# Patient Record
Sex: Male | Born: 1960
Health system: Southern US, Community
[De-identification: ages and names within clinical notes are randomized; demographics above are authoritative.]

## PROBLEM LIST (undated history)

## (undated) DIAGNOSIS — I712 Thoracic aortic aneurysm, without rupture: Secondary | ICD-10-CM

## (undated) DIAGNOSIS — N200 Calculus of kidney: Secondary | ICD-10-CM

## (undated) DIAGNOSIS — E785 Hyperlipidemia, unspecified: Secondary | ICD-10-CM

## (undated) DIAGNOSIS — I472 Ventricular tachycardia: Secondary | ICD-10-CM

## (undated) DIAGNOSIS — I7121 Aneurysm of the ascending aorta, without rupture: Secondary | ICD-10-CM

## (undated) DIAGNOSIS — R911 Solitary pulmonary nodule: Secondary | ICD-10-CM

## (undated) DIAGNOSIS — I4729 Other ventricular tachycardia: Secondary | ICD-10-CM

## (undated) DIAGNOSIS — I2510 Atherosclerotic heart disease of native coronary artery without angina pectoris: Secondary | ICD-10-CM

## (undated) DIAGNOSIS — N182 Chronic kidney disease, stage 2 (mild): Secondary | ICD-10-CM

## (undated) DIAGNOSIS — C61 Malignant neoplasm of prostate: Secondary | ICD-10-CM

## (undated) DIAGNOSIS — I5020 Unspecified systolic (congestive) heart failure: Secondary | ICD-10-CM

## (undated) DIAGNOSIS — I10 Essential (primary) hypertension: Secondary | ICD-10-CM

## (undated) HISTORY — PX: PROSTATE BIOPSY: SHX241

## (undated) HISTORY — PX: INGUINAL HERNIA REPAIR: SUR1180

## (undated) HISTORY — DX: Solitary pulmonary nodule: R91.1

## (undated) HISTORY — DX: Ventricular tachycardia: I47.2

## (undated) HISTORY — DX: Unspecified systolic (congestive) heart failure: I50.20

## (undated) HISTORY — DX: Other ventricular tachycardia: I47.29

## (undated) HISTORY — DX: Aneurysm of the ascending aorta, without rupture: I71.21

## (undated) HISTORY — PX: CYSTOSCOPY WITH URETEROSCOPY, STONE BASKETRY AND STENT PLACEMENT: SHX6378

## (undated) HISTORY — DX: Atherosclerotic heart disease of native coronary artery without angina pectoris: I25.10

## (undated) HISTORY — DX: Thoracic aortic aneurysm, without rupture: I71.2

---

## 2016-09-07 ENCOUNTER — Emergency Department (HOSPITAL_BASED_OUTPATIENT_CLINIC_OR_DEPARTMENT_OTHER): Payer: BLUE CROSS/BLUE SHIELD

## 2016-09-07 ENCOUNTER — Encounter (HOSPITAL_BASED_OUTPATIENT_CLINIC_OR_DEPARTMENT_OTHER): Payer: Self-pay | Admitting: Emergency Medicine

## 2016-09-07 ENCOUNTER — Emergency Department (HOSPITAL_BASED_OUTPATIENT_CLINIC_OR_DEPARTMENT_OTHER)
Admission: EM | Admit: 2016-09-07 | Discharge: 2016-09-08 | Disposition: A | Discharge status: Other Acute Inpatient Hospital | Payer: BLUE CROSS/BLUE SHIELD | Attending: Emergency Medicine | Admitting: Emergency Medicine

## 2016-09-07 DIAGNOSIS — I6782 Cerebral ischemia: Secondary | ICD-10-CM | POA: Insufficient documentation

## 2016-09-07 DIAGNOSIS — Z87891 Personal history of nicotine dependence: Secondary | ICD-10-CM | POA: Diagnosis not present

## 2016-09-07 DIAGNOSIS — R112 Nausea with vomiting, unspecified: Secondary | ICD-10-CM | POA: Diagnosis present

## 2016-09-07 DIAGNOSIS — I161 Hypertensive emergency: Secondary | ICD-10-CM | POA: Diagnosis not present

## 2016-09-07 HISTORY — DX: Essential (primary) hypertension: I10

## 2016-09-07 LAB — CBC WITH DIFFERENTIAL/PLATELET
BASOS ABS: 0 10*3/uL (ref 0.0–0.1)
BASOS PCT: 0 %
EOS PCT: 0 %
Eosinophils Absolute: 0 10*3/uL (ref 0.0–0.7)
HCT: 42.6 % (ref 39.0–52.0)
Hemoglobin: 15.3 g/dL (ref 13.0–17.0)
LYMPHS PCT: 8 %
Lymphs Abs: 1.1 10*3/uL (ref 0.7–4.0)
MCH: 34.2 pg — ABNORMAL HIGH (ref 26.0–34.0)
MCHC: 35.9 g/dL (ref 30.0–36.0)
MCV: 95.1 fL (ref 78.0–100.0)
MONO ABS: 0.5 10*3/uL (ref 0.1–1.0)
Monocytes Relative: 4 %
NEUTROS ABS: 12.7 10*3/uL — AB (ref 1.7–7.7)
Neutrophils Relative %: 88 %
PLATELETS: 316 10*3/uL (ref 150–400)
RBC: 4.48 MIL/uL (ref 4.22–5.81)
RDW: 12.8 % (ref 11.5–15.5)
WBC: 14.4 10*3/uL — AB (ref 4.0–10.5)

## 2016-09-07 LAB — COMPREHENSIVE METABOLIC PANEL
ALBUMIN: 4.5 g/dL (ref 3.5–5.0)
ALT: 12 U/L — AB (ref 17–63)
AST: 24 U/L (ref 15–41)
Alkaline Phosphatase: 67 U/L (ref 38–126)
Anion gap: 12 (ref 5–15)
BUN: 10 mg/dL (ref 6–20)
CHLORIDE: 100 mmol/L — AB (ref 101–111)
CO2: 25 mmol/L (ref 22–32)
CREATININE: 1.17 mg/dL (ref 0.61–1.24)
Calcium: 9.9 mg/dL (ref 8.9–10.3)
GFR calc Af Amer: >60 mL/min (ref >60)
GLUCOSE: 142 mg/dL — AB (ref 65–99)
POTASSIUM: 3.7 mmol/L (ref 3.5–5.1)
Sodium: 137 mmol/L (ref 135–145)
Total Bilirubin: 0.8 mg/dL (ref 0.3–1.2)
Total Protein: 8.6 g/dL — ABNORMAL HIGH (ref 6.5–8.1)

## 2016-09-07 LAB — TROPONIN I
Troponin I: 0.06 ng/mL (ref <0.03)
Troponin I: 0.1 ng/mL (ref <0.03)

## 2016-09-07 LAB — LIPASE, BLOOD: LIPASE: 20 U/L (ref 11–51)

## 2016-09-07 MED ORDER — NICARDIPINE HCL IN NACL 20-0.86 MG/200ML-% IV SOLN: 3.0000 mg/h | Freq: Once | INTRAVENOUS | Status: DC

## 2016-09-07 MED ORDER — NITROGLYCERIN 2 % TD OINT
1.0000 [in_us] | TOPICAL_OINTMENT | Freq: Once | TRANSDERMAL | Status: AC
  Administered 2016-09-07: 1 [in_us] via TOPICAL
  Filled 2016-09-07: qty 1

## 2016-09-07 MED ORDER — ONDANSETRON HCL 4 MG/2ML IJ SOLN
4.0000 mg | Freq: Once | INTRAMUSCULAR | Status: AC
  Administered 2016-09-07: 4 mg via INTRAVENOUS
  Filled 2016-09-07: qty 2

## 2016-09-07 MED ORDER — HEPARIN (PORCINE) IN NACL 100-0.45 UNIT/ML-% IJ SOLN
1000.0000 [IU]/h | INTRAMUSCULAR | Status: DC
  Administered 2016-09-07: 1000 [IU]/h via INTRAVENOUS
  Filled 2016-09-07: qty 250

## 2016-09-07 MED ORDER — HEPARIN SODIUM (PORCINE) 5000 UNIT/ML IJ SOLN
INTRAMUSCULAR | Status: AC
  Filled 2016-09-07: qty 1

## 2016-09-07 MED ORDER — HEPARIN BOLUS VIA INFUSION
4000.0000 [IU] | Freq: Once | INTRAVENOUS | Status: AC
  Administered 2016-09-07: 4000 [IU] via INTRAVENOUS

## 2016-09-07 MED ORDER — ONDANSETRON HCL 4 MG/2ML IJ SOLN
4.0000 mg | Freq: Once | INTRAMUSCULAR | Status: AC
  Administered 2016-09-07: 4 mg via INTRAVENOUS

## 2016-09-07 MED ORDER — ACETAMINOPHEN 325 MG PO TABS: 650.0000 mg | ORAL_TABLET | Freq: Once | ORAL | Status: DC

## 2016-09-07 MED ORDER — NITROGLYCERIN IN D5W 200-5 MCG/ML-% IV SOLN
0.0000 ug/min | Freq: Once | INTRAVENOUS | Status: AC
  Administered 2016-09-07: 5 ug/min via INTRAVENOUS
  Filled 2016-09-07: qty 250

## 2016-09-07 MED ORDER — ASPIRIN 81 MG PO CHEW
324.0000 mg | CHEWABLE_TABLET | Freq: Once | ORAL | Status: AC
  Administered 2016-09-07: 324 mg via ORAL
  Filled 2016-09-07: qty 4

## 2016-09-07 MED ORDER — NITROGLYCERIN 0.4 MG SL SUBL
0.4000 mg | SUBLINGUAL_TABLET | Freq: Once | SUBLINGUAL | Status: AC
  Administered 2016-09-07: 0.4 mg via SUBLINGUAL
  Filled 2016-09-07: qty 1

## 2016-09-07 MED ORDER — ONDANSETRON HCL 4 MG/2ML IJ SOLN
INTRAMUSCULAR | Status: AC
  Filled 2016-09-07: qty 2

## 2016-09-07 NOTE — ED Notes (Signed)
States n/v relieved.

## 2016-09-07 NOTE — ED Notes (Signed)
Patient is resting comfortably. 

## 2016-09-07 NOTE — ED Notes (Signed)
Patient denies pain.

## 2016-09-07 NOTE — ED Triage Notes (Signed)
Pt in c/o emesis all day, denies abd pain. No emesis at this time. Pt alert, interactive, ambulatory in NAD.

## 2016-09-07 NOTE — ED Provider Notes (Signed)
[place] DEPT [facility] Provider Note   CSN: [id number] Arrival date & time: 09/07/16  1900  By signing my name below, I, Arianna Nassar, attest that this documentation has been prepared under the direction and in the presence of Fatima Blank, MD.  Electronically Signed: Julien Nordmann, ED Scribe. 09/07/16. 8:16 PM.    History   Chief Complaint Chief Complaint  Patient presents with  . Emesis    The history is provided by the patient. No language interpreter was used.   HPI Comments: Darius Rivers is a 55 y.o. male who has a PMhx of HTN presents to the Emergency Department complaining of sudden onset, intermittent vomiting that started this morning. He has been having associated chills and nausea. He says the last time he had a full meal was yesterday and he tried to consume fluids earlier today but vomited it back up. Pt has also had high blood pressure recently. He has not taken his blood pressure medication because he ran out one week ago. Pt has not been complaint with checking his blood pressure and is unsure what his blood pressure normally runs. Denies dizziness, headache, abdominal pain, chest pain, shortness of breath, fever, diarrhea, abdominal distension, leg swelling, or visual disturbances. Further denies hx of DM and hx of MI.  Past Medical History:  Diagnosis Date  . Hypertension     There are no active problems to display for this patient.   History reviewed. No pertinent surgical history.     Home Medications    Prior to Admission medications   Not on File    Family History History reviewed. No pertinent family history.  Social History Social History  Substance Use Topics  . Smoking status: Former Research scientist (life sciences)  . Smokeless tobacco: Not on file  . Alcohol use Yes     Allergies   Patient has no known allergies.   Review of Systems Review of Systems  All other systems reviewed and are negative.   A complete 10 system review of systems  was obtained and all systems are negative except as noted in the HPI and PMH.   Physical Exam Triage Vital Signs BP (!) 237/126 Comment: Pt states did not take BP meds this am due to vomiting  Pulse 65   Temp 98.3 F (36.8 C)   Resp (!) 32   Wt 180 lb (81.6 kg)   SpO2 100%   Recent vitals taken: Blood pressure (!) 247/109, pulse 70, temperature 98.3 F (36.8 C), resp. rate 21, weight 180 lb (81.6 kg), SpO2 100 %.   Physical Exam  Constitutional: He is oriented to person, place, and time. He appears well-developed and well-nourished. No distress.  HENT:  Head: Normocephalic and atraumatic.  Nose: Nose normal.  Eyes: Conjunctivae and EOM are normal. Pupils are equal, round, and reactive to light. Right eye exhibits no discharge. Left eye exhibits no discharge. No scleral icterus.  Neck: Normal range of motion. Neck supple.  Cardiovascular: Normal rate and regular rhythm.  Exam reveals no gallop and no friction rub.   No murmur heard. Pulmonary/Chest: Effort normal and breath sounds normal. No stridor. No respiratory distress. He has no rales.  Abdominal: Soft. He exhibits no distension. There is no tenderness.  Musculoskeletal: He exhibits no edema or tenderness.  Neurological: He is alert and oriented to person, place, and time. He displays normal reflexes.  Mental Status: Alert and oriented to person, place, and time. Attention and concentration normal. Speech clear. Recent memory is intac  Cranial Nerves  II Visual Fields: Intact to confrontation. Visual fields intact. III, IV, VI: Pupils equal and reactive to light and near. Full eye movement without nystagmus  V Facial Sensation: Normal. No weakness of masticatory muscles  VII: No facial weakness or asymmetry  VIII Auditory Acuity: Grossly normal  IX/X: The uvula is midline; the palate elevates symmetrically  XI: Normal sternocleidomastoid and trapezius strength  XII: The tongue is midline. No atrophy or fasciculations.    Motor System: Muscle Strength: 5/5 and symmetric in the upper and lower extremities. No pronation or drift.  Muscle Tone: Tone and muscle bulk are normal in the upper and lower extremities.   Reflexes: DTRs: 2+ and symmetrical in all four extremities. Plantar responses are flexor bilaterally.  Coordination: Intact finger-to-nose, heel-to-shin, and rapid alternating movements. No tremor.  Sensation: Intact to light touch, and pinprick. Negative Romberg test.    Skin: Skin is warm and dry. No rash noted. He is not diaphoretic. No erythema.  Psychiatric: He has a normal mood and affect.  Nursing note and vitals reviewed.    ED Treatments / Results  DIAGNOSTIC STUDIES: Oxygen Saturation is 100% on RA, normal by my interpretation.  COORDINATION OF CARE:  8:13 PM Discussed treatment plan with pt at bedside and pt agreed to plan.  Labs (all labs ordered are listed, but only abnormal results are displayed) Labs Reviewed  CBC WITH DIFFERENTIAL/PLATELET - Abnormal; Notable for the following:       Result Value   WBC 14.4 (*)    MCH 34.2 (*)    Neutro Abs 12.7 (*)    All other components within normal limits  COMPREHENSIVE METABOLIC PANEL - Abnormal; Notable for the following:    Chloride 100 (*)    Glucose, Bld 142 (*)    Total Protein 8.6 (*)    ALT 12 (*)    All other components within normal limits  TROPONIN I - Abnormal; Notable for the following:    Troponin I 0.06 (*)    All other components within normal limits  TROPONIN I - Abnormal; Notable for the following:    Troponin I 0.10 (*)    All other components within normal limits  LIPASE, BLOOD    EKG  EKG Interpretation  Date/Time:  Sunday September 07 2016 20:19:05 EST Ventricular Rate:  68 PR Interval:    QRS Duration: 116 QT Interval:  447 QTC Calculation: 476 R Axis:   -58 Text Interpretation:  Sinus rhythm Probable left atrial enlargement Incomplete right bundle branch block LVH with IVCD, LAD and secondary  repol abnrm Anterior ST elevation, probably due to LVH Borderline prolonged QT interval No old tracing to compare Confirmed by [facility] MD, Turkessa Ostrom [phone]) on 09/07/2016 10:14:28 PM       Radiology Ct Head Wo Contrast  Result Date: 09/07/2016 CLINICAL DATA:  Hypertension, emesis all day EXAM: CT HEAD WITHOUT CONTRAST TECHNIQUE: Contiguous axial images were obtained from the base of the skull through the vertex without intravenous contrast. COMPARISON:  None. FINDINGS: Study had to be repeated several times due to patient motion causing artifacts. Brain: Pearline Cables- white matter distinction is maintained without acute intracranial hemorrhage, midline shift or edema. Ventricles are normal for age. There appears to be minimal small vessel ischemic change of periventricular white matter and probable centrum semiovale lacunar infarct on the left versus a small CSF space. Vascular: No hyperdense vessel or unexpected calcification. Skull: Normal. Negative for fracture or focal lesion. Sinuses/Orbits: Mucous retention cysts noted of  the right maxillary sinus. The orbits appear symmetric without retrobulbar abnormalities. Other: None IMPRESSION: Limited study due to patient motion. No acute intracranial abnormality identified. Likely chronic small vessel ischemic changes of periventricular white matter. Electronically Signed   By: Ashley Royalty M.D.   On: 09/07/2016 23:11    Procedures Procedures (including critical care time) CRITICAL CARE Performed by: Grayce Sessions Chamberlain Steinborn Total critical care time: 35 minutes Critical care time was exclusive of separately billable procedures and treating other patients. Critical care was necessary to treat or prevent imminent or life-threatening deterioration. Critical care was time spent personally by me on the following activities: development of treatment plan with patient and/or surrogate as well as nursing, discussions with consultants, evaluation of patient's response to  treatment, examination of patient, obtaining history from patient or surrogate, ordering and performing treatments and interventions, ordering and review of laboratory studies, ordering and review of radiographic studies, pulse oximetry and re-evaluation of patient's condition.   Medications Ordered in ED Medications  ondansetron (ZOFRAN) 4 MG/2ML injection (not administered)  heparin ADULT infusion 100 units/mL (25000 units/254mL sodium chloride 0.45%) (1,000 Units/hr Intravenous Transfusing/Transfer 09/08/16 0013)  heparin 5000 UNIT/ML injection (not administered)  nicardipine (CARDENE) 20mg  in 0.86% saline 28ml IV infusion (0.1 mg/ml) (not administered)  ondansetron (ZOFRAN) injection 4 mg (4 mg Intravenous Given 09/07/16 2059)  aspirin chewable tablet 324 mg (324 mg Oral Given 09/07/16 2113)  nitroGLYCERIN (NITROSTAT) SL tablet 0.4 mg (0.4 mg Sublingual Given 09/07/16 2114)  nitroGLYCERIN (NITROGLYN) 2 % ointment 1 inch (1 inch Topical Given 09/07/16 2114)  nitroGLYCERIN 50 mg in dextrose 5 % 250 mL (0.2 mg/mL) infusion (140 mcg/min Intravenous Transfusing/Transfer 09/08/16 0012)  heparin bolus via infusion 4,000 Units (4,000 Units Intravenous Bolus from Bag 09/07/16 2334)  ondansetron (ZOFRAN) injection 4 mg (4 mg Intravenous Given 09/07/16 2304)     Initial Impression / Assessment and Plan / ED Course  I have reviewed the triage vital signs and the nursing notes.  Pertinent labs & imaging results that were available during my care of the patient were reviewed by me and considered in my medical decision making (see chart for details).  Clinical Course     Hypertensive emergency. Nitroglycerin drip initiated. The pressures slowly trending down however is still requires titration. Given the elevated troponins, 324 mg of aspirin given. CT head without acute hemorrhagic stroke. heparin drip initiated.  Patient requested to be admitted to [facility]. Discussed case with the hospitalist  who will admit the patient to intensive care unit.  Final Clinical Impressions(s) / ED Diagnoses   Final diagnoses:  Hypertensive emergency  Non-intractable vomiting with nausea, unspecified vomiting type    Disposition: Transfer  Condition: serious    Fatima Blank, MD 09/08/16 [phone]

## 2016-09-07 NOTE — Progress Notes (Signed)
ANTICOAGULATION CONSULT NOTE - Initial Consult  Pharmacy Consult for Heparin Indication: chest pain/ACS  No Known Allergies  Patient Measurements: Weight: 180 lb (81.6 kg) Heparin Dosing Weight: 82 kg  Vital Signs: Temp: 98.3 F (36.8 C) (11/05 1904) BP: 251/118 (11/05 2202) Pulse Rate: 64 (11/05 2145)  Labs:  Recent Labs  09/07/16 2010  HGB 15.3  HCT 42.6  PLT 316  CREATININE 1.17  TROPONINI 0.06*    CrCl cannot be calculated (Unknown ideal weight.).   Medical History: Past Medical History:  Diagnosis Date  . Hypertension     Medications:  Awaiting home med rec  Assessment: 55 y.o. M presents to [facility] with emesis. Pt with elevated trop to 0.06. To begin heparin for r/o ACS. No AC PTA. CBC ok on admission.  Goal of Therapy:  Heparin level 0.3-0.7 units/ml Monitor platelets by anticoagulation protocol: Yes   Plan:  Heparin IV bolus 4000 units Heparin gtt at 1000 units/hr Will f/u heparin level in 6 hours Daily heparin level and CBC  Sherlon Handing, PharmD, BCPS Clinical pharmacist, pager [phone] 09/07/2016,10:17 PM

## 2016-09-07 NOTE — ED Notes (Signed)
Nitro gtt increased to 73mcg/min, 18ml/hr

## 2016-09-07 NOTE — ED Notes (Signed)
Nitropaste removed from chest.

## 2016-09-07 NOTE — ED Notes (Signed)
Denies pain, states has not taken BP med in a week?, has ran out . N/V today

## 2016-09-07 NOTE — ED Notes (Addendum)
MD aware that there was an increase in pt's troponin. No further orders received. (Troponin 0.10)

## 2016-09-07 NOTE — ED Notes (Signed)
MD aware of positive troponin

## 2016-09-07 NOTE — ED Notes (Signed)
Back to room from Ct, with nurse on cardiac monitor

## 2016-09-08 NOTE — ED Notes (Signed)
Pt left with [facility].  No acute distress noted.  Pt denies HA, nausea.

## 2018-11-05 ENCOUNTER — Encounter (HOSPITAL_BASED_OUTPATIENT_CLINIC_OR_DEPARTMENT_OTHER): Payer: Self-pay

## 2018-11-05 ENCOUNTER — Emergency Department (HOSPITAL_BASED_OUTPATIENT_CLINIC_OR_DEPARTMENT_OTHER)
Admission: EM | Admit: 2018-11-05 | Discharge: 2018-11-05 | Disposition: A | Discharge status: Home / Self Care | Payer: BLUE CROSS/BLUE SHIELD | Attending: Emergency Medicine | Admitting: Emergency Medicine

## 2018-11-05 ENCOUNTER — Other Ambulatory Visit: Payer: Self-pay

## 2018-11-05 DIAGNOSIS — R112 Nausea with vomiting, unspecified: Secondary | ICD-10-CM | POA: Diagnosis not present

## 2018-11-05 DIAGNOSIS — Z87891 Personal history of nicotine dependence: Secondary | ICD-10-CM | POA: Insufficient documentation

## 2018-11-05 DIAGNOSIS — R197 Diarrhea, unspecified: Secondary | ICD-10-CM | POA: Diagnosis not present

## 2018-11-05 DIAGNOSIS — I10 Essential (primary) hypertension: Secondary | ICD-10-CM | POA: Diagnosis not present

## 2018-11-05 LAB — URINALYSIS, ROUTINE W REFLEX MICROSCOPIC
Bilirubin Urine: NEGATIVE
Glucose, UA: NEGATIVE mg/dL
Ketones, ur: 15 mg/dL — AB
Leukocytes, UA: NEGATIVE
Nitrite: NEGATIVE
Protein, ur: NEGATIVE mg/dL
Specific Gravity, Urine: 1.015 (ref 1.005–1.030)
pH: 7.5 (ref 5.0–8.0)

## 2018-11-05 LAB — CBC WITH DIFFERENTIAL/PLATELET
Abs Immature Granulocytes: 0.04 10*3/uL (ref 0.00–0.07)
Basophils Absolute: 0 10*3/uL (ref 0.0–0.1)
Basophils Relative: 0 %
Eosinophils Absolute: 0 10*3/uL (ref 0.0–0.5)
Eosinophils Relative: 0 %
HCT: 38.2 % — ABNORMAL LOW (ref 39.0–52.0)
Hemoglobin: 12.8 g/dL — ABNORMAL LOW (ref 13.0–17.0)
Immature Granulocytes: 0 %
Lymphocytes Relative: 17 %
Lymphs Abs: 1.6 10*3/uL (ref 0.7–4.0)
MCH: 33.2 pg (ref 26.0–34.0)
MCHC: 33.5 g/dL (ref 30.0–36.0)
MCV: 99 fL (ref 80.0–100.0)
Monocytes Absolute: 0.5 10*3/uL (ref 0.1–1.0)
Monocytes Relative: 5 %
Neutro Abs: 7.6 10*3/uL (ref 1.7–7.7)
Neutrophils Relative %: 78 %
Platelets: 294 10*3/uL (ref 150–400)
RBC: 3.86 MIL/uL — ABNORMAL LOW (ref 4.22–5.81)
RDW: 12.8 % (ref 11.5–15.5)
WBC: 9.8 10*3/uL (ref 4.0–10.5)
nRBC: 0 % (ref 0.0–0.2)

## 2018-11-05 LAB — COMPREHENSIVE METABOLIC PANEL
ALT: 13 U/L (ref 0–44)
AST: 17 U/L (ref 15–41)
Albumin: 4.1 g/dL (ref 3.5–5.0)
Alkaline Phosphatase: 59 U/L (ref 38–126)
Anion gap: 9 (ref 5–15)
BUN: 11 mg/dL (ref 6–20)
CO2: 21 mmol/L — ABNORMAL LOW (ref 22–32)
Calcium: 9.5 mg/dL (ref 8.9–10.3)
Chloride: 108 mmol/L (ref 98–111)
Creatinine, Ser: 1.01 mg/dL (ref 0.61–1.24)
GFR calc Af Amer: >60 mL/min (ref >60)
GFR calc non Af Amer: >60 mL/min (ref >60)
Glucose, Bld: 136 mg/dL — ABNORMAL HIGH (ref 70–99)
Potassium: 3.1 mmol/L — ABNORMAL LOW (ref 3.5–5.1)
Sodium: 138 mmol/L (ref 135–145)
Total Bilirubin: 0.7 mg/dL (ref 0.3–1.2)
Total Protein: 7.9 g/dL (ref 6.5–8.1)

## 2018-11-05 LAB — URINALYSIS, MICROSCOPIC (REFLEX)

## 2018-11-05 LAB — LIPASE, BLOOD: Lipase: 23 U/L (ref 11–51)

## 2018-11-05 MED ORDER — ONDANSETRON HCL 4 MG/2ML IJ SOLN
4.0000 mg | Freq: Once | INTRAMUSCULAR | Status: AC
  Administered 2018-11-05: 4 mg via INTRAVENOUS
  Filled 2018-11-05: qty 2

## 2018-11-05 MED ORDER — PROMETHAZINE HCL 25 MG PO TABS: 25.0000 mg | ORAL_TABLET | Freq: Four times a day (QID) | ORAL | 0 refills | Status: DC | PRN

## 2018-11-05 MED ORDER — SODIUM CHLORIDE 0.9 % IV BOLUS
1000.0000 mL | Freq: Once | INTRAVENOUS | Status: AC
  Administered 2018-11-05: 1000 mL via INTRAVENOUS

## 2018-11-05 NOTE — Discharge Instructions (Addendum)
Return here as needed. Follow up with your doctor. Slowly increase your fluid intake. °

## 2018-11-05 NOTE — ED Provider Notes (Signed)
[place] EMERGENCY DEPARTMENT Provider Note   CSN: [id number] Arrival date & time: 11/05/18  1243     History   Chief Complaint Chief Complaint  Patient presents with  . Abdominal Pain    HPI Darius Rivers is a 58 y.o. male.  HPI Patient presents to the emergency department with nausea vomiting diarrhea that started last night.  The patient states that he not take any medication prior to arrival for symptoms.  Patient states that feels like he vomited up his home medications.  Patient states that nothing seems to make the condition better or worse.  Patient does not have abdominal pain despite with the nursing triage note states.  The patient denies chest pain, shortness of breath, headache,blurred vision, neck pain, fever, cough, weakness, numbness, dizziness, anorexia, edema, abdominal pain,  rash, back pain, dysuria, hematemesis, bloody stool, near syncope, or syncope. Past Medical History:  Diagnosis Date  . Hypertension     There are no active problems to display for this patient.   History reviewed. No pertinent surgical history.      Home Medications    Prior to Admission medications   Not on File    Family History No family history on file.  Social History Social History   Tobacco Use  . Smoking status: Former Research scientist (life sciences)  . Smokeless tobacco: Never Used  Substance Use Topics  . Alcohol use: Yes    Comment: occ  . Drug use: Never     Allergies   Patient has no known allergies.   Review of Systems Review of Systems All other systems negative except as documented in the HPI. All pertinent positives and negatives as reviewed in the HPI.  Physical Exam Updated Vital Signs BP (!) 180/86 (BP Location: Left Arm)   Pulse (!) 56   Temp 98.7 F (37.1 C) (Oral)   Resp 20   Ht 6\' 4"  (1.93 m)   Wt 83.5 kg   SpO2 100%   BMI 22.40 kg/m   Physical Exam Vitals signs and nursing note reviewed.  Constitutional:      General: He is not in  acute distress.    Appearance: He is well-developed.  HENT:     Head: Normocephalic and atraumatic.  Eyes:     Pupils: Pupils are equal, round, and reactive to light.  Neck:     Musculoskeletal: Normal range of motion and neck supple.  Cardiovascular:     Rate and Rhythm: Normal rate and regular rhythm.     Heart sounds: Normal heart sounds. No murmur. No friction rub. No gallop.   Pulmonary:     Effort: Pulmonary effort is normal. No respiratory distress.     Breath sounds: Normal breath sounds. No wheezing.  Abdominal:     General: Bowel sounds are normal. There is no distension.     Palpations: Abdomen is soft.     Tenderness: There is no abdominal tenderness.     Hernia: No hernia is present.  Skin:    General: Skin is warm and dry.     Capillary Refill: Capillary refill takes less than 2 seconds.     Findings: No erythema or rash.  Neurological:     Mental Status: He is alert and oriented to person, place, and time.     Motor: No abnormal muscle tone.     Coordination: Coordination normal.  Psychiatric:        Behavior: Behavior normal.      ED Treatments / Results  Labs (all labs ordered are listed, but only abnormal results are displayed) Labs Reviewed  URINALYSIS, ROUTINE W REFLEX MICROSCOPIC - Abnormal; Notable for the following components:      Result Value   Hgb urine dipstick MODERATE (*)    Ketones, ur 15 (*)    All other components within normal limits  COMPREHENSIVE METABOLIC PANEL - Abnormal; Notable for the following components:   Potassium 3.1 (*)    CO2 21 (*)    Glucose, Bld 136 (*)    All other components within normal limits  CBC WITH DIFFERENTIAL/PLATELET - Abnormal; Notable for the following components:   RBC 3.86 (*)    Hemoglobin 12.8 (*)    HCT 38.2 (*)    All other components within normal limits  URINALYSIS, MICROSCOPIC (REFLEX) - Abnormal; Notable for the following components:   Bacteria, UA RARE (*)    All other components within  normal limits  LIPASE, BLOOD    EKG None  Radiology No results found.  Procedures Procedures (including critical care time)  Medications Ordered in ED Medications  sodium chloride 0.9 % bolus 1,000 mL (0 mLs Intravenous Stopped 11/05/18 1658)  ondansetron (ZOFRAN) injection 4 mg (4 mg Intravenous Given 11/05/18 1406)     Initial Impression / Assessment and Plan / ED Course  I have reviewed the triage vital signs and the nursing notes.  Pertinent labs & imaging results that were available during my care of the patient were reviewed by me and considered in my medical decision making (see chart for details).     Patient be treated for gastroenteritis told return here as needed.  Patient agrees the plan and all questions were answered.  Patient has tolerated oral fluids here in the emergency department he received IV fluids and antiemetics which he states is resolved his nausea and vomiting.  Final Clinical Impressions(s) / ED Diagnoses   Final diagnoses:  None    ED Discharge Orders    None       Rebeca Allegra 11/05/18 [place], MD 11/07/18 [phone]

## 2018-11-05 NOTE — ED Notes (Signed)
Pt given sprite for PO challenge

## 2018-11-05 NOTE — ED Notes (Signed)
Pt reports he feels better from medication. No longer feels nauseous.

## 2018-11-05 NOTE — ED Triage Notes (Signed)
C/o abd pain, n/v day2-NAD-steady slow gait

## 2018-11-10 ENCOUNTER — Emergency Department (HOSPITAL_BASED_OUTPATIENT_CLINIC_OR_DEPARTMENT_OTHER): Payer: BLUE CROSS/BLUE SHIELD

## 2018-11-10 ENCOUNTER — Inpatient Hospital Stay (HOSPITAL_BASED_OUTPATIENT_CLINIC_OR_DEPARTMENT_OTHER)
Admission: EM | Admit: 2018-11-10 | Discharge: 2018-11-15 | DRG: 682 | Disposition: A | Discharge status: Home / Self Care | Payer: BLUE CROSS/BLUE SHIELD | Attending: Internal Medicine | Admitting: Internal Medicine

## 2018-11-10 ENCOUNTER — Other Ambulatory Visit: Payer: Self-pay

## 2018-11-10 ENCOUNTER — Encounter (HOSPITAL_BASED_OUTPATIENT_CLINIC_OR_DEPARTMENT_OTHER): Payer: Self-pay

## 2018-11-10 DIAGNOSIS — I351 Nonrheumatic aortic (valve) insufficiency: Secondary | ICD-10-CM | POA: Diagnosis present

## 2018-11-10 DIAGNOSIS — E1122 Type 2 diabetes mellitus with diabetic chronic kidney disease: Secondary | ICD-10-CM | POA: Diagnosis present

## 2018-11-10 DIAGNOSIS — N183 Chronic kidney disease, stage 3 unspecified: Secondary | ICD-10-CM

## 2018-11-10 DIAGNOSIS — I471 Supraventricular tachycardia: Secondary | ICD-10-CM | POA: Diagnosis present

## 2018-11-10 DIAGNOSIS — Z87442 Personal history of urinary calculi: Secondary | ICD-10-CM

## 2018-11-10 DIAGNOSIS — I5020 Unspecified systolic (congestive) heart failure: Secondary | ICD-10-CM

## 2018-11-10 DIAGNOSIS — I712 Thoracic aortic aneurysm, without rupture: Secondary | ICD-10-CM | POA: Diagnosis present

## 2018-11-10 DIAGNOSIS — I428 Other cardiomyopathies: Secondary | ICD-10-CM | POA: Diagnosis present

## 2018-11-10 DIAGNOSIS — N179 Acute kidney failure, unspecified: Principal | ICD-10-CM | POA: Diagnosis present

## 2018-11-10 DIAGNOSIS — Z87891 Personal history of nicotine dependence: Secondary | ICD-10-CM

## 2018-11-10 DIAGNOSIS — I9589 Other hypotension: Secondary | ICD-10-CM

## 2018-11-10 DIAGNOSIS — R7989 Other specified abnormal findings of blood chemistry: Secondary | ICD-10-CM

## 2018-11-10 DIAGNOSIS — I472 Ventricular tachycardia: Secondary | ICD-10-CM

## 2018-11-10 DIAGNOSIS — I951 Orthostatic hypotension: Secondary | ICD-10-CM | POA: Diagnosis present

## 2018-11-10 DIAGNOSIS — E861 Hypovolemia: Secondary | ICD-10-CM

## 2018-11-10 DIAGNOSIS — I5022 Chronic systolic (congestive) heart failure: Secondary | ICD-10-CM | POA: Diagnosis present

## 2018-11-10 DIAGNOSIS — N200 Calculus of kidney: Secondary | ICD-10-CM | POA: Diagnosis present

## 2018-11-10 DIAGNOSIS — I2510 Atherosclerotic heart disease of native coronary artery without angina pectoris: Secondary | ICD-10-CM | POA: Diagnosis present

## 2018-11-10 DIAGNOSIS — E872 Acidosis, unspecified: Secondary | ICD-10-CM

## 2018-11-10 DIAGNOSIS — I5042 Chronic combined systolic (congestive) and diastolic (congestive) heart failure: Secondary | ICD-10-CM

## 2018-11-10 DIAGNOSIS — I714 Abdominal aortic aneurysm, without rupture: Secondary | ICD-10-CM | POA: Diagnosis present

## 2018-11-10 DIAGNOSIS — Z8546 Personal history of malignant neoplasm of prostate: Secondary | ICD-10-CM

## 2018-11-10 DIAGNOSIS — R42 Dizziness and giddiness: Secondary | ICD-10-CM | POA: Diagnosis not present

## 2018-11-10 DIAGNOSIS — R778 Other specified abnormalities of plasma proteins: Secondary | ICD-10-CM

## 2018-11-10 DIAGNOSIS — I959 Hypotension, unspecified: Secondary | ICD-10-CM

## 2018-11-10 DIAGNOSIS — I4729 Other ventricular tachycardia: Secondary | ICD-10-CM

## 2018-11-10 DIAGNOSIS — E785 Hyperlipidemia, unspecified: Secondary | ICD-10-CM | POA: Diagnosis present

## 2018-11-10 DIAGNOSIS — I130 Hypertensive heart and chronic kidney disease with heart failure and stage 1 through stage 4 chronic kidney disease, or unspecified chronic kidney disease: Secondary | ICD-10-CM | POA: Diagnosis present

## 2018-11-10 DIAGNOSIS — R571 Hypovolemic shock: Secondary | ICD-10-CM | POA: Diagnosis present

## 2018-11-10 DIAGNOSIS — E871 Hypo-osmolality and hyponatremia: Secondary | ICD-10-CM | POA: Diagnosis present

## 2018-11-10 DIAGNOSIS — E876 Hypokalemia: Secondary | ICD-10-CM | POA: Diagnosis present

## 2018-11-10 HISTORY — DX: Malignant neoplasm of prostate: C61

## 2018-11-10 HISTORY — DX: Hyperlipidemia, unspecified: E78.5

## 2018-11-10 HISTORY — DX: Thoracic aortic aneurysm, without rupture: I71.2

## 2018-11-10 HISTORY — DX: Calculus of kidney: N20.0

## 2018-11-10 HISTORY — DX: Unspecified systolic (congestive) heart failure: I50.20

## 2018-11-10 HISTORY — DX: Chronic kidney disease, stage 2 (mild): N18.2

## 2018-11-10 LAB — COMPREHENSIVE METABOLIC PANEL
ALT: 12 U/L (ref 0–44)
AST: 16 U/L (ref 15–41)
Albumin: 4.1 g/dL (ref 3.5–5.0)
Alkaline Phosphatase: 53 U/L (ref 38–126)
Anion gap: 13 (ref 5–15)
BUN: 61 mg/dL — ABNORMAL HIGH (ref 6–20)
CALCIUM: 9.2 mg/dL (ref 8.9–10.3)
CO2: 22 mmol/L (ref 22–32)
Chloride: 96 mmol/L — ABNORMAL LOW (ref 98–111)
Creatinine, Ser: 4.78 mg/dL — ABNORMAL HIGH (ref 0.61–1.24)
GFR calc non Af Amer: 13 mL/min — ABNORMAL LOW (ref >60)
GFR, EST AFRICAN AMERICAN: 15 mL/min — AB (ref >60)
Glucose, Bld: 132 mg/dL — ABNORMAL HIGH (ref 70–99)
Potassium: 2.9 mmol/L — ABNORMAL LOW (ref 3.5–5.1)
SODIUM: 131 mmol/L — AB (ref 135–145)
Total Bilirubin: 0.6 mg/dL (ref 0.3–1.2)
Total Protein: 7.6 g/dL (ref 6.5–8.1)

## 2018-11-10 LAB — TROPONIN I: Troponin I: 1.57 ng/mL (ref <0.03)

## 2018-11-10 LAB — CBC WITH DIFFERENTIAL/PLATELET
Abs Immature Granulocytes: 0.07 10*3/uL (ref 0.00–0.07)
BASOS ABS: 0 10*3/uL (ref 0.0–0.1)
Basophils Relative: 0 %
Eosinophils Absolute: 0.2 10*3/uL (ref 0.0–0.5)
Eosinophils Relative: 2 %
HCT: 41.5 % (ref 39.0–52.0)
Hemoglobin: 13.8 g/dL (ref 13.0–17.0)
Immature Granulocytes: 1 %
Lymphocytes Relative: 19 %
Lymphs Abs: 2.8 10*3/uL (ref 0.7–4.0)
MCH: 33.3 pg (ref 26.0–34.0)
MCHC: 33.3 g/dL (ref 30.0–36.0)
MCV: 100 fL (ref 80.0–100.0)
Monocytes Absolute: 1 10*3/uL (ref 0.1–1.0)
Monocytes Relative: 7 %
NRBC: 0 % (ref 0.0–0.2)
Neutro Abs: 10.8 10*3/uL — ABNORMAL HIGH (ref 1.7–7.7)
Neutrophils Relative %: 71 %
Platelets: 388 10*3/uL (ref 150–400)
RBC: 4.15 MIL/uL — ABNORMAL LOW (ref 4.22–5.81)
RDW: 12.8 % (ref 11.5–15.5)
WBC: 15 10*3/uL — ABNORMAL HIGH (ref 4.0–10.5)

## 2018-11-10 LAB — I-STAT CG4 LACTIC ACID, ED: LACTIC ACID, VENOUS: 1.57 mmol/L (ref 0.5–1.9)

## 2018-11-10 MED ORDER — SODIUM CHLORIDE 0.9 % IV BOLUS
2000.0000 mL | Freq: Once | INTRAVENOUS | Status: AC
  Administered 2018-11-10: 2000 mL via INTRAVENOUS

## 2018-11-10 NOTE — ED Notes (Signed)
ED Provider at bedside. 

## 2018-11-10 NOTE — ED Provider Notes (Signed)
[place] EMERGENCY DEPARTMENT Provider Note   CSN: [id number] Arrival date & time: 11/10/18  2217     History   Chief Complaint Chief Complaint  Patient presents with  . Dizziness    HPI Darius Rivers is a 58 y.o. male.  The history is provided by the patient. No language interpreter was used.  Dizziness   Darius Rivers is a 58 y.o. male who presents to the Emergency Department complaining of dizziness. He presents to the emergency department for evaluation of dizziness that began about three days ago. Level V caveat due to confusion. He states that he was here three days ago for nausea, vomiting, diarrhea. Overall though symptoms are gone but he has been feeling dizzy and lightheaded since that time. Symptoms have progressively worsened since then. He has mild associated shortness of breath. He denies any headache, chest pain, cough, abdominal pain, hematochezia, melena, hematemesis. He does have a history of hypertension and has been compliant with his medications.  He is unclear what medications he takes. Past Medical History:  Diagnosis Date  . Hypertension     Patient Active Problem List   Diagnosis Date Noted  . AKI (acute kidney injury) ([place]) 11/11/2018    History reviewed. No pertinent surgical history.      Home Medications    Prior to Admission medications   Medication Sig Start Date End Date Taking? Authorizing Provider  amLODipine (NORVASC) 10 MG tablet TAKE ONE TABLET BY MOUTH DAILY 11/04/18  Yes [provider]  carvedilol (COREG) 12.5 MG tablet TAKE 1 TABLET BY MOUTH 2 TIMES DAILY WITH MEALS 04/13/17  Yes [provider]  furosemide (LASIX) 40 MG tablet Take by mouth. 09/15/16  Yes [provider]  lisinopril (PRINIVIL,ZESTRIL) 40 MG tablet TAKE ONE TABLET BY MOUTH DAILY 09/27/18  Yes [provider]  rosuvastatin (CRESTOR) 5 MG tablet Take by mouth. 04/13/17 04/02/19 Yes [provider]  Vitamin  D, Ergocalciferol, (DRISDOL) 1.25 MG (50000 UT) CAPS capsule TAKE ONE CAPSULE BY MOUTH ONCE WEEKLY 10/05/17  Yes [provider]  promethazine (PHENERGAN) 25 MG tablet Take 1 tablet (25 mg total) by mouth every 6 (six) hours as needed for nausea or vomiting. 11/05/18   Lawyer, Harrell Gave, PA-C    Family History No family history on file.  Social History Social History   Tobacco Use  . Smoking status: Former Research scientist (life sciences)  . Smokeless tobacco: Never Used  Substance Use Topics  . Alcohol use: Yes    Comment: occ  . Drug use: Never     Allergies   Patient has no known allergies.   Review of Systems Review of Systems  Neurological: Positive for dizziness.  All other systems reviewed and are negative.    Physical Exam Updated Vital Signs BP 110/77   Pulse 71   Temp 97.7 F (36.5 C) (Oral)   Resp (!) 22   SpO2 100%   Physical Exam Vitals signs and nursing note reviewed.  Constitutional:      Appearance: He is well-developed.  HENT:     Head: Normocephalic and atraumatic.  Cardiovascular:     Rate and Rhythm: Normal rate and regular rhythm.     Heart sounds: No murmur.  Pulmonary:     Effort: Pulmonary effort is normal. No respiratory distress.     Breath sounds: Normal breath sounds.  Abdominal:     Palpations: Abdomen is soft.     Tenderness: There is no abdominal tenderness. There is no guarding or  rebound.  Musculoskeletal:        General: No swelling or tenderness.  Skin:    General: Skin is warm and dry.  Neurological:     Mental Status: He is alert.     Comments: Mildly confused.  Profound generalized weakness.  Oriented to place and time.    Psychiatric:        Behavior: Behavior normal.      ED Treatments / Results  Labs (all labs ordered are listed, but only abnormal results are displayed) Labs Reviewed  COMPREHENSIVE METABOLIC PANEL - Abnormal; Notable for the following components:      Result Value   Sodium 131 (*)    Potassium 2.9 (*)      Chloride 96 (*)    Glucose, Bld 132 (*)    BUN 61 (*)    Creatinine, Ser 4.78 (*)    GFR calc non Af Amer 13 (*)    GFR calc Af Amer 15 (*)    All other components within normal limits  CBC WITH DIFFERENTIAL/PLATELET - Abnormal; Notable for the following components:   WBC 15.0 (*)    RBC 4.15 (*)    Neutro Abs 10.8 (*)    All other components within normal limits  TROPONIN I - Abnormal; Notable for the following components:   Troponin I 1.57 (*)    All other components within normal limits  CULTURE, BLOOD (ROUTINE X 2)  CULTURE, BLOOD (ROUTINE X 2)  MAGNESIUM  I-STAT CG4 LACTIC ACID, ED  I-STAT CG4 LACTIC ACID, ED    EKG EKG Interpretation  Date/Time:  Wednesday November 10 2018 22:31:34 EST Ventricular Rate:  67 PR Interval:    QRS Duration: 115 QT Interval:  439 QTC Calculation: 464 R Axis:   -13 Text Interpretation:  Sinus rhythm Consider left atrial enlargement LVH with IVCD and secondary repol abnrm Baseline wander in lead(s) I II III aVL aVF V1 V2 V4 V5 V6 Confirmed by Quintella Reichert [phone]) on 11/10/2018 10:39:10 PM   Radiology Dg Chest Port 1 View  Result Date: 11/10/2018 CLINICAL DATA:  Dizziness and dyspnea since 11/07/2017 EXAM: PORTABLE CHEST 1 VIEW COMPARISON:  CT 09/09/2016, CXR 09/08/2016 FINDINGS: The cardiopericardial silhouette is within normal limits. Mild prominence of the ascending portion of the thoracic aorta common consistent with known ascending thoracic aortic aneurysm. Both lungs are clear. The visualized skeletal structures are unremarkable. IMPRESSION: No active disease. Stable mild prominence of the mediastinum secondary to known ascending thoracic aortic aneurysm without change. Electronically Signed   By: Ashley Royalty M.D.   On: 11/10/2018 23:14    Procedures Procedures (including critical care time) CRITICAL CARE Performed by: Quintella Reichert   Total critical care time: 45 minutes  Critical care time was exclusive of separately billable  procedures and treating other patients.  Critical care was necessary to treat or prevent imminent or life-threatening deterioration.  Critical care was time spent personally by me on the following activities: development of treatment plan with patient and/or surrogate as well as nursing, discussions with consultants, evaluation of patient's response to treatment, examination of patient, obtaining history from patient or surrogate, ordering and performing treatments and interventions, ordering and review of laboratory studies, ordering and review of radiographic studies, pulse oximetry and re-evaluation of patient's condition.  Medications Ordered in ED Medications  lactated ringers infusion (has no administration in time range)  sodium chloride 0.9 % bolus 2,000 mL (0 mLs Intravenous Stopped 11/11/18 0019)     Initial Impression /  Assessment and Plan / ED Course  I have reviewed the triage vital signs and the nursing notes.  Pertinent labs & imaging results that were available during my care of the patient were reviewed by me and considered in my medical decision making (see chart for details).     She with history of hypertension here for evaluation of dizziness. On ED evaluation on presentation he is hypotensive, drowsy with generalized weakness and evidence of poor perfusion on exam. He was treated with aggressive IV fluid hydration and his blood pressure improved to 659 systolic. On repeat assessment his mental status is improved and he denies any current complaints. Please note that patient did not have hypoxia during his ED stay in stats of 81% were erroneously entered. EKG is markedly abnormal, but similar when compared to priors. Troponin is markedly elevated. Hospitalist consulted for admission for further evaluation and treatment of acute renal failure, dehydration. Cardiology consulted regarding elevated troponin.  Patient updated of findings of studies and recommendation for admission  and he is in agreement with treatment plan.  Presentation is not c/w dissection.  Records reviewed in care everywhere patient with history of non-ischemic cardiomyopathy, lost EF of 40 to 45% in August 2018.  Final Clinical Impressions(s) / ED Diagnoses   Final diagnoses:  None    ED Discharge Orders    None       Quintella Reichert, MD 11/11/18 [phone]

## 2018-11-10 NOTE — ED Triage Notes (Signed)
C/o dizziness, SOB since 1/5 when he was seen here-NAD-to triage in w/c

## 2018-11-10 NOTE — ED Notes (Signed)
Daughter September updated [phone]

## 2018-11-10 NOTE — ED Notes (Signed)
CRITICAL VALUE ALERT  Critical Value:troponin 1.57  Date & Time Notied: 11/10/2018 20S13 Provider Notified: Dr. Florina Ou

## 2018-11-11 ENCOUNTER — Encounter (HOSPITAL_COMMUNITY): Payer: Self-pay | Admitting: Internal Medicine

## 2018-11-11 DIAGNOSIS — N200 Calculus of kidney: Secondary | ICD-10-CM

## 2018-11-11 DIAGNOSIS — R42 Dizziness and giddiness: Secondary | ICD-10-CM

## 2018-11-11 DIAGNOSIS — I5022 Chronic systolic (congestive) heart failure: Secondary | ICD-10-CM

## 2018-11-11 DIAGNOSIS — E876 Hypokalemia: Secondary | ICD-10-CM

## 2018-11-11 DIAGNOSIS — I5020 Unspecified systolic (congestive) heart failure: Secondary | ICD-10-CM

## 2018-11-11 DIAGNOSIS — R7989 Other specified abnormal findings of blood chemistry: Secondary | ICD-10-CM

## 2018-11-11 DIAGNOSIS — N183 Chronic kidney disease, stage 3 unspecified: Secondary | ICD-10-CM

## 2018-11-11 DIAGNOSIS — E872 Acidosis, unspecified: Secondary | ICD-10-CM

## 2018-11-11 DIAGNOSIS — R778 Other specified abnormalities of plasma proteins: Secondary | ICD-10-CM

## 2018-11-11 DIAGNOSIS — N179 Acute kidney failure, unspecified: Secondary | ICD-10-CM | POA: Diagnosis not present

## 2018-11-11 DIAGNOSIS — I959 Hypotension, unspecified: Secondary | ICD-10-CM

## 2018-11-11 DIAGNOSIS — E871 Hypo-osmolality and hyponatremia: Secondary | ICD-10-CM

## 2018-11-11 DIAGNOSIS — I5042 Chronic combined systolic (congestive) and diastolic (congestive) heart failure: Secondary | ICD-10-CM

## 2018-11-11 DIAGNOSIS — E785 Hyperlipidemia, unspecified: Secondary | ICD-10-CM

## 2018-11-11 LAB — URINALYSIS, ROUTINE W REFLEX MICROSCOPIC
Bilirubin Urine: NEGATIVE
Glucose, UA: NEGATIVE mg/dL
Ketones, ur: 5 mg/dL — AB
Leukocytes, UA: NEGATIVE
Nitrite: NEGATIVE
Protein, ur: NEGATIVE mg/dL
Specific Gravity, Urine: 1.016 (ref 1.005–1.030)
pH: 5 (ref 5.0–8.0)

## 2018-11-11 LAB — MAGNESIUM: Magnesium: 2.2 mg/dL (ref 1.7–2.4)

## 2018-11-11 LAB — TROPONIN I
Troponin I: 0.78 ng/mL (ref <0.03)
Troponin I: 0.6 ng/mL (ref <0.03)

## 2018-11-11 LAB — CK: CK TOTAL: 135 U/L (ref 49–397)

## 2018-11-11 LAB — I-STAT CG4 LACTIC ACID, ED: Lactic Acid, Venous: 2.44 mmol/L (ref 0.5–1.9)

## 2018-11-11 LAB — LACTIC ACID, PLASMA: Lactic Acid, Venous: 1 mmol/L (ref 0.5–1.9)

## 2018-11-11 MED ORDER — ONDANSETRON HCL 4 MG/2ML IJ SOLN
4.0000 mg | Freq: Once | INTRAMUSCULAR | Status: AC
  Administered 2018-11-11: 4 mg via INTRAVENOUS

## 2018-11-11 MED ORDER — CARVEDILOL 12.5 MG PO TABS
12.5000 mg | ORAL_TABLET | Freq: Two times a day (BID) | ORAL | Status: DC
  Administered 2018-11-11 – 2018-11-13 (×5): 12.5 mg via ORAL
  Filled 2018-11-11 (×5): qty 1

## 2018-11-11 MED ORDER — ONDANSETRON HCL 4 MG/2ML IJ SOLN: 4.0000 mg | Freq: Four times a day (QID) | INTRAMUSCULAR | Status: DC | PRN

## 2018-11-11 MED ORDER — ONDANSETRON HCL 4 MG PO TABS: 4.0000 mg | ORAL_TABLET | Freq: Four times a day (QID) | ORAL | Status: DC | PRN

## 2018-11-11 MED ORDER — SODIUM CHLORIDE 0.9 % IV SOLN
INTRAVENOUS | Status: DC
  Administered 2018-11-11 (×2): via INTRAVENOUS

## 2018-11-11 MED ORDER — BISACODYL 10 MG RE SUPP: 10.0000 mg | Freq: Every day | RECTAL | Status: DC | PRN

## 2018-11-11 MED ORDER — POTASSIUM CHLORIDE CRYS ER 20 MEQ PO TBCR
40.0000 meq | EXTENDED_RELEASE_TABLET | Freq: Once | ORAL | Status: AC
  Administered 2018-11-11: 40 meq via ORAL
  Filled 2018-11-11: qty 2

## 2018-11-11 MED ORDER — VITAMIN D (ERGOCALCIFEROL) 1.25 MG (50000 UNIT) PO CAPS
50000.0000 [IU] | ORAL_CAPSULE | ORAL | Status: DC
  Administered 2018-11-15: 50000 [IU] via ORAL
  Filled 2018-11-11: qty 1

## 2018-11-11 MED ORDER — HEPARIN SODIUM (PORCINE) 5000 UNIT/ML IJ SOLN
5000.0000 [IU] | Freq: Three times a day (TID) | INTRAMUSCULAR | Status: DC
  Administered 2018-11-11 – 2018-11-15 (×10): 5000 [IU] via SUBCUTANEOUS
  Filled 2018-11-11 (×10): qty 1

## 2018-11-11 MED ORDER — LACTATED RINGERS IV SOLN
INTRAVENOUS | Status: DC
  Administered 2018-11-11 (×2): via INTRAVENOUS

## 2018-11-11 MED ORDER — DOCUSATE SODIUM 100 MG PO CAPS: 100.0000 mg | ORAL_CAPSULE | Freq: Two times a day (BID) | ORAL | Status: DC | PRN

## 2018-11-11 MED ORDER — ONDANSETRON HCL 4 MG/2ML IJ SOLN
INTRAMUSCULAR | Status: AC
  Filled 2018-11-11: qty 2

## 2018-11-11 MED ORDER — ROSUVASTATIN CALCIUM 5 MG PO TABS
5.0000 mg | ORAL_TABLET | Freq: Every day | ORAL | Status: DC
  Administered 2018-11-11 – 2018-11-12 (×2): 5 mg via ORAL
  Filled 2018-11-11 (×2): qty 1

## 2018-11-11 MED ORDER — AMLODIPINE BESYLATE 10 MG PO TABS
10.0000 mg | ORAL_TABLET | Freq: Every day | ORAL | Status: DC
  Administered 2018-11-11 – 2018-11-13 (×3): 10 mg via ORAL
  Filled 2018-11-11 (×3): qty 1

## 2018-11-11 NOTE — Progress Notes (Addendum)
CRITICAL VALUE ALERT  Critical Value:  TROPONIN=0.60   Date & Time Notied:  11/11/2018 AT 1359  Provider Notified: YES--DR [facility]  Orders Received/Actions taken: NONE

## 2018-11-11 NOTE — H&P (Signed)
History and Physical    Darius Rivers FGH:[id number] DOB: 1960-12-15 DOA: 11/10/2018  PCP: System, Pcp Not In Consultants:  Cards Patient coming from: Home- lives with friend Chief Complaint: Dizziness  HPI: Darius Rivers is a 58 y.o. male with medical history significant for HTN, systolic CHF (EF 67-89%), HLD on statin, prostate CA (dx end of 2018), CKD II, ascending aortic aneurysm, known nephrolithiasis who presented to the ED today with c/o dizziness x 2 days. He was seen in our ED on Jan 3 of this year for N/V/D and given dx of gastroenteritis, given IVF and sent home with a Rx for phenergan. At that time his creatinine was at baseline at 1.01. Pt states that he had had significant vomiting leading up to that time but this has since almost completely resolved. He states he never had significant diarrhea and reports normal BMs over the last few days. He says he has been eating and drinking per usual. However, he has had worsening dizziness along with mild dyspnea that started about 2 days ago and was worsening. When he became SOB last night he realized he needed to go to the ED. He reports no chest pain, no palpitations, no orthopnea, PND. No cough, URI sxs, no fever. His urine has been more concentrated in appearance. He is compliant with his HTN meds which include lisinopril and lasix (along with carvedilol and amlodipine). He has had no hematuria or flank pain. No melena/hematochezia. No focal weakness.  ED Course:  In the ED at [facility] he was confused, profoundly weak and hypotensive with BP in the 90-100/70 range. He was mildly tachypneic. Afebrile, HR WNL. SaO2 were normal. BUN/Cr was 61/4.78. TnI was elevated at 1.57. He was hyponatremic (131) and hypokalemic (2.9) as well. K was replaced. EKG was abnormal showing LVH and IVCD with secondary repol abnormalities but not significantly changed from prior. CTAP showed nonobstructive bilateral nephrolithiasis and mild bladder wall thickening  thought likely due to underdistension (vs cystitis). He was he was given a 2L NS bolus and BP responded briskly, and was actually hypertensive following the IVF in the 140-170/80-100 range. He was accepted at [facility] for admission.   Review of Systems: As per HPI; otherwise review of systems reviewed and negative.   Ambulatory Status:  Ambulates without assistance  Past Medical History:  Diagnosis Date  . Ascending aortic aneurysm ([place])   . CKD (chronic kidney disease), stage II   . Hyperlipidemia   . Hypertension   . Nephrolithiasis   . Prostate cancer ([place])   . Systolic CHF [facility])     Past Surgical History:  Procedure Laterality Date  . HERNIA REPAIR Bilateral   . LITHOTRIPSY      Social History   Socioeconomic History  . Marital status: Single    Spouse name: Not on file  . Number of children: Not on file  . Years of education: Not on file  . Highest education level: Not on file  Occupational History  . Not on file  Social Needs  . Financial resource strain: Not on file  . Food insecurity:    Worry: Not on file    Inability: Not on file  . Transportation needs:    Medical: Not on file    Non-medical: Not on file  Tobacco Use  . Smoking status: Former Smoker    Last attempt to quit: 11/11/1998    Years since quitting: 20.0  . Smokeless tobacco: Never Used  Substance and Sexual Activity  . Alcohol  use: Yes    Comment: occ  . Drug use: Never  . Sexual activity: Not on file  Lifestyle  . Physical activity:    Days per week: Not on file    Minutes per session: Not on file  . Stress: Not on file  Relationships  . Social connections:    Talks on phone: Not on file    Gets together: Not on file    Attends religious service: Not on file    Active member of club or organization: Not on file    Attends meetings of clubs or organizations: Not on file    Relationship status: Not on file  . Intimate partner violence:    Fear of current or ex partner: Not on file     Emotionally abused: Not on file    Physically abused: Not on file    Forced sexual activity: Not on file  Other Topics Concern  . Not on file  Social History Narrative  . Not on file    No Known Allergies  Family History  Problem Relation Age of Onset  . High blood pressure Mother   . Heart attack Neg Hx   . Cancer Neg Hx   . Diabetes Neg Hx     Prior to Admission medications   Medication Sig Start Date End Date Taking? Authorizing Provider  amLODipine (NORVASC) 10 MG tablet TAKE ONE TABLET BY MOUTH DAILY 11/04/18  Yes [provider]  carvedilol (COREG) 12.5 MG tablet TAKE 1 TABLET BY MOUTH 2 TIMES DAILY WITH MEALS 04/13/17  Yes [provider]  furosemide (LASIX) 40 MG tablet Take by mouth. 09/15/16  Yes [provider]  lisinopril (PRINIVIL,ZESTRIL) 40 MG tablet TAKE ONE TABLET BY MOUTH DAILY 09/27/18  Yes [provider]  rosuvastatin (CRESTOR) 5 MG tablet Take by mouth. 04/13/17 04/02/19 Yes [provider]  Vitamin D, Ergocalciferol, (DRISDOL) 1.25 MG (50000 UT) CAPS capsule TAKE ONE CAPSULE BY MOUTH ONCE WEEKLY 10/05/17  Yes [provider]  promethazine (PHENERGAN) 25 MG tablet Take 1 tablet (25 mg total) by mouth every 6 (six) hours as needed for nausea or vomiting. 11/05/18   Dalia Heading, PA-C    Physical Exam: Vitals:   11/11/18 0830 11/11/18 0930 11/11/18 1000 11/11/18 1030  BP: (!) 166/94 (!) 170/98 (!) 155/91 (!) 150/98  Pulse: 78 82 94 81  Resp: (!) 23 20 (!) 24 18  Temp:   98.7 F (37.1 C)   TempSrc:   Oral   SpO2: 100% 99% 100% 99%     . General: Appears calm and comfortable and is in NAD. Occasionally appears confused by questions but overall alert and oriented x 4. . Eyes:  PERRL, EOMI, normal lids, iris . ENT:  grossly normal hearing, lips & tongue, mmm . Neck:  supple, no lymphadenopathy . Cardiovascular:  nL S1, S2, normal rate, reg rhythm, no murmur. Marland Kitchen Respiratory:   CTA bilaterally with no  wheezes/rales/rhonchi.  Normal respiratory effort. . Abdomen:  soft, NT, ND, NABS . Back:   grossly normal alignment . Skin:  no rash or lesions seen on limited exam . Musculoskeletal:  grossly normal tone BUE/BLE, good ROM, no bony abnormality or obvious joint deformity . Lower extremities:  No LE edema.  Limited foot exam with no ulcerations.  2+ distal pulses. Marland Kitchen Psychiatric:  grossly normal mood and affect, speech fluent and appropriate, AOx3 . Neurologic:  CN 2-12 grossly intact, moves all extremities in coordinated fashion, sensation intact, Patellar  DTRs 2+ and symmetric. No weakness, either focal or generalized.    Radiological Exams on Admission: Ct Abdomen Pelvis Wo Contrast  Result Date: 11/11/2018 CLINICAL DATA:  Dizziness and dyspnea x4 days. Acute renal failure. Hypertension. EXAM: CT ABDOMEN AND PELVIS WITHOUT CONTRAST TECHNIQUE: Multidetector CT imaging of the abdomen and pelvis was performed following the standard protocol without IV contrast. COMPARISON:  09/09/2016 FINDINGS: LOWER CHEST: Lung bases are clear. Included heart size is normal. No pericardial effusion. HEPATOBILIARY: Stable hypodensities scattered throughout the liver, the largest in the left hepatic lobe measuring 17 mm and on the right measuring 14 mm. These are unchanged and likely to represent small cysts or hemangiomata. No intrahepatic ductal dilatation. Physiologic distention of gallbladder without stones. PANCREAS: No inflammation or ductal dilatation. No mass given limitations of a noncontrast study. SPLEEN: Normal size spleen without mass. ADRENALS/URINARY TRACT: Kidneys are orthotopic. Bilateral nonobstructing renal calculi. No hydroureteronephrosis nor ureteral calculus. Urinary bladder is partially decompressed which may account for the slightly thickened appearance of the bladder wall. Cystitis is not entirely excluded but believed less likely. Clinical correlation is suggested. Normal adrenal glands.  STOMACH/BOWEL: Large amount retained stool is seen throughout much of the colon admixed with hyperdense contrast or bismuth containing medication. No small or large bowel obstruction. The appendix is not confidently identified no pericecal inflammation is seen. The stomach is physiologically distended with small hiatal hernia. Normal small bowel rotation is noted. VASCULAR/LYMPHATIC: Moderate aortoiliac and branch vessel atherosclerosis. No lymphadenopathy. REPRODUCTIVE: Enlarged prostate measuring up to 5.8 cm transverse. OTHER: Small umbilical hernia with herniation of a small segment of nonincarcerated small bowel, series 2/49. MUSCULOSKELETAL: Nonacute. IMPRESSION: 1. Bilateral nonobstructing renal calculi. No obstructive uropathy. 2. Slightly thickened appearance of the bladder wall which may be due to underdistention. Cystitis is not entirely excluded but believed less likely. 3. Stable hypodensities scattered throughout the liver likely to represent cysts or hemangiomata. 4. Small umbilical hernia containing a small segment of nonincarcerated small bowel. No incarceration or obstruction. Electronically Signed   By: Ashley Royalty M.D.   On: 11/11/2018 00:53   Dg Chest Port 1 View  Result Date: 11/10/2018 CLINICAL DATA:  Dizziness and dyspnea since 11/07/2017 EXAM: PORTABLE CHEST 1 VIEW COMPARISON:  CT 09/09/2016, CXR 09/08/2016 FINDINGS: The cardiopericardial silhouette is within normal limits. Mild prominence of the ascending portion of the thoracic aorta common consistent with known ascending thoracic aortic aneurysm. Both lungs are clear. The visualized skeletal structures are unremarkable. IMPRESSION: No active disease. Stable mild prominence of the mediastinum secondary to known ascending thoracic aortic aneurysm without change. Electronically Signed   By: Ashley Royalty M.D.   On: 11/10/2018 23:14    EKG: Independently reviewed.   Date/Time:                  Wednesday November 10 2018 22:31:34  EST Ventricular Rate:         67 PR Interval:                   QRS Duration: 115 QT Interval:                 439 QTC Calculation:        464 R Axis:                         -13 Text Interpretation:       Sinus rhythm Consider left atrial enlargement LVH with IVCD and secondary repol abnrm  Baseline wander in lead(s) I II III aVL aVF V1 V2 V4 V5 V6  Last EF 40-45% in Aug 2018  Labs on Admission: I have personally reviewed the available labs and imaging studies at the time of the admission.  Pertinent labs:  COMPREHENSIVE METABOLIC PANEL - Abnormal; Notable for the following components:      Result Value    Sodium 131 (*)    Potassium 2.9 (*)    Chloride 96 (*)    Glucose, Bld 132 (*)    BUN 61 (*)    Creatinine, Ser 4.78 (*)    GFR calc non Af Amer 13 (*)    GFR calc Af Amer 15 (*)    All other components within normal limits  CBC WITH DIFFERENTIAL/PLATELET - Abnormal; Notable for the following components:   WBC 15.0 (*)    RBC 4.15 (*)    Neutro Abs 10.8 (*)    All other components within normal limits  TROPONIN I - Abnormal; Notable for the following components:   Troponin I 1.57 (*)    All other components within normal limits  CULTURE, BLOOD (ROUTINE X 2)  CULTURE, BLOOD (ROUTINE X 2)  MAGNESIUM 2.2  I-STAT CG4 LACTIC ACID, ED  I-STAT CG4 LACTIC ACID, ED   Last creat 11/05/18: 1.01 TnI trend 1.57 --> 0.78 Lactic acid 1.57 --> 2.44  Assessment/Plan Principal Problem:   AKI (acute kidney injury) ([place]) Active Problems:   Troponin I above reference range   Hypotension   Dizziness   Hyperlipidemia   Chronic systolic CHF (congestive heart failure) ([facility])   CKD (chronic kidney disease), stage III ([facility])   Hyponatremia   Hypokalemia   Lactic acid increased   Bilateral nephrolithiasis   AKI: Likely due to volume depletion from recent gastroenteritis and subsequent GI losses as well as hypotension. He has CKD II, last creat 1.01 just 6 days  ago when he presented with GI symptoms. BUN also elevated at 61 which supports prerenal etiology. -f/u U/A which was ordered today -cont NS at 125 cc/hour -trend BMP -hold lasix, lisinopril -avoid nephrotoxins -renally dose all meds -hyponatremia, hypokalemia 2/2 volume loss: monitor and replace  Hypotension: resolved. Likely 2/2 volume losses from recent gastroenteritis. Now BP normal - high. No other evidence of sepsis/infection. -hold lasix, ACE -cont coreg, amlodipine  Lactic acidosis: felt 2/2 hypotension, hypoperfusion. No other evidence of sepsis/infection. -trend to peak  Elevated troponin: no h/o ischemic heart disease. Has RFs of HTN, HLD. No FH CAD. No ischemic changes on EKG. Likely demand ischemia from hypotension -trend troponin to peak -cards to eval  Dizziness: likely orthostatic 2/2 hypotension/hypovolemia. No focal neuro findings on exam or worrisome elements in history to suggest central cause. -monitor (improving)  Chronic systolic CHF, compensated, not in exacerbation. Last EF 40-45% -cont coreg, statin -hold lasix, lisinopril given AKI -cont IVF, careful to avoid vol overload  HLD: cont crestor  Bilateral nonobstructive nephrolithiasis; h/o lithotripsy in the past. No flank pain, no fever. -f/u U/A -follow as outpatient  Ascending aortic aneurysm: last chest CT 09/2016 showing maximum measurement of 4.4 cm. Overdue for repeat imaging. Will need ASAP after discharge.    DVT prophylaxis: heparin [place] Code Status:  Full - confirmed with patient/family Family Communication: none  Disposition Plan:  Home once clinically improved Consults called: cards  Admission status: It is my clinical opinion that referral for OBSERVATION is reasonable and necessary in this patient based on the above information provided. The aforementioned taken together are  felt to place the patient at high risk for further clinical deterioration. However it is anticipated that the  patient may be medically stable for discharge from the hospital within 24 to 48 hours.     Janora Norlander MD [facility]  If note is complete, please contact covering daytime or nighttime physician. [email or web address] Password [facility]  11/11/2018, 12:26 PM

## 2018-11-11 NOTE — ED Notes (Signed)
Message sent to admitting MD regarding holding in ED, requesting diet and trending troponin orders. Awaiting response. Will continue to monitor patient.

## 2018-11-12 ENCOUNTER — Inpatient Hospital Stay (HOSPITAL_COMMUNITY): Payer: BLUE CROSS/BLUE SHIELD

## 2018-11-12 DIAGNOSIS — I471 Supraventricular tachycardia: Secondary | ICD-10-CM | POA: Diagnosis present

## 2018-11-12 DIAGNOSIS — I249 Acute ischemic heart disease, unspecified: Secondary | ICD-10-CM | POA: Diagnosis not present

## 2018-11-12 DIAGNOSIS — Z87891 Personal history of nicotine dependence: Secondary | ICD-10-CM | POA: Diagnosis not present

## 2018-11-12 DIAGNOSIS — I472 Ventricular tachycardia: Secondary | ICD-10-CM | POA: Diagnosis not present

## 2018-11-12 DIAGNOSIS — Z8546 Personal history of malignant neoplasm of prostate: Secondary | ICD-10-CM | POA: Diagnosis not present

## 2018-11-12 DIAGNOSIS — N183 Chronic kidney disease, stage 3 (moderate): Secondary | ICD-10-CM

## 2018-11-12 DIAGNOSIS — E1122 Type 2 diabetes mellitus with diabetic chronic kidney disease: Secondary | ICD-10-CM | POA: Diagnosis present

## 2018-11-12 DIAGNOSIS — E871 Hypo-osmolality and hyponatremia: Secondary | ICD-10-CM | POA: Diagnosis present

## 2018-11-12 DIAGNOSIS — I2510 Atherosclerotic heart disease of native coronary artery without angina pectoris: Secondary | ICD-10-CM | POA: Diagnosis present

## 2018-11-12 DIAGNOSIS — I351 Nonrheumatic aortic (valve) insufficiency: Secondary | ICD-10-CM | POA: Diagnosis not present

## 2018-11-12 DIAGNOSIS — I714 Abdominal aortic aneurysm, without rupture: Secondary | ICD-10-CM | POA: Diagnosis present

## 2018-11-12 DIAGNOSIS — R571 Hypovolemic shock: Secondary | ICD-10-CM | POA: Diagnosis present

## 2018-11-12 DIAGNOSIS — I5022 Chronic systolic (congestive) heart failure: Secondary | ICD-10-CM

## 2018-11-12 DIAGNOSIS — E785 Hyperlipidemia, unspecified: Secondary | ICD-10-CM | POA: Diagnosis present

## 2018-11-12 DIAGNOSIS — I130 Hypertensive heart and chronic kidney disease with heart failure and stage 1 through stage 4 chronic kidney disease, or unspecified chronic kidney disease: Secondary | ICD-10-CM | POA: Diagnosis present

## 2018-11-12 DIAGNOSIS — Z87442 Personal history of urinary calculi: Secondary | ICD-10-CM | POA: Diagnosis not present

## 2018-11-12 DIAGNOSIS — E872 Acidosis: Secondary | ICD-10-CM | POA: Diagnosis present

## 2018-11-12 DIAGNOSIS — R42 Dizziness and giddiness: Secondary | ICD-10-CM | POA: Diagnosis present

## 2018-11-12 DIAGNOSIS — N200 Calculus of kidney: Secondary | ICD-10-CM | POA: Diagnosis present

## 2018-11-12 DIAGNOSIS — I428 Other cardiomyopathies: Secondary | ICD-10-CM | POA: Diagnosis present

## 2018-11-12 DIAGNOSIS — I951 Orthostatic hypotension: Secondary | ICD-10-CM | POA: Diagnosis present

## 2018-11-12 DIAGNOSIS — N179 Acute kidney failure, unspecified: Secondary | ICD-10-CM | POA: Diagnosis present

## 2018-11-12 DIAGNOSIS — I509 Heart failure, unspecified: Secondary | ICD-10-CM | POA: Diagnosis not present

## 2018-11-12 DIAGNOSIS — I712 Thoracic aortic aneurysm, without rupture: Secondary | ICD-10-CM | POA: Diagnosis present

## 2018-11-12 DIAGNOSIS — I10 Essential (primary) hypertension: Secondary | ICD-10-CM | POA: Diagnosis not present

## 2018-11-12 DIAGNOSIS — R7989 Other specified abnormal findings of blood chemistry: Secondary | ICD-10-CM

## 2018-11-12 DIAGNOSIS — E876 Hypokalemia: Secondary | ICD-10-CM | POA: Diagnosis present

## 2018-11-12 LAB — BASIC METABOLIC PANEL
Anion gap: 7 (ref 5–15)
BUN: 38 mg/dL — ABNORMAL HIGH (ref 6–20)
CO2: 24 mmol/L (ref 22–32)
Calcium: 8.7 mg/dL — ABNORMAL LOW (ref 8.9–10.3)
Chloride: 105 mmol/L (ref 98–111)
Creatinine, Ser: 2.14 mg/dL — ABNORMAL HIGH (ref 0.61–1.24)
GFR calc Af Amer: 39 mL/min — ABNORMAL LOW (ref >60)
GFR calc non Af Amer: 34 mL/min — ABNORMAL LOW (ref >60)
Glucose, Bld: 89 mg/dL (ref 70–99)
Potassium: 3.3 mmol/L — ABNORMAL LOW (ref 3.5–5.1)
Sodium: 136 mmol/L (ref 135–145)

## 2018-11-12 LAB — ECHOCARDIOGRAM COMPLETE
Height: 76 in
Weight: 2921.6 oz

## 2018-11-12 LAB — CBC
HCT: 36.1 % — ABNORMAL LOW (ref 39.0–52.0)
Hemoglobin: 12 g/dL — ABNORMAL LOW (ref 13.0–17.0)
MCH: 32.8 pg (ref 26.0–34.0)
MCHC: 33.2 g/dL (ref 30.0–36.0)
MCV: 98.6 fL (ref 80.0–100.0)
Platelets: 332 10*3/uL (ref 150–400)
RBC: 3.66 MIL/uL — ABNORMAL LOW (ref 4.22–5.81)
RDW: 12.4 % (ref 11.5–15.5)
WBC: 9.6 10*3/uL (ref 4.0–10.5)
nRBC: 0 % (ref 0.0–0.2)

## 2018-11-12 LAB — TROPONIN I
TROPONIN I: 0.22 ng/mL — AB (ref <0.03)
Troponin I: 0.35 ng/mL (ref <0.03)
Troponin I: 0.23 ng/mL (ref <0.03)

## 2018-11-12 LAB — HIV ANTIBODY (ROUTINE TESTING W REFLEX): HIV Screen 4th Generation wRfx: NONREACTIVE

## 2018-11-12 MED ORDER — SODIUM CHLORIDE 0.9 % IV SOLN
INTRAVENOUS | Status: DC
  Administered 2018-11-12 – 2018-11-15 (×6): via INTRAVENOUS

## 2018-11-12 MED ORDER — POTASSIUM CHLORIDE CRYS ER 20 MEQ PO TBCR
40.0000 meq | EXTENDED_RELEASE_TABLET | Freq: Once | ORAL | Status: AC
  Administered 2018-11-12: 40 meq via ORAL
  Filled 2018-11-12: qty 2

## 2018-11-12 NOTE — Progress Notes (Signed)
Patient had 9 beats of ventricular tachycardia, asymptomatic.

## 2018-11-12 NOTE — Consult Note (Addendum)
Cardiology Consultation:   Patient ID: Darius Rivers MRN: [id number]; DOB: 18-Dec-1960  Admit date: 11/10/2018 Date of Consult: 11/12/2018  Primary Care Provider: System, Pcp Not In Primary Cardiologist: New to [facility] (Previously [facility]iology in [facility]) Primary Electrophysiologist:  Previously [facility] Cardiology   Patient Profile:   Darius Rivers is a 58 y.o. AA male with a hx of NICM, chronic systolic HF, nonobstructive CAD, VT, ascending aortic aneurysm , HTN, LVH, HLD, CKD, h/o prostate cancer and nephrolithiasis, who is being seen today for the evaluation of elevated troponin, at the request of Dr. Tamala Julian, Internal Medicine.   History of Present Illness:   Darius Rivers has been followed in the past by [facility] cardiology in [place]. Records are in [place]. It appears his last OV was in 2016. Pt reports he was w/o insurance for a period of time and did not f/u.  Per records, he has a h/o NICM and chronic systolic HF. EF has been as low as 20%. Per prior notes, he had a LHC which showed nonobstructive CAD (unable to locate actual cath report w/ angiographic details). He also had NSVT and was treated w/ amiodarone. He was referred to EP given persisetently low EF despite medical therapy and was considered for ICD. Per consult note, the patient declined ICD at that time given lack of insurance. However, he had a repeat echo in 2017 that did show improvement in LVEF to 40-45%. Moderate LVH was noted.  He also has an ascending aortic aneurysm. Last CT scan was in 2016 and showed Fusiform aneurysmal dilatation of the ascending aorta with maximum measurements of 4.4 x 4.2 cm. No repeat studies since that time. Pt notes he now has medical insurance and his PCP has been managing his HTN and DM.   Pt was recently seen in the ED on 11/05/18 for N/V/D and given dx of gastroenteritis, given IVF and sent home with a Rx for phenergan. At that time his creatinine was at baseline at 1.01. Despite this, he continued to  have vomiting and diarrhea and he developed subequent dizziness and dyspnea. Symptoms became worse, prompting him to present back to the ED. No chest pain, no palpitations, no orthopnea, PND. No cough, URI sxs, no fever.  Pt went to [facility]. There, he was found to have an AKI w/ SCr now up to 4.78. BUN 61. Also hypokalemic at 2.9. He was confused, profoundly week and borderline hypotensive. SBPs in the 90s. HR and O2 sats normal. EKG showed SR w/ LVH. CT of the abdomen w/o contrast showed nonobstructive bilateral nephrolithiasis and mild bladder wall thickening thought likely due to underdistension (vs cystitis). BP in the ED improved w/ IVFs. Pt was transferred to [facility] for further care. He was admitted by IM. Renal function is improving. SCr at 2.14 today.   Cardiology has been consulted for elevated troponin. Initial troponin on 1/8 was 1.57. Level is trending downward, 1.57>>0.78>>0.60>>0.35. Pt denies any history of CP. Dyspnea resolved. N/V/D resolved. Tolerating POs. He did have a 9 beat run of NSVT on tele earlier today but completely asymptomatic.   Past Medical History:  Diagnosis Date  . Ascending aortic aneurysm ([place])   . CKD (chronic kidney disease), stage II   . Hyperlipidemia   . Hypertension   . Nephrolithiasis   . Prostate cancer ([place])   . Systolic CHF [facility])     Past Surgical History:  Procedure Laterality Date  . HERNIA REPAIR Bilateral   . LITHOTRIPSY  Home Medications:  Prior to Admission medications   Medication Sig Start Date End Date Taking? Authorizing Provider  amLODipine (NORVASC) 10 MG tablet Take 10 mg by mouth daily.  11/04/18  Yes [provider]  aspirin EC 81 MG tablet Take 81 mg by mouth daily.   Yes [provider]  carvedilol (COREG) 12.5 MG tablet Take 12.5 mg by mouth 2 (two) times daily with a meal.  04/13/17  Yes [provider]  furosemide (LASIX) 40 MG tablet Take 40 mg by mouth daily.  09/15/16  Yes [provider]  lisinopril (PRINIVIL,ZESTRIL) 40 MG tablet Take 40 mg by mouth daily.  09/27/18  Yes [provider]  promethazine (PHENERGAN) 25 MG tablet Take 1 tablet (25 mg total) by mouth every 6 (six) hours as needed for nausea or vomiting. 11/05/18  Yes Lawyer, Harrell Gave, PA-C  rosuvastatin (CRESTOR) 5 MG tablet Take 5 mg by mouth daily at 6 PM.  04/13/17 04/02/19 Yes [provider]  Vitamin D, Ergocalciferol, (DRISDOL) 1.25 MG (50000 UT) CAPS capsule Take 50,000 Units by mouth every 7 (seven) days.  10/05/17  Yes [provider]    Inpatient Medications: Scheduled Meds: . amLODipine  10 mg Oral Daily  . carvedilol  12.5 mg Oral BID WC  . heparin  5,000 Units Subcutaneous Q8H  . rosuvastatin  5 mg Oral q1800  . [START ON 11/15/2018] Vitamin D (Ergocalciferol)  50,000 Units Oral Weekly   Continuous Infusions: . sodium chloride 75 mL/hr at 11/12/18 1035  . lactated ringers 75 mL/hr at 11/11/18 1228   PRN Meds: bisacodyl, docusate sodium, ondansetron **OR** ondansetron (ZOFRAN) IV  Allergies:   No Known Allergies  Social History:   Social History   Socioeconomic History  . Marital status: Single    Spouse name: Not on file  . Number of children: Not on file  . Years of education: Not on file  . Highest education level: Not on file  Occupational History  . 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 Financial resource strain: Not on file  . Food insecurity:    Worry: Not on file    Inability: Not on file  . Transportation needs:    Medical: Not on file    Non-medical: Not on file  Tobacco Use  . Smoking status: Former Smoker    Last attempt to quit: 11/11/1998    Years since quitting: 20.0  . Smokeless tobacco: Never Used  Substance and Sexual Activity  . Alcohol use: Yes    Comment: occ  . Drug use: Never  . Sexual activity: Not on file  Lifestyle  . Physical  activity:    Days per week: Not on file    Minutes per session: Not on file  . Stress: Not on file  Relationships  . Social connections:    Talks on phone: Not on file    Gets together: Not on file    Attends religious service: Not on file    Active member of club or organization: Not on file    Attends meetings of clubs or organizations: Not on file    Relationship status: Not on file  . Intimate partner violence:    Fear of current or ex partner: Not on file    Emotionally abused: Not on file    Physically abused: Not on file    Forced sexual  activity: Not on file  Other Topics Concern  . Not on file  Social History Narrative  . Not on file    Family History:    Family History  Problem Relation Age of Onset  . High blood pressure Mother   . Heart attack Neg Hx   . Cancer Neg Hx   . Diabetes Neg Hx      ROS:  Please see the history of present illness.   All other ROS reviewed and negative.     Physical Exam/Data:   Vitals:   11/11/18 2124 11/12/18 0530 11/12/18 0534 11/12/18 0801  BP: 122/81 (!) 130/94  (!) 132/92  Pulse: 66 71  71  Resp: 20 14    Temp: 98.1 F (36.7 C) 98.5 F (36.9 C)  98.4 F (36.9 C)  TempSrc: Oral Oral  Oral  SpO2: 99% 100%    Weight:   82.8 kg   Height:        Intake/Output Summary (Last 24 hours) at 11/12/2018 1230 Last data filed at 11/12/2018 1035 Gross per 24 hour  Intake 2798.22 ml  Output 2200 ml  Net 598.22 ml   Last 3 Weights 11/12/2018 11/05/2018 09/07/2016  Weight (lbs) 182 lb 9.6 oz 184 lb 180 lb  Weight (kg) 82.827 kg 83.462 kg 81.647 kg     Body mass index is 22.23 kg/m.  General:  Well nourished, well developed, in no acute distress HEENT: normal Lymph: no adenopathy Neck: no JVD Endocrine:  No thryomegaly Vascular: No carotid bruits; FA pulses 2+ bilaterally without bruits  Cardiac:  normal S1, S2; RRR; no murmur  Lungs:  clear to auscultation bilaterally, no wheezing, rhonchi or rales  Abd: soft, nontender,  no hepatomegaly  Ext: no edema Musculoskeletal:  No deformities, BUE and BLE strength normal and equal Skin: warm and dry  Neuro:  CNs 2-12 intact, no focal abnormalities noted Psych:  Normal affect   EKG:  The EKG was personally reviewed and demonstrates:  SR / LVH  Telemetry:  Telemetry was personally reviewed and demonstrates:  NSR, 9 beat run of NSVT on tele today.   Relevant CV Studies: 2D Echo 09/08/16 (Care Everywhere) Findings Mitral Valve Structurally normal mitral valve with good mobility and no significant regurgitation. Aortic Valve The aortic valve appears to be trileaflet. Mild aortic regurgitation. No aortic stenosis. Tricuspid Valve Structurally normal tricuspid valve with no stenosis. Pulmonic Valve The pulmonic valve was not well visualized No Doppler evidence of pulmonic stenosis or insufficiency. Left Atrium Normal left atrium. Left Ventricle Moderate concentric left ventricular hypertrophy Mild global LV dysfunction Ejection fraction is visually estimated at 40-45% Right Atrium Normal right atrium. Right Ventricle Normal right ventricle structure and function. Pericardial Effusion No evidence of pericardial effusion. Miscellaneous The aorta is within normal limits. Visualized aortic arch appears normal, with no dilatation or dissection. Mild Dilation of the aortic root.  Laboratory Data:  Chemistry Recent Labs  Lab 11/05/18 1402 11/10/18 2247 11/12/18 0414  NA 138 131* 136  K 3.1* 2.9* 3.3*  CL 108 96* 105  CO2 21* 22 24  GLUCOSE 136* 132* 89  BUN 11 61* 38*  CREATININE 1.01 4.78* 2.14*  CALCIUM 9.5 9.2 8.7*  GFRNONAA >60 13* 34*  GFRAA >60 15* 39*  ANIONGAP 9 13 7     Recent Labs  Lab 11/05/18 1402 11/10/18 2247  PROT 7.9 7.6  ALBUMIN 4.1 4.1  AST 17 16  ALT 13 12  ALKPHOS 59 53  BILITOT 0.7 0.6  Hematology Recent Labs  Lab 11/05/18 1402 11/10/18 2247 11/12/18 0414  WBC 9.8 15.0* 9.6  RBC 3.86* 4.15* 3.66*  HGB  12.8* 13.8 12.0*  HCT 38.2* 41.5 36.1*  MCV 99.0 100.0 98.6  MCH 33.2 33.3 32.8  MCHC 33.5 33.3 33.2  RDW 12.8 12.8 12.4  PLT 294 388 332   Cardiac Enzymes Recent Labs  Lab 11/10/18 2247 11/11/18 0726 11/11/18 1256 11/12/18 0842  TROPONINI 1.57* 0.78* 0.60* 0.35*   No results for input(s): TROPIPOC in the last 168 hours.  BNPNo results for input(s): BNP, PROBNP in the last 168 hours.  DDimer No results for input(s): DDIMER in the last 168 hours.  Radiology/Studies:  Ct Abdomen Pelvis Wo Contrast  Result Date: 11/11/2018 CLINICAL DATA:  Dizziness and dyspnea x4 days. Acute renal failure. Hypertension. EXAM: CT ABDOMEN AND PELVIS WITHOUT CONTRAST TECHNIQUE: Multidetector CT imaging of the abdomen and pelvis was performed following the standard protocol without IV contrast. COMPARISON:  09/09/2016 FINDINGS: LOWER CHEST: Lung bases are clear. Included heart size is normal. No pericardial effusion. HEPATOBILIARY: Stable hypodensities scattered throughout the liver, the largest in the left hepatic lobe measuring 17 mm and on the right measuring 14 mm. These are unchanged and likely to represent small cysts or hemangiomata. No intrahepatic ductal dilatation. Physiologic distention of gallbladder without stones. PANCREAS: No inflammation or ductal dilatation. No mass given limitations of a noncontrast study. SPLEEN: Normal size spleen without mass. ADRENALS/URINARY TRACT: Kidneys are orthotopic. Bilateral nonobstructing renal calculi. No hydroureteronephrosis nor ureteral calculus. Urinary bladder is partially decompressed which may account for the slightly thickened appearance of the bladder wall. Cystitis is not entirely excluded but believed less likely. Clinical correlation is suggested. Normal adrenal glands. STOMACH/BOWEL: Large amount retained stool is seen throughout much of the colon admixed with hyperdense contrast or bismuth containing medication. No small or large bowel obstruction. The  appendix is not confidently identified no pericecal inflammation is seen. The stomach is physiologically distended with small hiatal hernia. Normal small bowel rotation is noted. VASCULAR/LYMPHATIC: Moderate aortoiliac and branch vessel atherosclerosis. No lymphadenopathy. REPRODUCTIVE: Enlarged prostate measuring up to 5.8 cm transverse. OTHER: Small umbilical hernia with herniation of a small segment of nonincarcerated small bowel, series 2/49. MUSCULOSKELETAL: Nonacute. IMPRESSION: 1. Bilateral nonobstructing renal calculi. No obstructive uropathy. 2. Slightly thickened appearance of the bladder wall which may be due to underdistention. Cystitis is not entirely excluded but believed less likely. 3. Stable hypodensities scattered throughout the liver likely to represent cysts or hemangiomata. 4. Small umbilical hernia containing a small segment of nonincarcerated small bowel. No incarceration or obstruction. Electronically Signed   By: Ashley Royalty M.D.   On: 11/11/2018 00:53   Dg Chest Port 1 View  Result Date: 11/10/2018 CLINICAL DATA:  Dizziness and dyspnea since 11/07/2017 EXAM: PORTABLE CHEST 1 VIEW COMPARISON:  CT 09/09/2016, CXR 09/08/2016 FINDINGS: The cardiopericardial silhouette is within normal limits. Mild prominence of the ascending portion of the thoracic aorta common consistent with known ascending thoracic aortic aneurysm. Both lungs are clear. The visualized skeletal structures are unremarkable. IMPRESSION: No active disease. Stable mild prominence of the mediastinum secondary to known ascending thoracic aortic aneurysm without change. Electronically Signed   By: Ashley Royalty M.D.   On: 11/10/2018 23:14    Assessment and Plan:   Darius Rivers is a 58 y.o. AA male with a hx of NICM, chronic systolic HF, nonobstructive CAD, VT, ascending aortic aneurysm , HTN, LVH, HLD, CKD, h/o prostate cancer and nephrolithiasis, who is being  seen today for the evaluation of elevated troponin, at the  request of Dr. Tamala Julian, Internal Medicine.   1. AKI: likely pre renal. Hypoperfusion/ hypovolemia. SCr > 4 on admit (baseline ~1.0). Scr improving w/ IVFs. Down to 2.14 today. Continue to monitor and avoid nephrotoxic agents. His home lisinopril remains on hold.   2. Hypotension: likely secondary to volume depletion from recent gastroenteritis w/ subsequent diarrhea and vomiting. Resolved w/ IVF resuscitation. BP normotensive and several of his antihypertensives have been resumed. BP stable.   3. Dizziness: resolved. Likely 2/2 hypotension from hypovolemia.   4. Elevated Troponin: 1.57>>0.78>>0.60>>0.35. In the setting of non hemorrhagic hypovolemic shock/  AKI w/ SCr peaking above 4. Renal function appears to be improving. EKG shows SR w/ LVH. He denies CP. Suspect likely demand ischemia, however given his cardiovascular history and long time since outpatient cardiac f/u, we will check a repeat echo to reassess LVEF and wall motion. Not a candidate for cath currently given abnormal renal function. But may consider nuclear stress test for ischemic evaluation.   5. Chronic Systolic HF (H/o NICM): per prior cardiology records, EF was as low as 20% in the past. Last echo in 2017 however showed improved LVF at 40-45%. Appears evolemic on exam. Monitor volume status closely given IVFs.   6. NSVT: 9 beat run noted on tele but asymptomatic. Per records from [facility], he had a h/o NSVT in the past in the setting of low EF and was on amiodarone, but I no longer see this on his home med list. His last echo in 2017 showed improved EF, up to 40-45%. We will update echo. Continue on  blocker, coreg. Will give supplemental K to correct his hypokalemia. Keep electrolytes stable. Continue to monitor on tele.   7. CAD: per office notes from [facility], pt had remote that that showed nonobstructive CAD (limited details). He denies CP. Continue statin and  blocker. LDL goal < 70 mg/dL.   8. LVH: moderate LVH noted on echo in  2017. EKG also c/w LVH. Monitor BP and volume status. He is on a  blocker, Coreg.   9. AAA: last chest CT w/ contrast was in 2017 and showed Fusiform aneurysmal dilatation of the ascending aorta with maximum measurements of 4.4 x 4.2 cm. No repeat studies since that time.  He will need repeat study as an outpatient once renal function improves. Keep BP and HR controlled. Continue  blocker and statin.   10. Hypokalemia: 2.9 on admit. 3.3 today. Mg WNL at 2.2. Per MAR, he did not receive any supplemental K today. Will order 40 mEq. F/u BMP in the AM.     For questions or updates, please contact [place] Please consult [email or web address] for contact info under     Signed, Lyda Jester, PA-C  11/12/2018 12:30 PM

## 2018-11-13 ENCOUNTER — Inpatient Hospital Stay (HOSPITAL_COMMUNITY): Payer: BLUE CROSS/BLUE SHIELD

## 2018-11-13 DIAGNOSIS — I249 Acute ischemic heart disease, unspecified: Secondary | ICD-10-CM

## 2018-11-13 DIAGNOSIS — I509 Heart failure, unspecified: Secondary | ICD-10-CM

## 2018-11-13 DIAGNOSIS — I10 Essential (primary) hypertension: Secondary | ICD-10-CM

## 2018-11-13 DIAGNOSIS — E785 Hyperlipidemia, unspecified: Secondary | ICD-10-CM

## 2018-11-13 LAB — BASIC METABOLIC PANEL
Anion gap: 9 (ref 5–15)
BUN: 21 mg/dL — ABNORMAL HIGH (ref 6–20)
CO2: 22 mmol/L (ref 22–32)
Calcium: 8.6 mg/dL — ABNORMAL LOW (ref 8.9–10.3)
Chloride: 106 mmol/L (ref 98–111)
Creatinine, Ser: 1.5 mg/dL — ABNORMAL HIGH (ref 0.61–1.24)
GFR calc Af Amer: 60 mL/min — ABNORMAL LOW (ref >60)
GFR calc non Af Amer: 52 mL/min — ABNORMAL LOW (ref >60)
Glucose, Bld: 86 mg/dL (ref 70–99)
Potassium: 3.5 mmol/L (ref 3.5–5.1)
Sodium: 137 mmol/L (ref 135–145)

## 2018-11-13 LAB — NM MYOCAR MULTI W/SPECT W/WALL MOTION / EF
Estimated workload: 1 METS
Exercise duration (min): 4 min
Exercise duration (sec): 54 s
MPHR: 165 {beats}/min
Peak HR: 89 {beats}/min
Percent HR: 53 %
Rest HR: 62 {beats}/min

## 2018-11-13 LAB — CBC WITH DIFFERENTIAL/PLATELET
Abs Immature Granulocytes: 0.03 10*3/uL (ref 0.00–0.07)
BASOS ABS: 0 10*3/uL (ref 0.0–0.1)
Basophils Relative: 0 %
Eosinophils Absolute: 0.2 10*3/uL (ref 0.0–0.5)
Eosinophils Relative: 3 %
HCT: 32.8 % — ABNORMAL LOW (ref 39.0–52.0)
Hemoglobin: 11.1 g/dL — ABNORMAL LOW (ref 13.0–17.0)
IMMATURE GRANULOCYTES: 0 %
LYMPHS ABS: 2.2 10*3/uL (ref 0.7–4.0)
Lymphocytes Relative: 26 %
MCH: 33 pg (ref 26.0–34.0)
MCHC: 33.8 g/dL (ref 30.0–36.0)
MCV: 97.6 fL (ref 80.0–100.0)
Monocytes Absolute: 0.8 10*3/uL (ref 0.1–1.0)
Monocytes Relative: 10 %
NRBC: 0 % (ref 0.0–0.2)
Neutro Abs: 5.2 10*3/uL (ref 1.7–7.7)
Neutrophils Relative %: 61 %
Platelets: 312 10*3/uL (ref 150–400)
RBC: 3.36 MIL/uL — ABNORMAL LOW (ref 4.22–5.81)
RDW: 12.3 % (ref 11.5–15.5)
WBC: 8.4 10*3/uL (ref 4.0–10.5)

## 2018-11-13 LAB — MAGNESIUM: Magnesium: 1.6 mg/dL — ABNORMAL LOW (ref 1.7–2.4)

## 2018-11-13 MED ORDER — TECHNETIUM TC 99M TETROFOSMIN IV KIT
10.0000 | PACK | Freq: Once | INTRAVENOUS | Status: AC | PRN
  Administered 2018-11-13: 10 via INTRAVENOUS

## 2018-11-13 MED ORDER — ROSUVASTATIN CALCIUM 20 MG PO TABS
40.0000 mg | ORAL_TABLET | Freq: Every day | ORAL | Status: DC
  Administered 2018-11-13 – 2018-11-15 (×3): 40 mg via ORAL
  Filled 2018-11-13 (×3): qty 2

## 2018-11-13 MED ORDER — REGADENOSON 0.4 MG/5ML IV SOLN
INTRAVENOUS | Status: AC
  Administered 2018-11-13: 0.4 mg
  Filled 2018-11-13: qty 5

## 2018-11-13 MED ORDER — TECHNETIUM TC 99M TETROFOSMIN IV KIT
30.0000 | PACK | Freq: Once | INTRAVENOUS | Status: AC | PRN
  Administered 2018-11-13: 30 via INTRAVENOUS

## 2018-11-13 MED ORDER — MAGNESIUM SULFATE 2 GM/50ML IV SOLN
2.0000 g | Freq: Once | INTRAVENOUS | Status: AC
  Administered 2018-11-13: 2 g via INTRAVENOUS
  Filled 2018-11-13: qty 50

## 2018-11-13 MED ORDER — ASPIRIN 81 MG PO CHEW
81.0000 mg | CHEWABLE_TABLET | Freq: Every day | ORAL | Status: DC
  Administered 2018-11-13 – 2018-11-15 (×3): 81 mg via ORAL
  Filled 2018-11-13 (×3): qty 1

## 2018-11-13 MED ORDER — REGADENOSON 0.4 MG/5ML IV SOLN
0.4000 mg | Freq: Once | INTRAVENOUS | Status: DC
  Filled 2018-11-13: qty 5

## 2018-11-13 NOTE — Progress Notes (Signed)
Progress Note    Jp Eastham  QPY:[id number] DOB: 04-10-1961  DOA: 11/10/2018 PCP: System, Pcp Not In    Brief Narrative:   Chief complaint: Dizziness  Medical records reviewed and are as summarized below:  Darius Rivers is an 58 y.o. male with past medical history significant for HTN, systolic CHF last EF noted to be 40 to 45%, HDL, prostate cancer, CKD stage II, AAA, and nephrolithiasis; who presented to the emergency department for 2 days of dizziness after recent illness of gastroenteritis seen previously in the emergency department on 11/05/2018.Marland Kitchen  Assessment/Plan:   Principal Problem:   AKI (acute kidney injury) ([place]) Active Problems:   Troponin I above reference range   Hypotension   Dizziness   Hyperlipidemia   Chronic systolic CHF (congestive heart failure) (HCC)   CKD (chronic kidney disease), stage III (HCC)   Hyponatremia   Hypokalemia   Lactic acid increased   Bilateral nephrolithiasis  Acute renal failure superimposed on chronic kidney disease stage II: Patient's creatinine previously noted to be 1.16 days prior to admission.  On admission creatinine 4.78 with BUN 61 suggests prerenal cause of symptoms.  Patient was bolused 2 L of normal saline IV fluids, and then placed on lactated Ringer's at 125 mL/h.  Repeat creatinine 2.14 with BUN 38. - Decreased IV fluids to 75 mL/h - Recheck BMP in a.m.  NSTEMI: Troponin initially 1.57 and currently down to 0.35.  Question presence of underlying coronary disease.  However due to poor kidney function patient currently not a candidate for cath. - Will need to follow-up with cardiology for stress testing  Chronic systolic CHF: Echocardiogram showed EF of 40 to 45% with global hypokinesis. -Strict intake and output and daily weights  NSVT: Patient noted to have 9 beat run of nonsustained ventricular tachycardia overnight.  - Correct for electrolyte abnormality - Continue to monitor telemetry  Hyponatremia:  Resolved  Hypokalemia: Potassium today 3.3.  Patient was ordered 40 mEq of potassium chloride. - Continue to monitor and replace as  Body mass index is 22.32 kg/m.   Family Communication/Anticipated D/C date and plan/Code Status   DVT prophylaxis: Heparin Code Status: Full Code.  Family Communication: Discussed plan of care with daughter present at bedside Disposition Plan: To be determined   Medical Consultants:    Cardiology   Anti-Infectives:    None  Subjective:   Patient reports that he is feeling better.  Objective:    Vitals:   11/12/18 0801 11/12/18 1500 11/12/18 2015 11/13/18 0419  BP: (!) 132/92 124/90 (!) 121/93 128/87  Pulse: 71  68 75  Resp:   20 16  Temp: 98.4 F (36.9 C) 98.2 F (36.8 C) 97.6 F (36.4 C) 98.3 F (36.8 C)  TempSrc: Oral Oral Oral Oral  SpO2:   100% 100%  Weight:    83.2 kg  Height:        Intake/Output Summary (Last 24 hours) at 11/13/2018 0600 Last data filed at 11/13/2018 0555 Gross per 24 hour  Intake 2140 ml  Output 2260 ml  Net -120 ml   Filed Weights   11/12/18 0534 11/13/18 0419  Weight: 82.8 kg 83.2 kg    Exam: Constitutional: NAD, calm, comfortable Eyes: PERRL, lids and conjunctivae normal ENMT: Mucous membranes are moist. Posterior pharynx clear of any exudate or lesions.Normal dentition.  Neck: normal, supple, no masses, no thyromegaly Respiratory: clear to auscultation bilaterally, no wheezing, no crackles. Normal respiratory effort. No accessory muscle use.  Cardiovascular: Regular rate  and rhythm, no murmurs / rubs / gallops. No extremity edema. 2+ pedal pulses. No carotid bruits.  Abdomen: no tenderness, no masses palpated. No hepatosplenomegaly. Bowel sounds positive.  Musculoskeletal: no clubbing / cyanosis. No joint deformity upper and lower extremities. Good ROM, no contractures. Normal muscle tone.  Skin: no rashes, lesions, ulcers. No induration Neurologic: CN 2-12 grossly intact. Sensation  intact, DTR normal. Strength 5/5 in all 4.  Psychiatric: Normal judgment and insight. Alert and oriented x 3. Normal mood.    Data Reviewed:   I have personally reviewed following labs and imaging studies:  Labs: Labs show the following:   Basic Metabolic Panel: Recent Labs  Lab 11/10/18 2247 11/11/18 0045 11/12/18 0414  NA 131*  --  136  K 2.9*  --  3.3*  CL 96*  --  105  CO2 22  --  24  GLUCOSE 132*  --  89  BUN 61*  --  38*  CREATININE 4.78*  --  2.14*  CALCIUM 9.2  --  8.7*  MG  --  2.2  --    GFR Estimated Creatinine Clearance: 45.9 mL/min (A) (by C-G formula based on SCr of 2.14 mg/dL (H)). Liver Function Tests: Recent Labs  Lab 11/10/18 2247  AST 16  ALT 12  ALKPHOS 53  BILITOT 0.6  PROT 7.6  ALBUMIN 4.1   No results for input(s): LIPASE, AMYLASE in the last 168 hours. No results for input(s): AMMONIA in the last 168 hours. Coagulation profile No results for input(s): INR, PROTIME in the last 168 hours.  CBC: Recent Labs  Lab 11/10/18 2247 11/12/18 0414  WBC 15.0* 9.6  NEUTROABS 10.8*  --   HGB 13.8 12.0*  HCT 41.5 36.1*  MCV 100.0 98.6  PLT 388 332   Cardiac Enzymes: Recent Labs  Lab 11/11/18 0045 11/11/18 0726 11/11/18 1256 11/12/18 0842 11/12/18 1438 11/12/18 2025  CKTOTAL 135  --   --   --   --   --   TROPONINI  --  0.78* 0.60* 0.35* 0.22* 0.23*   BNP (last 3 results) No results for input(s): PROBNP in the last 8760 hours. CBG: No results for input(s): GLUCAP in the last 168 hours. D-Dimer: No results for input(s): DDIMER in the last 72 hours. Hgb A1c: No results for input(s): HGBA1C in the last 72 hours. Lipid Profile: No results for input(s): CHOL, HDL, LDLCALC, TRIG, CHOLHDL, LDLDIRECT in the last 72 hours. Thyroid function studies: No results for input(s): TSH, T4TOTAL, T3FREE, THYROIDAB in the last 72 hours.  Invalid input(s): FREET3 Anemia work up: No results for input(s): VITAMINB12, FOLATE, FERRITIN, TIBC, IRON,  RETICCTPCT in the last 72 hours. Sepsis Labs: Recent Labs  Lab 11/10/18 2247 11/10/18 2253 11/11/18 0103 11/11/18 1253 11/12/18 0414  WBC 15.0*  --   --   --  9.6  LATICACIDVEN  --  1.57 2.44* 1.0  --     Microbiology Recent Results (from the past 240 hour(s))  Culture, blood (routine x 2)     Status: None (Preliminary result)   Collection Time: 11/11/18 12:45 AM  Result Value Ref Range Status   Specimen Description   Final    BLOOD RIGHT ARM Performed at [facility], [address] [place], [place] [address]    Special Requests   Final    BOTTLES DRAWN AEROBIC AND ANAEROBIC Blood Culture results may not be optimal due to an inadequate volume of blood received in culture bottles Performed at  [facility], [address] [place], [place] [address]    Culture   Final    NO GROWTH 1 DAY Performed at [facility], [place] [address] [place], [place] [address]    Report Status PENDING  Incomplete  Culture, blood (routine x 2)     Status: None (Preliminary result)   Collection Time: 11/11/18 12:50 AM  Result Value Ref Range Status   Specimen Description   Final    BLOOD RIGHT ARM Performed at [facility], [address] [place], [place] [address]    Special Requests   Final    BOTTLES DRAWN AEROBIC ONLY Blood Culture results may not be optimal due to an inadequate volume of blood received in culture bottles Performed at [facility], [address] [place], [place] [address]    Culture   Final    NO GROWTH 1 DAY Performed at [facility], [place] [address] [place], [place] [address]    Report Status PENDING  Incomplete    Procedures and diagnostic studies:  No results found.  Medications:   . amLODipine  10 mg Oral Daily  . carvedilol  12.5 mg Oral BID WC  . heparin  5,000 Units Subcutaneous Q8H  . rosuvastatin  5 mg Oral q1800  . [START ON 11/15/2018] Vitamin D (Ergocalciferol)  50,000 Units Oral Weekly    Continuous Infusions: . sodium chloride 75 mL/hr at 11/13/18 0057  . lactated ringers 75 mL/hr at 11/11/18 1228     LOS: 2 days    A   [facility]   *Please refer to [email or web address], password [email or web address] to get updated schedule on who will round on this patient, as hospitalists switch teams weekly. If 7PM-7AM, please contact night-coverage at [email or web address], password [email or web address] for any overnight needs.

## 2018-11-13 NOTE — Progress Notes (Signed)
PROGRESS NOTE  Willet Schleifer GMW:[id number] DOB: Feb 13, 1961 DOA: 11/10/2018 PCP: System, Pcp Not In  HPI/Brief Narrative  Darius Rivers is a 58 y.o. year old male with medical history significant for nonischemic cardiomyopathy (EF 40-45%), HLD, HTN, prostate ca (2018) recent diagnosis of gastroenteritis during ED visit on 1/3 where at that time he received IV fluids and antiemeticswho presented on 11/10/2018 with persistent nausea, abdominal pain, progressive dyspnea aand was found to have profound hypotension and lactic acidosis responsive to IV fluids.  His creatinine was also quite elevated to 4.78 (baseline 1-1 0.1) and had an initial troponin of 1.57.  CT abdomen for abdominal pain evaluation showed no obstructive uropathy with slightly thickened bladder wall but otherwise no acute abnormalities.  His troponin down trended, but cardiology was concerned for acute coronary syndrome rather than demand ischemia.  TTE showed EF of 40-45% (similar to baseline, TTE, 09/2016 at [facility]) with diffuse hypokinesis.  Given poor kidney function patient was not a left heart cath candidate, however creatinine improved quickly to 1.5 at which time patient underwent pharmacological stress test.  Test was significant for large fixed myocardial perfusion defect with no evidence of reversibility, noninvasive or stratification: High.   Assessment/Plan:  Elevated troponin, with ACS rule out.  Troponin trend has gone down, patient without chest pain.  Diffuse hypokinesis on echo, corroborated on pharmacologic stress test with no reversible defect, likely representative of myocardial scar?  Versus hibernating myocardium, will await cardiology recommendations  AKI on CKD Baseline creatinine 1.16, peak creatinine of 4.78, has significantly improved with IV fluids currently 1.5, consistent with prerenal etiology due to volume loss from gastroparesis  Hypotension, resolved Likely in the setting of recently  diagnosed gastroenteritis.  Maintain normal blood pressure with systolics in the 440H.  Will discontinue IV fluids.  Hypertension, at goal Continue home amlodipine.  Hyperlipidemia, stable Continue home statin  Chronic CHF with reduced EF Continue Coreg, holding Lasix, euvolemic on exam  NSVT Had 9 beat run on 1/9.  Potassium goal greater than 4, mag goal greater than 2.  Will replete as necessary  Hypomagnesemia Mag 1.6.  Repleted iwithIV.  Repeat in a.m.   Cultures:  Blood cultures negative ( 11/10/18)  Telemetry: yes  DVT prophylaxis: Consultants:   Treatment Team:   Lbcardiology, Rounding, MD   Procedures:  none   Antimicrobials:  Code Status: FULL   Family Communication: updated at bedside   Disposition Plan: s/p pharmacologic stress test, pending cardiology recommendations       Subjective No complaints, no chest pain, no abdominal pain, no diarreha  Objective: Vitals:   11/13/18 1038 11/13/18 1039 11/13/18 1040 11/13/18 1333  BP: 138/87 140/85 140/85 120/81  Pulse:    70  Resp:      Temp:    98.1 F (36.7 C)  TempSrc:    Oral  SpO2:    99%  Weight:      Height:        Intake/Output Summary (Last 24 hours) at 11/13/2018 1619 Last data filed at 11/13/2018 1300 Gross per 24 hour  Intake 2118.72 ml  Output 1260 ml  Net 858.72 ml   Filed Weights   11/12/18 0534 11/13/18 0419  Weight: 82.8 kg 83.2 kg    Exam:  Constitutional:normal appearing male, no distress Eyes: EOMI, anicteric, normal conjunctivae ENMT: Oropharynx with moist mucous membranes Neck: FROM Cardiovascular: RRR, systolic murmur with no peripheral edema Respiratory: Normal respiratory effort on room air, clear breath sounds  Abdomen: Soft,non-tender, normal  bowel sounds Skin: No rash ulcers, or lesions. Without skin tenting  Neurologic: Grossly no focal neuro deficit. Psychiatric:Appropriate affect, and mood. Mental status AAOx3  Data Reviewed: CBC: Recent Labs    Lab 11/10/18 2247 11/12/18 0414 11/13/18 0626  WBC 15.0* 9.6 8.4  NEUTROABS 10.8*  --  5.2  HGB 13.8 12.0* 11.1*  HCT 41.5 36.1* 32.8*  MCV 100.0 98.6 97.6  PLT 388 332 017   Basic Metabolic Panel: Recent Labs  Lab 11/10/18 2247 11/11/18 0045 11/12/18 0414 11/13/18 0626  NA 131*  --  136 137  K 2.9*  --  3.3* 3.5  CL 96*  --  105 106  CO2 22  --  24 22  GLUCOSE 132*  --  89 86  BUN 61*  --  38* 21*  CREATININE 4.78*  --  2.14* 1.50*  CALCIUM 9.2  --  8.7* 8.6*  MG  --  2.2  --  1.6*   GFR: Estimated Creatinine Clearance: 65.5 mL/min (A) (by C-G formula based on SCr of 1.5 mg/dL (H)). Liver Function Tests: Recent Labs  Lab 11/10/18 2247  AST 16  ALT 12  ALKPHOS 53  BILITOT 0.6  PROT 7.6  ALBUMIN 4.1   No results for input(s): LIPASE, AMYLASE in the last 168 hours. No results for input(s): AMMONIA in the last 168 hours. Coagulation Profile: No results for input(s): INR, PROTIME in the last 168 hours. Cardiac Enzymes: Recent Labs  Lab 11/11/18 0045 11/11/18 0726 11/11/18 1256 11/12/18 0842 11/12/18 1438 11/12/18 2025  CKTOTAL 135  --   --   --   --   --   TROPONINI  --  0.78* 0.60* 0.35* 0.22* 0.23*   BNP (last 3 results) No results for input(s): PROBNP in the last 8760 hours. HbA1C: No results for input(s): HGBA1C in the last 72 hours. CBG: No results for input(s): GLUCAP in the last 168 hours. Lipid Profile: No results for input(s): CHOL, HDL, LDLCALC, TRIG, CHOLHDL, LDLDIRECT in the last 72 hours. Thyroid Function Tests: No results for input(s): TSH, T4TOTAL, FREET4, T3FREE, THYROIDAB in the last 72 hours. Anemia Panel: No results for input(s): VITAMINB12, FOLATE, FERRITIN, TIBC, IRON, RETICCTPCT in the last 72 hours. Urine analysis:    Component Value Date/Time   COLORURINE YELLOW 11/11/2018 [place] 11/11/2018 1246   LABSPEC 1.016 11/11/2018 1246   PHURINE 5.0 11/11/2018 1246   GLUCOSEU NEGATIVE 11/11/2018 1246   HGBUR  MODERATE (A) 11/11/2018 1246   BILIRUBINUR NEGATIVE 11/11/2018 1246   KETONESUR 5 (A) 11/11/2018 1246   PROTEINUR NEGATIVE 11/11/2018 1246   NITRITE NEGATIVE 11/11/2018 1246   LEUKOCYTESUR NEGATIVE 11/11/2018 1246   Sepsis Labs: @LABRCNTIP (procalcitonin:4,lacticidven:4)  ) Recent Results (from the past 240 hour(s))  Culture, blood (routine x 2)     Status: None (Preliminary result)   Collection Time: 11/11/18 12:45 AM  Result Value Ref Range Status   Specimen Description   Final    BLOOD RIGHT ARM Performed at [facility], [address] [place], [place] [address]    Special Requests   Final    BOTTLES DRAWN AEROBIC AND ANAEROBIC Blood Culture results may not be optimal due to an inadequate volume of blood received in culture bottles Performed at [facility], [address] [place], [place] [address]    Culture   Final    NO GROWTH 2 DAYS Performed at [facility], [place] [address] [place], [place]  [address]    Report Status PENDING  Incomplete  Culture, blood (routine x 2)     Status: None (Preliminary result)   Collection Time: 11/11/18 12:50 AM  Result Value Ref Range Status   Specimen Description   Final    BLOOD RIGHT ARM Performed at [facility], [address] [place], [place] [address]    Special Requests   Final    BOTTLES DRAWN AEROBIC ONLY Blood Culture results may not be optimal due to an inadequate volume of blood received in culture bottles Performed at [facility], [address] [place], [place] [address]    Culture   Final    NO GROWTH 2 DAYS Performed at [facility], [place] [address] [place], [place] [address]    Report Status PENDING  Incomplete      Studies: Nm Myocar Multi W/spect W/wall Motion / Ef  Result Date: 11/13/2018 CLINICAL DATA:  Congestive heart failure. Hyperlipidemia, hypertension, smoking, and family history of coronary artery disease. EXAM: MYOCARDIAL IMAGING WITH SPECT  (REST AND PHARMACOLOGIC-STRESS) GATED LEFT VENTRICULAR WALL MOTION STUDY LEFT VENTRICULAR EJECTION FRACTION TECHNIQUE: Standard myocardial SPECT imaging was performed after resting intravenous injection of 10 mCi Tc-64m tetrofosmin. Subsequently, intravenous infusion of Lexiscan was performed under the supervision of the Cardiology staff. At peak effect of the drug, 30 mCi Tc-67m tetrofosmin was injected intravenously and standard myocardial SPECT imaging was performed. Quantitative gated imaging was also performed to evaluate left ventricular wall motion, and estimate left ventricular ejection fraction. COMPARISON:  None. FINDINGS: Perfusion: A large fixed myocardial perfusion defect is seen involving the inferior wall of the left ventricle on both stress and resting images, which shows no evidence of reversibility. No reversible myocardial perfusion defects are seen. Wall Motion: Inferior wall akinesis of left ventricle demonstrated. Moderate to severe left ventricular dilatation. Left Ventricular Ejection Fraction: 34 % End diastolic volume 195 ml End systolic volume 093 ml IMPRESSION: 1. No evidence of pharmacologic induced myocardial ischemia. Large inferior wall myocardial infarct. 2. Inferior wall akinesis of left ventricle, with moderate to severe dilatation. 3. Left ventricular ejection fraction 34% 4. Non invasive risk stratification*: High *2012 Appropriate Use Criteria for Coronary Revascularization Focused Update: J Am Coll Cardiol. 2671;24(5):809-983. http://content.airportbarriers.com.aspx?articleid=[email or web address] Electronically Signed   By: Earle Gell M.D.   On: 11/13/2018 13:10    Scheduled Meds: . amLODipine  10 mg Oral Daily  . aspirin  81 mg Oral Daily  . carvedilol  12.5 mg Oral BID WC  . heparin  5,000 Units Subcutaneous Q8H  . regadenoson  0.4 mg Intravenous Once  . rosuvastatin  40 mg Oral q1800  . [START ON 11/15/2018] Vitamin D (Ergocalciferol)  50,000 Units Oral Weekly     Continuous Infusions: . sodium chloride 75 mL/hr at 11/13/18 0700  . lactated ringers 75 mL/hr at 11/11/18 1228     LOS: 2 days     Desiree Hane, MD [facility]

## 2018-11-13 NOTE — Progress Notes (Signed)
Patient had a 15 beat run of ventricular tachycardia, asymptomatic.  No labs ordered as of yet for the morning, [facility] paged with this information.

## 2018-11-13 NOTE — Progress Notes (Addendum)
Progress Note  Patient Name: Darius Rivers Date of Encounter: 11/13/2018  Primary Cardiologist:  Pixie Casino, MD  Subjective   No more CP or SOB  Inpatient Medications    Scheduled Meds: . amLODipine  10 mg Oral Daily  . carvedilol  12.5 mg Oral BID WC  . heparin  5,000 Units Subcutaneous Q8H  . rosuvastatin  5 mg Oral q1800  . [START ON 11/15/2018] Vitamin D (Ergocalciferol)  50,000 Units Oral Weekly   Continuous Infusions: . sodium chloride 75 mL/hr at 11/13/18 0700  . lactated ringers 75 mL/hr at 11/11/18 1228  . magnesium sulfate 1 - 4 g bolus IVPB 2 g (11/13/18 0820)   PRN Meds: bisacodyl, docusate sodium, ondansetron **OR** ondansetron (ZOFRAN) IV   Vital Signs    Vitals:   11/12/18 0801 11/12/18 1500 11/12/18 2015 11/13/18 0419  BP: (!) 132/92 124/90 (!) 121/93 128/87  Pulse: 71  68 75  Resp:   20 16  Temp: 98.4 F (36.9 C) 98.2 F (36.8 C) 97.6 F (36.4 C) 98.3 F (36.8 C)  TempSrc: Oral Oral Oral Oral  SpO2:   100% 100%  Weight:    83.2 kg  Height:        Intake/Output Summary (Last 24 hours) at 11/13/2018 0820 Last data filed at 11/13/2018 4818 Gross per 24 hour  Intake 2158.72 ml  Output 2035 ml  Net 123.72 ml   Filed Weights   11/12/18 0534 11/13/18 0419  Weight: 82.8 kg 83.2 kg    Telemetry    SR w/ 2 runs NSVT, one was almost 13 sec - Personally Reviewed  ECG    None today - Personally Reviewed  Physical Exam   General: Well developed, well nourished, male appearing in no acute distress. Head: Normocephalic, atraumatic.  Neck: Supple without bruits, JVD not elevated. Lungs:  Resp regular and unlabored, CTA. Heart: RRR, S1, S2, no S3, S4, 2/6 murmur; no rub. Abdomen: Soft, non-tender, non-distended with normoactive bowel sounds. No hepatomegaly. No rebound/guarding. No obvious abdominal masses. Extremities: No clubbing, cyanosis, no edema. Distal pedal pulses are 2+ bilaterally. Neuro: Alert and oriented X 3. Moves all  extremities spontaneously. Psych: Normal affect.  Labs    Hematology Recent Labs  Lab 11/10/18 2247 11/12/18 0414 11/13/18 0626  WBC 15.0* 9.6 8.4  RBC 4.15* 3.66* 3.36*  HGB 13.8 12.0* 11.1*  HCT 41.5 36.1* 32.8*  MCV 100.0 98.6 97.6  MCH 33.3 32.8 33.0  MCHC 33.3 33.2 33.8  RDW 12.8 12.4 12.3  PLT 388 332 312    Chemistry Recent Labs  Lab 11/10/18 2247 11/12/18 0414 11/13/18 0626  NA 131* 136 137  K 2.9* 3.3* 3.5  CL 96* 105 106  CO2 22 24 22   GLUCOSE 132* 89 86  BUN 61* 38* 21*  CREATININE 4.78* 2.14* 1.50*  CALCIUM 9.2 8.7* 8.6*  PROT 7.6  --   --   ALBUMIN 4.1  --   --   AST 16  --   --   ALT 12  --   --   ALKPHOS 53  --   --   BILITOT 0.6  --   --   GFRNONAA 13* 34* 52*  GFRAA 15* 39* 60*  ANIONGAP 13 7 9      Cardiac Enzymes Recent Labs  Lab 11/11/18 1256 11/12/18 0842 11/12/18 1438 11/12/18 2025  TROPONINI 0.60* 0.35* 0.22* 0.23*   No results for input(s): TROPIPOC in the last 168 hours.   No results  found for: CHOL, HDL, LDLCALC, LDLDIRECT, TRIG, CHOLHDL   Radiology    Ct Abdomen Pelvis Wo Contrast  Result Date: 11/11/2018 CLINICAL DATA:  Dizziness and dyspnea x4 days. Acute renal failure. Hypertension. EXAM: CT ABDOMEN AND PELVIS WITHOUT CONTRAST TECHNIQUE: Multidetector CT imaging of the abdomen and pelvis was performed following the standard protocol without IV contrast. COMPARISON:  09/09/2016 FINDINGS: LOWER CHEST: Lung bases are clear. Included heart size is normal. No pericardial effusion. HEPATOBILIARY: Stable hypodensities scattered throughout the liver, the largest in the left hepatic lobe measuring 17 mm and on the right measuring 14 mm. These are unchanged and likely to represent small cysts or hemangiomata. No intrahepatic ductal dilatation. Physiologic distention of gallbladder without stones. PANCREAS: No inflammation or ductal dilatation. No mass given limitations of a noncontrast study. SPLEEN: Normal size spleen without mass.  ADRENALS/URINARY TRACT: Kidneys are orthotopic. Bilateral nonobstructing renal calculi. No hydroureteronephrosis nor ureteral calculus. Urinary bladder is partially decompressed which may account for the slightly thickened appearance of the bladder wall. Cystitis is not entirely excluded but believed less likely. Clinical correlation is suggested. Normal adrenal glands. STOMACH/BOWEL: Large amount retained stool is seen throughout much of the colon admixed with hyperdense contrast or bismuth containing medication. No small or large bowel obstruction. The appendix is not confidently identified no pericecal inflammation is seen. The stomach is physiologically distended with small hiatal hernia. Normal small bowel rotation is noted. VASCULAR/LYMPHATIC: Moderate aortoiliac and branch vessel atherosclerosis. No lymphadenopathy. REPRODUCTIVE: Enlarged prostate measuring up to 5.8 cm transverse. OTHER: Small umbilical hernia with herniation of a small segment of nonincarcerated small bowel, series 2/49. MUSCULOSKELETAL: Nonacute. IMPRESSION: 1. Bilateral nonobstructing renal calculi. No obstructive uropathy. 2. Slightly thickened appearance of the bladder wall which may be due to underdistention. Cystitis is not entirely excluded but believed less likely. 3. Stable hypodensities scattered throughout the liver likely to represent cysts or hemangiomata. 4. Small umbilical hernia containing a small segment of nonincarcerated small bowel. No incarceration or obstruction. Electronically Signed   By: Ashley Royalty M.D.   On: 11/11/2018 00:53   Dg Chest Port 1 View  Result Date: 11/10/2018 CLINICAL DATA:  Dizziness and dyspnea since 11/07/2017 EXAM: PORTABLE CHEST 1 VIEW COMPARISON:  CT 09/09/2016, CXR 09/08/2016 FINDINGS: The cardiopericardial silhouette is within normal limits. Mild prominence of the ascending portion of the thoracic aorta common consistent with known ascending thoracic aortic aneurysm. Both lungs are clear.  The visualized skeletal structures are unremarkable. IMPRESSION: No active disease. Stable mild prominence of the mediastinum secondary to known ascending thoracic aortic aneurysm without change. Electronically Signed   By: Ashley Royalty M.D.   On: 11/10/2018 23:14     Cardiac Studies   ECHO:  11/12/2018 - Left ventricle: The cavity size was mildly dilated. There was   mild concentric hypertrophy. Systolic function was mildly to   moderately reduced. The estimated ejection fraction was in the   range of 40% to 45%. Diffuse hypokinesis. Doppler parameters are   consistent with abnormal left ventricular relaxation (grade 1   diastolic dysfunction). - Aortic valve: There was mild to moderate regurgitation directed   centrally in the LVOT. - Pericardium, extracardiac: A trivial pericardial effusion was   identified.  Patient Profile     58 y.o. male w/ hx  NICM, chronic systolic HF, nonobstructive CAD, VT, ascending aortic aneurysm , HTN, LVH, HLD, CKD, h/o prostate cancer and nephrolithiasis, was admitted 01/09 w/ AKI and ?NSTEMI. Cards saw 01/10, see note.  Assessment & Plan  1. ACS:  - elevated cardiac ez w/ downward trend, c/w ACS per Dr Debara Pickett - EF decreased but no WMA - Dr Lysbeth Penner note states no cath at this time, but BUN/Cr significantly improved. - MD review and advise if plan cath Monday or MV today to risk stratify. - on BB, add ASA. - increase statin to high-dose - ck lipids in am  Otherwise, per IM Principal Problem:   AKI (acute kidney injury) ([place]) Active Problems:   Troponin I above reference range   Hypotension   Dizziness   Hyperlipidemia   Chronic systolic CHF (congestive heart failure) (HCC)   CKD (chronic kidney disease), stage III (HCC)   Hyponatremia   Hypokalemia   Lactic acid increased   Bilateral nephrolithiasis    Signed, Rosaria Ferries , PA-C 8:20 AM 11/13/2018 Pager: [phone]  History and all data above reviewed.  Patient examined.  I  agree with the findings as above.  No pain.  No SOB. The patient exam reveals COR:RRR  ,  Lungs:   ,  Abd: Positive bowel sounds, no rebound no guarding, Ext No edema  .  All available labs, radiology testing, previous records reviewed. Agree with documented assessment and plan. Elevated enzymes:  Plan Lexiscan Myoview. Today.    Minus Breeding  8:32 AM  11/13/2018

## 2018-11-13 NOTE — Progress Notes (Signed)
   Darius Rivers presented for a nuclear stress test today.  No immediate complications.  Stress imaging is pending at this time.  Preliminary EKG findings may be listed in the chart, but the stress test result will not be finalized until perfusion imaging is complete.  1 day study, [place] to read.  Rosaria Ferries, PA-C 11/13/2018, 10:35 AM

## 2018-11-14 DIAGNOSIS — I4729 Other ventricular tachycardia: Secondary | ICD-10-CM

## 2018-11-14 DIAGNOSIS — I472 Ventricular tachycardia: Secondary | ICD-10-CM

## 2018-11-14 LAB — BASIC METABOLIC PANEL
Anion gap: 7 (ref 5–15)
BUN: 16 mg/dL (ref 6–20)
CHLORIDE: 105 mmol/L (ref 98–111)
CO2: 23 mmol/L (ref 22–32)
Calcium: 8.2 mg/dL — ABNORMAL LOW (ref 8.9–10.3)
Creatinine, Ser: 1.34 mg/dL — ABNORMAL HIGH (ref 0.61–1.24)
GFR calc Af Amer: >60 mL/min (ref >60)
GFR calc non Af Amer: 59 mL/min — ABNORMAL LOW (ref >60)
Glucose, Bld: 92 mg/dL (ref 70–99)
POTASSIUM: 3.5 mmol/L (ref 3.5–5.1)
Sodium: 135 mmol/L (ref 135–145)

## 2018-11-14 LAB — MAGNESIUM: Magnesium: 1.7 mg/dL (ref 1.7–2.4)

## 2018-11-14 MED ORDER — AMLODIPINE BESYLATE 5 MG PO TABS
5.0000 mg | ORAL_TABLET | Freq: Every day | ORAL | Status: DC
  Administered 2018-11-14 – 2018-11-15 (×2): 5 mg via ORAL
  Filled 2018-11-14 (×2): qty 1

## 2018-11-14 MED ORDER — CARVEDILOL 6.25 MG PO TABS
18.7500 mg | ORAL_TABLET | Freq: Two times a day (BID) | ORAL | Status: DC
  Administered 2018-11-14 – 2018-11-15 (×4): 18.75 mg via ORAL
  Filled 2018-11-14 (×4): qty 1

## 2018-11-14 MED ORDER — POTASSIUM CHLORIDE CRYS ER 20 MEQ PO TBCR
40.0000 meq | EXTENDED_RELEASE_TABLET | Freq: Once | ORAL | Status: AC
  Administered 2018-11-14: 40 meq via ORAL
  Filled 2018-11-14: qty 2

## 2018-11-14 MED ORDER — MAGNESIUM SULFATE 2 GM/50ML IV SOLN
2.0000 g | Freq: Once | INTRAVENOUS | Status: AC
  Administered 2018-11-14: 2 g via INTRAVENOUS
  Filled 2018-11-14: qty 50

## 2018-11-14 MED ORDER — MAGNESIUM OXIDE 400 (241.3 MG) MG PO TABS
800.0000 mg | ORAL_TABLET | Freq: Two times a day (BID) | ORAL | Status: AC
  Administered 2018-11-14 (×2): 800 mg via ORAL
  Filled 2018-11-14 (×2): qty 2

## 2018-11-14 NOTE — Progress Notes (Signed)
PROGRESS NOTE  Darius Rivers HAL:[id number] DOB: 05/28/1961 DOA: 11/10/2018 PCP: System, Pcp Not In  HPI/Brief Narrative  Darius Rivers is a 58 y.o. year old male with medical history significant for nonischemic cardiomyopathy (EF 40-45%), HLD, HTN, prostate ca (2018) recent diagnosis of gastroenteritis during ED visit on 1/3 where at that time he received IV fluids and antiemeticswho presented on 11/10/2018 with persistent nausea, abdominal pain, progressive dyspnea aand was found to have profound hypotension and lactic acidosis responsive to IV fluids.  His creatinine was also quite elevated to 4.78 (baseline 1-1 0.1) and had an initial troponin of 1.57.  CT abdomen for abdominal pain evaluation showed no obstructive uropathy with slightly thickened bladder wall but otherwise no acute abnormalities.  His troponin down trended, but cardiology was concerned for acute coronary syndrome rather than demand ischemia.  TTE showed EF of 40-45% (similar to baseline, TTE, 09/2016 at [facility]) with diffuse hypokinesis.  Given poor kidney function patient was not a left heart cath candidate, however creatinine improved quickly to 1.5 at which time patient underwent pharmacological stress test.  Test was significant for large fixed myocardial perfusion defect with no evidence of reversibility, noninvasive or stratification: High.   Assessment/Plan:  Elevated troponin, with ACS rule out.  Troponin trend has gone down, patient without chest pain.  Diffuse hypokinesis on echo, corroborated on pharmacologic stress test with no reversible defect cardiology recommends medical management, will decrease amlodipine and increase beta-blocker.  Asymptomatic NSVT Had 20 beat run of V. tach this a.m. Optimize magnesium, optimizing potassium with goal of 2 and 4 respectively.,  Continue to monitor on telemetry  AKI on CKD, improving Baseline creatinine 1.16, peak creatinine of 4.78, has significantly improved with IV  fluids currently 1.34, consistent with prerenal etiology due to volume loss from gastroparesis  Hypotension, resolved Likely in the setting of recently diagnosed gastroenteritis.  Maintain normal blood pressure with systolics in the 735H.  Will discontinue IV fluids.  Hypertension, at goal Continue home amlodipine.  Hyperlipidemia, stable Continue home statin  Chronic CHF with reduced EF Continue Coreg (increased dose per cardiology), holding Lasix, euvolemic on exam  Hypomagnesemia Mag 1.7.  This a.m. we will replete with IV, repeat in p.m.   Cultures:  Blood cultures negative ( 11/10/18)  Telemetry: yes  DVT prophylaxis: Consultants:  Treatment Team:   Lbcardiology, Round[place], MD   Procedures:  none   Antimicrobials:  Code Status: FULL   Family Communication: No family at bedside  Disposition Plan: Correction of magnesium, potassium, ensure improvement in NSVT with increase in beta-blocker, continue to monitor on telemetry for additional 24 hours expect discharge at that point.      Subjective No complaints, no chest pain, no abdominal pain, no diarreha  Objective: Vitals:   11/13/18 1040 11/13/18 1333 11/13/18 2011 11/14/18 0339  BP: 140/85 120/81 110/78 110/77  Pulse:  70 72 65  Resp:   19 16  Temp:  98.1 F (36.7 C) 98.7 F (37.1 C) 98 F (36.7 C)  TempSrc:  Oral Oral Oral  SpO2:  99% 100% 99%  Weight:    83.7 kg  Height:        Intake/Output Summary (Last 24 hours) at 11/14/2018 2992 Last data filed at 11/14/2018 0700 Gross per 24 hour  Intake 2038.5 ml  Output 700 ml  Net 1338.5 ml   Filed Weights   11/12/18 0534 11/13/18 0419 11/14/18 0339  Weight: 82.8 kg 83.2 kg 83.7 kg    Exam:  Constitutional:normal  appearing male, no distress Eyes: EOMI, anicteric, normal conjunctivae ENMT: Oropharynx with moist mucous membranes Neck: FROM Cardiovascular: RRR, systolic murmur with no peripheral edema Respiratory: Normal respiratory effort on  room air, clear breath sounds  Abdomen: Soft,non-tender, normal bowel sounds Skin: No rash ulcers, or lesions. Without skin tenting  Neurologic: Grossly no focal neuro deficit. Psychiatric:Appropriate affect, and mood. Mental status AAOx3  Data Reviewed: CBC: Recent Labs  Lab 11/10/18 2247 11/12/18 0414 11/13/18 0626  WBC 15.0* 9.6 8.4  NEUTROABS 10.8*  --  5.2  HGB 13.8 12.0* 11.1*  HCT 41.5 36.1* 32.8*  MCV 100.0 98.6 97.6  PLT 388 332 329   Basic Metabolic Panel: Recent Labs  Lab 11/10/18 2247 11/11/18 0045 11/12/18 0414 11/13/18 0626 11/14/18 0336  NA 131*  --  136 137 135  K 2.9*  --  3.3* 3.5 3.5  CL 96*  --  105 106 105  CO2 22  --  24 22 23   GLUCOSE 132*  --  89 86 92  BUN 61*  --  38* 21* 16  CREATININE 4.78*  --  2.14* 1.50* 1.34*  CALCIUM 9.2  --  8.7* 8.6* 8.2*  MG  --  2.2  --  1.6* 1.7   GFR: Estimated Creatinine Clearance: 73.7 mL/min (A) (by C-G formula based on SCr of 1.34 mg/dL (H)). Liver Function Tests: Recent Labs  Lab 11/10/18 2247  AST 16  ALT 12  ALKPHOS 53  BILITOT 0.6  PROT 7.6  ALBUMIN 4.1   No results for input(s): LIPASE, AMYLASE in the last 168 hours. No results for input(s): AMMONIA in the last 168 hours. Coagulation Profile: No results for input(s): INR, PROTIME in the last 168 hours. Cardiac Enzymes: Recent Labs  Lab 11/11/18 0045 11/11/18 0726 11/11/18 1256 11/12/18 0842 11/12/18 1438 11/12/18 2025  CKTOTAL 135  --   --   --   --   --   TROPONINI  --  0.78* 0.60* 0.35* 0.22* 0.23*   BNP (last 3 results) No results for input(s): PROBNP in the last 8760 hours. HbA1C: No results for input(s): HGBA1C in the last 72 hours. CBG: No results for input(s): GLUCAP in the last 168 hours. Lipid Profile: No results for input(s): CHOL, HDL, LDLCALC, TRIG, CHOLHDL, LDLDIRECT in the last 72 hours. Thyroid Function Tests: No results for input(s): TSH, T4TOTAL, FREET4, T3FREE, THYROIDAB in the last 72 hours. Anemia  Panel: No results for input(s): VITAMINB12, FOLATE, FERRITIN, TIBC, IRON, RETICCTPCT in the last 72 hours. Urine analysis:    Component Value Date/Time   COLORURINE YELLOW 11/11/2018 [place] 11/11/2018 1246   LABSPEC 1.016 11/11/2018 1246   PHURINE 5.0 11/11/2018 1246   GLUCOSEU NEGATIVE 11/11/2018 1246   HGBUR MODERATE (A) 11/11/2018 1246   BILIRUBINUR NEGATIVE 11/11/2018 1246   KETONESUR 5 (A) 11/11/2018 1246   PROTEINUR NEGATIVE 11/11/2018 1246   NITRITE NEGATIVE 11/11/2018 1246   LEUKOCYTESUR NEGATIVE 11/11/2018 1246   Sepsis Labs: @LABRCNTIP (procalcitonin:4,lacticidven:4)  ) Recent Results (from the past 240 hour(s))  Culture, blood (routine x 2)     Status: None (Preliminary result)   Collection Time: 11/11/18 12:45 AM  Result Value Ref Range Status   Specimen Description   Final    BLOOD RIGHT ARM Performed at [facility], [address] [place], [place] [address]    Special Requests   Final    BOTTLES DRAWN AEROBIC AND ANAEROBIC Blood Culture results may not be optimal due to  an inadequate volume of blood received in culture bottles Performed at [facility], [address] [place], [place] [address]    Culture   Final    NO GROWTH 2 DAYS Performed at [facility], [place] [address], [place], [place] [address]    Report Status PENDING  Incomplete  Culture, blood (routine x 2)     Status: None (Preliminary result)   Collection Time: 11/11/18 12:50 AM  Result Value Ref Range Status   Specimen Description   Final    BLOOD RIGHT ARM Performed at [facility], [address] [place], [place] [address]    Special Requests   Final    BOTTLES DRAWN AEROBIC ONLY Blood Culture results may not be optimal due to an inadequate volume of blood received in culture bottles Performed at [facility], [address] [place], [place] [address]    Culture   Final    NO GROWTH 2 DAYS Performed at [facility], [place] [address], [place],  [address]    Report Status PENDING  Incomplete      Studies: Nm Myocar Multi W/spect W/wall Motion / Ef  Result Date: 11/13/2018 CLINICAL DATA:  Congestive heart failure. Hyperlipidemia, hypertension, smoking, and family history of coronary artery disease. EXAM: MYOCARDIAL IMAGING WITH SPECT (REST AND PHARMACOLOGIC-STRESS) GATED LEFT VENTRICULAR WALL MOTION STUDY LEFT VENTRICULAR EJECTION FRACTION TECHNIQUE: Standard myocardial SPECT imaging was performed after resting intravenous injection of 10 mCi Tc-4m tetrofosmin. Subsequently, intravenous infusion of Lexiscan was performed under the supervision of the Cardiology staff. At peak effect of the drug, 30 mCi Tc-59m tetrofosmin was injected intravenously and standard myocardial SPECT imaging was performed. Quantitative gated imaging was also performed to evaluate left ventricular wall motion, and estimate left ventricular ejection fraction. COMPARISON:  None. FINDINGS: Perfusion: A large fixed myocardial perfusion defect is seen involving the inferior wall of the left ventricle on both stress and resting images, which shows no evidence of reversibility. No reversible myocardial perfusion defects are seen. Wall Motion: Inferior wall akinesis of left ventricle demonstrated. Moderate to severe left ventricular dilatation. Left Ventricular Ejection Fraction: 34 % End diastolic volume 778 ml End systolic volume 242 ml IMPRESSION: 1. No evidence of pharmacologic induced myocardial ischemia. Large inferior wall myocardial infarct. 2. Inferior wall akinesis of left ventricle, with moderate to severe dilatation. 3. Left ventricular ejection fraction 34% 4. Non invasive risk stratification*: High *2012 Appropriate Use Criteria for Coronary Revascularization Focused Update: J Am Coll Cardiol. 3536;14(4):315-400. http://content.airportbarriers.com.aspx?articleid=[email or web address] Electronically Signed   By: Earle Gell M.D.   On:  11/13/2018 13:10    Scheduled Meds: . amLODipine  5 mg Oral Daily  . aspirin  81 mg Oral Daily  . carvedilol  18.75 mg Oral BID WC  . heparin  5,000 Units Subcutaneous Q8H  . magnesium oxide  800 mg Oral BID  . potassium chloride  40 mEq Oral Once  . regadenoson  0.4 mg Intravenous Once  . rosuvastatin  40 mg Oral q1800  . [START ON 11/15/2018] Vitamin D (Ergocalciferol)  50,000 Units Oral Weekly    Continuous Infusions: . sodium chloride 75 mL/hr at 11/14/18 0600  . lactated ringers 75 mL/hr at 11/11/18 1228     LOS: 3 days     Desiree Hane, MD [facility]

## 2018-11-14 NOTE — Progress Notes (Signed)
Progress Note  Patient Name: Darius Rivers Date of Encounter: 11/14/2018  Primary Cardiologist:   Pixie Casino, MD   Subjective   He feels well.  Denies chest pain or SOB.  No N/V.  He did not feel the NSVT that he had last night.   Inpatient Medications    Scheduled Meds: . amLODipine  10 mg Oral Daily  . aspirin  81 mg Oral Daily  . carvedilol  12.5 mg Oral BID WC  . heparin  5,000 Units Subcutaneous Q8H  . regadenoson  0.4 mg Intravenous Once  . rosuvastatin  40 mg Oral q1800  . [START ON 11/15/2018] Vitamin D (Ergocalciferol)  50,000 Units Oral Weekly   Continuous Infusions: . sodium chloride 75 mL/hr at 11/14/18 0600  . lactated ringers 75 mL/hr at 11/11/18 1228  . magnesium sulfate 1 - 4 g bolus IVPB 2 g (11/14/18 0650)   PRN Meds: bisacodyl, docusate sodium, ondansetron **OR** ondansetron (ZOFRAN) IV   Vital Signs    Vitals:   11/13/18 1040 11/13/18 1333 11/13/18 2011 11/14/18 0339  BP: 140/85 120/81 110/78 110/77  Pulse:  70 72 65  Resp:   19 16  Temp:  98.1 F (36.7 C) 98.7 F (37.1 C) 98 F (36.7 C)  TempSrc:  Oral Oral Oral  SpO2:  99% 100% 99%  Weight:    83.7 kg  Height:        Intake/Output Summary (Last 24 hours) at 11/14/2018 0739 Last data filed at 11/14/2018 0700 Gross per 24 hour  Intake 2038.5 ml  Output 850 ml  Net 1188.5 ml   Filed Weights   11/12/18 0534 11/13/18 0419 11/14/18 0339  Weight: 82.8 kg 83.2 kg 83.7 kg    Telemetry    NSVT, NSR - Personally Reviewed  ECG    NA - Personally Reviewed  Physical Exam   GEN: No acute distress.   Neck: No  JVD Cardiac: RRR, no murmurs, rubs, or gallops.  Respiratory: Clear  to auscultation bilaterally. GI: Soft, nontender, non-distended  MS: No  edema; No deformity. Neuro:  Nonfocal  Psych: Normal affect   Labs    Chemistry Recent Labs  Lab 11/10/18 2247 11/12/18 0414 11/13/18 0626 11/14/18 0336  NA 131* 136 137 135  K 2.9* 3.3* 3.5 3.5  CL 96* 105 106 105    CO2 22 24 22 23   GLUCOSE 132* 89 86 92  BUN 61* 38* 21* 16  CREATININE 4.78* 2.14* 1.50* 1.34*  CALCIUM 9.2 8.7* 8.6* 8.2*  PROT 7.6  --   --   --   ALBUMIN 4.1  --   --   --   AST 16  --   --   --   ALT 12  --   --   --   ALKPHOS 53  --   --   --   BILITOT 0.6  --   --   --   GFRNONAA 13* 34* 52* 59*  GFRAA 15* 39* 60* >60  ANIONGAP 13 7 9 7      Hematology Recent Labs  Lab 11/10/18 2247 11/12/18 0414 11/13/18 0626  WBC 15.0* 9.6 8.4  RBC 4.15* 3.66* 3.36*  HGB 13.8 12.0* 11.1*  HCT 41.5 36.1* 32.8*  MCV 100.0 98.6 97.6  MCH 33.3 32.8 33.0  MCHC 33.3 33.2 33.8  RDW 12.8 12.4 12.3  PLT 388 332 312    Cardiac Enzymes Recent Labs  Lab 11/11/18 1256 11/12/18 0842 11/12/18 1438 11/12/18 2025  TROPONINI 0.60* 0.35* 0.22* 0.23*   No results for input(s): TROPIPOC in the last 168 hours.   BNPNo results for input(s): BNP, PROBNP in the last 168 hours.   DDimer No results for input(s): DDIMER in the last 168 hours.   Radiology    Nm Myocar Multi W/spect W/wall Motion / Ef  Result Date: 11/13/2018 CLINICAL DATA:  Congestive heart failure. Hyperlipidemia, hypertension, smoking, and family history of coronary artery disease. EXAM: MYOCARDIAL IMAGING WITH SPECT (REST AND PHARMACOLOGIC-STRESS) GATED LEFT VENTRICULAR WALL MOTION STUDY LEFT VENTRICULAR EJECTION FRACTION TECHNIQUE: Standard myocardial SPECT imaging was performed after resting intravenous injection of 10 mCi Tc-27m tetrofosmin. Subsequently, intravenous infusion of Lexiscan was performed under the supervision of the Cardiology staff. At peak effect of the drug, 30 mCi Tc-51m tetrofosmin was injected intravenously and standard myocardial SPECT imaging was performed. Quantitative gated imaging was also performed to evaluate left ventricular wall motion, and estimate left ventricular ejection fraction. COMPARISON:  None. FINDINGS: Perfusion: A large fixed myocardial perfusion defect is seen involving the inferior wall  of the left ventricle on both stress and resting images, which shows no evidence of reversibility. No reversible myocardial perfusion defects are seen. Wall Motion: Inferior wall akinesis of left ventricle demonstrated. Moderate to severe left ventricular dilatation. Left Ventricular Ejection Fraction: 34 % End diastolic volume 174 ml End systolic volume 944 ml IMPRESSION: 1. No evidence of pharmacologic induced myocardial ischemia. Large inferior wall myocardial infarct. 2. Inferior wall akinesis of left ventricle, with moderate to severe dilatation. 3. Left ventricular ejection fraction 34% 4. Non invasive risk stratification*: High *2012 Appropriate Use Criteria for Coronary Revascularization Focused Update: J Am Coll Cardiol. 9675;91(6):384-665. http://content.airportbarriers.com.aspx?articleid=[email or web address] Electronically Signed   By: Earle Gell M.D.   On: 11/13/2018 13:10    Cardiac Studies   ECHO:  11/12/2018 - Left ventricle: The cavity size was mildly dilated. There was mild concentric hypertrophy. Systolic function was mildly to moderately reduced. The estimated ejection fraction was in the range of 40% to 45%. Diffuse hypokinesis. Doppler parameters are consistent with abnormal left ventricular relaxation (grade 1 diastolic dysfunction). - Aortic valve: There was mild to moderate regurgitation directed centrally in the LVOT. - Pericardium, extracardiac: A trivial pericardial effusion was identified.   Lexiscan Myoview.  IMPRESSION: 1. No evidence of pharmacologic induced myocardial ischemia.  Give another dose of potassium today.   Give magnesium today.   2. Inferior wall akinesis of left ventricle, with moderate to severe dilatation.  3. Left ventricular ejection fraction 34%  4. Non invasive risk stratification*: High   Patient Profile     58 y.o. male w/ hx NICM, chronic systolic HF, nonobstructive CAD, VT, ascending aortic aneurysm , HTN, LVH, HLD,  CKD, h/o prostate cancer and nephrolithiasis, was admitted 01/09 w/ AKI and ?NSTEMI. Cards saw 01/10, see note.   Assessment & Plan    ACS:    Fixed defect as above on the [facility].   No indication for cath.  Medical management.   AKI:  Creat continues to improve.  CARDMIOMYOPATHY:   Will be managed as below NSVT:   I will increase the beta blocker and reduce the Norvasc.   If his HR allows as an outpatient when he is seen back we can stop the Norvasc and increase the beta blocker.    For questions or updates, please contact [place] Please consult [email or web address] for contact info under Cardiology/STEMI.   Signed, Minus Breeding, MD  11/14/2018, 7:39 AM

## 2018-11-14 NOTE — Progress Notes (Signed)
Patient had a 20 beat run of ventricular tachycardia, asymptomatic.  [facility] paged with this information.

## 2018-11-15 ENCOUNTER — Encounter (HOSPITAL_COMMUNITY): Payer: Self-pay | Admitting: General Practice

## 2018-11-15 DIAGNOSIS — E872 Acidosis: Secondary | ICD-10-CM

## 2018-11-15 DIAGNOSIS — I428 Other cardiomyopathies: Secondary | ICD-10-CM

## 2018-11-15 DIAGNOSIS — E871 Hypo-osmolality and hyponatremia: Secondary | ICD-10-CM

## 2018-11-15 LAB — BASIC METABOLIC PANEL
Anion gap: 5 (ref 5–15)
BUN: 11 mg/dL (ref 6–20)
CO2: 23 mmol/L (ref 22–32)
Calcium: 8.4 mg/dL — ABNORMAL LOW (ref 8.9–10.3)
Chloride: 110 mmol/L (ref 98–111)
Creatinine, Ser: 1.26 mg/dL — ABNORMAL HIGH (ref 0.61–1.24)
GFR calc Af Amer: >60 mL/min (ref >60)
Glucose, Bld: 89 mg/dL (ref 70–99)
POTASSIUM: 4.2 mmol/L (ref 3.5–5.1)
Sodium: 138 mmol/L (ref 135–145)

## 2018-11-15 LAB — MAGNESIUM: Magnesium: 1.6 mg/dL — ABNORMAL LOW (ref 1.7–2.4)

## 2018-11-15 MED ORDER — LISINOPRIL 40 MG PO TABS: ORAL_TABLET | ORAL | Status: DC

## 2018-11-15 MED ORDER — MAGNESIUM SULFATE 4 GM/100ML IV SOLN
4.0000 g | Freq: Once | INTRAVENOUS | Status: AC
  Administered 2018-11-15: 4 g via INTRAVENOUS
  Filled 2018-11-15: qty 100

## 2018-11-15 MED ORDER — DOCUSATE SODIUM 100 MG PO CAPS: 100.0000 mg | ORAL_CAPSULE | Freq: Two times a day (BID) | ORAL | 0 refills | Status: DC | PRN

## 2018-11-15 MED ORDER — CARVEDILOL 6.25 MG PO TABS: 18.7500 mg | ORAL_TABLET | Freq: Two times a day (BID) | ORAL | 0 refills | Status: DC

## 2018-11-15 MED ORDER — ROSUVASTATIN CALCIUM 40 MG PO TABS: 40.0000 mg | ORAL_TABLET | Freq: Every day | ORAL | 0 refills | Status: DC

## 2018-11-15 NOTE — Progress Notes (Signed)
Patient had a 9 beat run of ventricular tachycardia, asymptomatic.

## 2018-11-15 NOTE — Discharge Summary (Signed)
Discharge Summary  Darius Rivers WGY:[id number] DOB: 02-Jun-1961  PCP: Fortino Sic, PA  Admit date: 11/10/2018 Discharge date: 11/17/2018   Time spent: < 25 minutes  Admitted From: home Disposition:  home  Recommendations for Outpatient Follow-up:  1. Follow up with PCP in 1 week for BMP (creatinine), mag, BP check 2. New medications/changes: Carvedilol increased 18.75 mg twice daily, Crestor 40 mg, holding lisinopril (until creatinine check at PCP) 3. Outpatient follow-up: Follow-up arranged by cardiology with Dr. Debara Pickett    Discharge Diagnoses:  Active Hospital Problems   Diagnosis Date Noted  . AKI (acute kidney injury) ([place]) 11/11/2018  . NSVT (nonsustained ventricular tachycardia) ([place]) 11/14/2018  . Troponin I above reference range 11/11/2018  . Hypotension 11/11/2018  . Dizziness 11/11/2018  . Hyperlipidemia 11/11/2018  . Chronic systolic CHF (congestive heart failure) ([place]) 11/11/2018  . CKD (chronic kidney disease), stage III ([place]) 11/11/2018  . Hyponatremia 11/11/2018  . Hypokalemia 11/11/2018  . Lactic acid increased 11/11/2018  . Bilateral nephrolithiasis 11/11/2018    Resolved Hospital Problems  No resolved problems to display.    Discharge Condition: Stable  CODE STATUS: Full code  History of present illness:  Darius Rivers is a 58 y.o. year old male with medical history significant for nonischemic cardiomyopathy (EF 40-45%), HLD, HTN, prostate ca (2018) recent diagnosis of gastroenteritis during ED visit on 1/3 where at that time he received IV fluids and antiemeticswho presented on 11/10/2018 with persistent nausea, abdominal pain, progressive dyspnea aand was found to have profound hypotension and lactic acidosis responsive to IV fluids. Remaining hospital course addressed in problem based format below:   Hospital Course:   Elevated troponin, with ACS rule out.  Initial troponin of 1.57 and then down trended.  Cardiology was concerned for ACS  TTE showed EF of 40 to 45% (similar to baseline, TTE, 09/[facility]) diffuse hypokinesis.  Poor kidney function precluded him from left heart cath.  Instead he underwent pharmacologic stress test which showed large fixed myocardial perfusion defect but no evidence of reversibility and therefore negative for inducible myocardial ischemia.     Asymptomatic NSVT Cardiology recommended decreasing his Norvasc and increasing his beta-blocker his magnesium and potassium optimize with goals of 204 respectively.  Would recommend BMP, mag check on PCP follow-up.    AKI on CKD, Prerenal etiology from profound dehydration and loss of volume (nausea) Baseline creatinine 1.16, peak creatinine of 4.78,  significantly improved with IV fluids, 1.26 on discharge, consistent with prerenal etiology due to volume loss from gastroparesis  Hypotension, resolved Likely in the setting of recently diagnosed gastroenteritis.  Improved with supportive care and IV fluids.  Prior to discharge was able to maintain normal blood pressure with systolics in the 779T.    Gastroenteritis CT abdomen for abdominal pain showed no obstructive uropathy did show slightly thickened bladder wall but otherwise no acute abnormalities.  Resolved with supportive care  Hypertension, at goal Continue home amlodipine.  Hyperlipidemia, stable Continue home statin  Chronic CHF with reduced EF Continue Coreg (increased dose per cardiology), will resume Lasix on discharge, euvolemic on exam  Hypomagnesemia Repleted with IV.  Advised mag check with PCP follow-up..    Consultations:  Cardiology  Procedures/Studies: TTE, 11/12/2018  Study Conclusions  - Left ventricle: The cavity size was mildly dilated. There was   mild concentric hypertrophy. Systolic function was mildly to   moderately reduced. The estimated ejection fraction was in the   range of 40% to 45%. Diffuse hypokinesis. Doppler parameters  are    consistent with abnormal left ventricular relaxation (grade 1   diastolic dysfunction). - Aortic valve: There was mild to moderate regurgitation directed   centrally in the LVOT. - Pericardium, extracardiac: A trivial pericardial effusion was   identified.   Nuclear medicine perfusion scan, 11/13/2018  FINDINGS: Perfusion: A large fixed myocardial perfusion defect is seen involving the inferior wall of the left ventricle on both stress and resting images, which shows no evidence of reversibility. No reversible myocardial perfusion defects are seen.  Wall Motion: Inferior wall akinesis of left ventricle demonstrated. Moderate to severe left ventricular dilatation.  Left Ventricular Ejection Fraction: 34 %  End diastolic volume 671 ml  End systolic volume 245 ml  IMPRESSION: 1. No evidence of pharmacologic induced myocardial ischemia. Large inferior wall myocardial infarct.  2. Inferior wall akinesis of left ventricle, with moderate to severe dilatation.  3. Left ventricular ejection fraction 34%  4. Non invasive risk stratification*: High  Discharge Exam: BP (!) 147/107   Pulse 66   Temp 97.8 F (36.6 C) (Oral)   Resp 20   Ht 6\' 4"  (1.93 m)   Wt 85.2 kg   SpO2 100%   BMI 22.87 kg/m   General: Lying in bed, no apparent distress Eyes: EOMI, anicteric ENT: Oral Mucosa clear and moist Cardiovascular: regular rate and rhythm, systolic murmur, no edema, Respiratory: Normal respiratory effort, lungs clear to auscultation bilaterally Abdomen: soft, non-distended, non-tender, normal bowel sounds Skin: No Rash Neurologic: Grossly no focal neuro deficit.Mental status AAOx3, speech normal, Psychiatric:Appropriate affect, and mood   Discharge Instructions You were cared for by a hospitalist during your hospital stay. If you have any questions about your discharge medications or the care you received while you were in the hospital after you are discharged, you Darius  call the unit and asked to speak with the hospitalist on call if the hospitalist that took care of you is not available. Once you are discharged, your primary care physician will handle any further medical issues. Please note that NO REFILLS for any discharge medications will be authorized once you are discharged, as it is imperative that you return to your primary care physician (or establish a relationship with a primary care physician if you do not have one) for your aftercare needs so that they Darius reassess your need for medications and monitor your lab values.  Discharge Instructions    Diet - low sodium heart healthy   Complete by:  As directed    Increase activity slowly   Complete by:  As directed      Allergies as of 11/15/2018   No Known Allergies     Medication List    TAKE these medications   amLODipine 10 MG tablet Commonly known as:  NORVASC Take 10 mg by mouth daily.   aspirin EC 81 MG tablet Take 81 mg by mouth daily.   carvedilol 6.25 MG tablet Commonly known as:  COREG Take 3 tablets (18.75 mg total) by mouth 2 (two) times daily with a meal. What changed:    medication strength  how much to take   docusate sodium 100 MG capsule Commonly known as:  COLACE Take 1 capsule (100 mg total) by mouth 2 (two) times daily as needed for mild constipation.   furosemide 40 MG tablet Commonly known as:  LASIX Take 40 mg by mouth daily.   lisinopril 40 MG tablet Commonly known as:  PRINIVIL,ZESTRIL HOLD UNTIL SEEN BY YOUR PCP What changed:  how much to take  how to take this  when to take this  additional instructions   promethazine 25 MG tablet Commonly known as:  PHENERGAN Take 1 tablet (25 mg total) by mouth every 6 (six) hours as needed for nausea or vomiting.   rosuvastatin 40 MG tablet Commonly known as:  CRESTOR Take 1 tablet (40 mg total) by mouth daily at 6 PM. What changed:    medication strength  how much to take   Vitamin D  (Ergocalciferol) 1.25 MG (50000 UT) Caps capsule Commonly known as:  DRISDOL Take 50,000 Units by mouth every 7 (seven) days.      No Known Allergies Follow-up Information    Barrett, Evelene Croon, PA-C Follow up.   Specialties:  Cardiology, Radiology Why:  Cardiology hospital follow-up on 12/07/2018 at 8:00 AM with Dr. Lysbeth Penner PA.  Please arrive 15 minutes early for check-in. Contact information: [address] [place] [address] [phone]            The results of significant diagnostics from this hospitalization (including imaging, microbiology, ancillary and laboratory) are listed below for reference.    Significant Diagnostic Studies: Ct Abdomen Pelvis Wo Contrast  Result Date: 11/11/2018 CLINICAL DATA:  Dizziness and dyspnea x4 days. Acute renal failure. Hypertension. EXAM: CT ABDOMEN AND PELVIS WITHOUT CONTRAST TECHNIQUE: Multidetector CT imaging of the abdomen and pelvis was performed following the standard protocol without IV contrast. COMPARISON:  09/09/2016 FINDINGS: LOWER CHEST: Lung bases are clear. Included heart size is normal. No pericardial effusion. HEPATOBILIARY: Stable hypodensities scattered throughout the liver, the largest in the left hepatic lobe measuring 17 mm and on the right measuring 14 mm. These are unchanged and likely to represent small cysts or hemangiomata. No intrahepatic ductal dilatation. Physiologic distention of gallbladder without stones. PANCREAS: No inflammation or ductal dilatation. No mass given limitations of a noncontrast study. SPLEEN: Normal size spleen without mass. ADRENALS/URINARY TRACT: Kidneys are orthotopic. Bilateral nonobstructing renal calculi. No hydroureteronephrosis nor ureteral calculus. Urinary bladder is partially decompressed which may account for the slightly thickened appearance of the bladder wall. Cystitis is not entirely excluded but believed less likely. Clinical correlation is suggested. Normal adrenal glands.  STOMACH/BOWEL: Large amount retained stool is seen throughout much of the colon admixed with hyperdense contrast or bismuth containing medication. No small or large bowel obstruction. The appendix is not confidently identified no pericecal inflammation is seen. The stomach is physiologically distended with small hiatal hernia. Normal small bowel rotation is noted. VASCULAR/LYMPHATIC: Moderate aortoiliac and branch vessel atherosclerosis. No lymphadenopathy. REPRODUCTIVE: Enlarged prostate measuring up to 5.8 cm transverse. OTHER: Small umbilical hernia with herniation of a small segment of nonincarcerated small bowel, series 2/49. MUSCULOSKELETAL: Nonacute. IMPRESSION: 1. Bilateral nonobstructing renal calculi. No obstructive uropathy. 2. Slightly thickened appearance of the bladder wall which may be due to underdistention. Cystitis is not entirely excluded but believed less likely. 3. Stable hypodensities scattered throughout the liver likely to represent cysts or hemangiomata. 4. Small umbilical hernia containing a small segment of nonincarcerated small bowel. No incarceration or obstruction. Electronically Signed   By: Ashley Royalty M.D.   On: 11/11/2018 00:53   Nm Myocar Multi W/spect W/wall Motion / Ef  Result Date: 11/13/2018 CLINICAL DATA:  Congestive heart failure. Hyperlipidemia, hypertension, smoking, and family history of coronary artery disease. EXAM: MYOCARDIAL IMAGING WITH SPECT (REST AND PHARMACOLOGIC-STRESS) GATED LEFT VENTRICULAR WALL MOTION STUDY LEFT VENTRICULAR EJECTION FRACTION TECHNIQUE: Standard myocardial SPECT imaging was performed after resting intravenous  injection of 10 mCi Tc-61m tetrofosmin. Subsequently, intravenous infusion of Lexiscan was performed under the supervision of the Cardiology staff. At peak effect of the drug, 30 mCi Tc-42m tetrofosmin was injected intravenously and standard myocardial SPECT imaging was performed. Quantitative gated imaging was also performed to  evaluate left ventricular wall motion, and estimate left ventricular ejection fraction. COMPARISON:  None. FINDINGS: Perfusion: A large fixed myocardial perfusion defect is seen involving the inferior wall of the left ventricle on both stress and resting images, which shows no evidence of reversibility. No reversible myocardial perfusion defects are seen. Wall Motion: Inferior wall akinesis of left ventricle demonstrated. Moderate to severe left ventricular dilatation. Left Ventricular Ejection Fraction: 34 % End diastolic volume 919 ml End systolic volume 166 ml IMPRESSION: 1. No evidence of pharmacologic induced myocardial ischemia. Large inferior wall myocardial infarct. 2. Inferior wall akinesis of left ventricle, with moderate to severe dilatation. 3. Left ventricular ejection fraction 34% 4. Non invasive risk stratification*: High *2012 Appropriate Use Criteria for Coronary Revascularization Focused Update: J Am Coll Cardiol. 0600;45(9):977-414. http://content.airportbarriers.com.aspx?articleid=[email or web address] Electronically Signed   By: Earle Gell M.D.   On: 11/13/2018 13:10   Dg Chest Port 1 View  Result Date: 11/10/2018 CLINICAL DATA:  Dizziness and dyspnea since 11/07/2017 EXAM: PORTABLE CHEST 1 VIEW COMPARISON:  CT 09/09/2016, CXR 09/08/2016 FINDINGS: The cardiopericardial silhouette is within normal limits. Mild prominence of the ascending portion of the thoracic aorta common consistent with known ascending thoracic aortic aneurysm. Both lungs are clear. The visualized skeletal structures are unremarkable. IMPRESSION: No active disease. Stable mild prominence of the mediastinum secondary to known ascending thoracic aortic aneurysm without change. Electronically Signed   By: Ashley Royalty M.D.   On: 11/10/2018 23:14    Microbiology: Recent Results (from the past 240 hour(s))  Culture, blood (routine x 2)     Status: None   Collection Time: 11/11/18 12:45 AM  Result Value Ref Range Status   Specimen  Description   Final    BLOOD RIGHT ARM Performed at [facility], [address] [place], [place] [address]    Special Requests   Final    BOTTLES DRAWN AEROBIC AND ANAEROBIC Blood Culture results may not be optimal due to an inadequate volume of blood received in culture bottles Performed at [facility], [address] [place], [place] [address]    Culture   Final    NO GROWTH 5 DAYS Performed at [facility], [place] [address] [place], [place] [address]    Report Status 11/16/2018 FINAL  Final  Culture, blood (routine x 2)     Status: None   Collection Time: 11/11/18 12:50 AM  Result Value Ref Range Status   Specimen Description   Final    BLOOD RIGHT ARM Performed at [facility], [address] [place], [place] [address]    Special Requests   Final    BOTTLES DRAWN AEROBIC ONLY Blood Culture results may not be optimal due to an inadequate volume of blood received in culture bottles Performed at [facility], [address] [place], [place] [address]    Culture   Final    NO GROWTH 5 DAYS Performed at [facility], [place] [address], [place], [place] [address]    Report Status 11/16/2018 FINAL  Final     Labs: Basic Metabolic Panel: Recent Labs  Lab 11/10/18 2247 11/11/18 0045 11/12/18 0414 11/13/18 0626 11/14/18  0233 11/15/18 0640  NA 131*  --  136 137 135 138  K 2.9*  --  3.3* 3.5 3.5 4.2  CL 96*  --  105 106 105 110  CO2 22  --  24 22 23 23   GLUCOSE 132*  --  89 86 92 89  BUN 61*  --  38* 21* 16 11  CREATININE 4.78*  --  2.14* 1.50* 1.34* 1.26*  CALCIUM 9.2  --  8.7* 8.6* 8.2* 8.4*  MG  --  2.2  --  1.6* 1.7 1.6*   Liver Function Tests: Recent Labs  Lab 11/10/18 2247  AST 16  ALT 12  ALKPHOS 53  BILITOT 0.6  PROT 7.6  ALBUMIN 4.1   No results for input(s): LIPASE, AMYLASE in the last 168 hours. No results for input(s): AMMONIA in the last 168 hours. CBC: Recent Labs  Lab 11/10/18 2247  11/12/18 0414 11/13/18 0626  WBC 15.0* 9.6 8.4  NEUTROABS 10.8*  --  5.2  HGB 13.8 12.0* 11.1*  HCT 41.5 36.1* 32.8*  MCV 100.0 98.6 97.6  PLT 388 332 312   Cardiac Enzymes: Recent Labs  Lab 11/11/18 0045 11/11/18 0726 11/11/18 1256 11/12/18 0842 11/12/18 1438 11/12/18 2025  CKTOTAL 135  --   --   --   --   --   TROPONINI  --  0.78* 0.60* 0.35* 0.22* 0.23*   BNP: BNP (last 3 results) No results for input(s): BNP in the last 8760 hours.  ProBNP (last 3 results) No results for input(s): PROBNP in the last 8760 hours.  CBG: No results for input(s): GLUCAP in the last 168 hours.     Signed:  Desiree Hane, MD [facility] 11/17/2018, 1:40 AM

## 2018-11-15 NOTE — Discharge Instructions (Signed)
Dehydration, Adult  Dehydration is when there is not enough fluid or water in your body. This happens when you lose more fluids than you take in. Dehydration can range from mild to very bad. It should be treated right away to keep it from getting very bad. Symptoms of mild dehydration may include:  Thirst.  Dry lips.  Slightly dry mouth.  Dry, warm skin.  Dizziness. Symptoms of moderate dehydration may include:  Very dry mouth.  Muscle cramps.  Dark pee (urine). Pee may be the color of tea.  Your body making less pee.  Your eyes making fewer tears.  Heartbeat that is uneven or faster than normal (palpitations).  Headache.  Light-headedness, especially when you stand up from sitting.  Fainting (syncope). Symptoms of very bad dehydration may include:  Changes in skin, such as: ? Cold and clammy skin. ? Blotchy (mottled) or pale skin. ? Skin that does not quickly return to normal after being lightly pinched and let go (poor skin turgor).  Changes in body fluids, such as: ? Feeling very thirsty. ? Your eyes making fewer tears. ? Not sweating when body temperature is high, such as in hot weather. ? Your body making very little pee.  Changes in vital signs, such as: ? Weak pulse. ? Pulse that is more than 100 beats a minute when you are sitting still. ? Fast breathing. ? Low blood pressure.  Other changes, such as: ? Sunken eyes. ? Cold hands and feet. ? Confusion. ? Lack of energy (lethargy). ? Trouble waking up from sleep. ? Short-term weight loss. ? Unconsciousness. Follow these instructions at home:   If told by your doctor, drink an ORS: ? Make an ORS by using instructions on the package. ? Start by drinking small amounts, about  cup (120 mL) every 5-10 minutes. ? Slowly drink more until you have had the amount that your doctor said to have.  Drink enough clear fluid to keep your pee clear or pale yellow. If you were told to drink an ORS, finish the  ORS first, then start slowly drinking clear fluids. Drink fluids such as: ? Water. Do not drink only water by itself. Doing that can make the salt (sodium) level in your body get too low (hyponatremia). ? Ice chips. ? Fruit juice that you have added water to (diluted). ? Low-calorie sports drinks.  Avoid: ? Alcohol. ? Drinks that have a lot of sugar. These include high-calorie sports drinks, fruit juice that does not have water added, and soda. ? Caffeine. ? Foods that are greasy or have a lot of fat or sugar.  Take over-the-counter and prescription medicines only as told by your doctor.  Do not take salt tablets. Doing that can make the salt level in your body get too high (hypernatremia).  Eat foods that have minerals (electrolytes). Examples include bananas, oranges, potatoes, tomatoes, and spinach.  Keep all follow-up visits as told by your doctor. This is important. Contact a doctor if:  You have belly (abdominal) pain that: ? Gets worse. ? Stays in one area (localizes).  You have a rash.  You have a stiff neck.  You get angry or annoyed more easily than normal (irritability).  You are more sleepy than normal.  You have a harder time waking up than normal.  You feel: ? Weak. ? Dizzy. ? Very thirsty.  You have peed (urinated) only a small amount of very dark pee during 6-8 hours. Get help right away if:  You have   symptoms of very bad dehydration.  You cannot drink fluids without throwing up (vomiting).  Your symptoms get worse with treatment.  You have a fever.  You have a very bad headache.  You are throwing up or having watery poop (diarrhea) and it: ? Gets worse. ? Does not go away.  You have blood or something green (bile) in your throw-up.  You have blood in your poop (stool). This may cause poop to look black and tarry.  You have not peed in 6-8 hours.  You pass out (faint).  Your heart rate when you are sitting still is more than 100 beats a  minute.  You have trouble breathing. This information is not intended to replace advice given to you by your health care provider. Make sure you discuss any questions you have with your health care provider. Document Released: 08/16/2009 Document Revised: 05/09/2016 Document Reviewed: 12/14/2015 Elsevier Interactive Patient Education  2019 Elsevier Inc.  

## 2018-11-15 NOTE — Progress Notes (Addendum)
Progress Note  Patient Name: Darius Rivers Date of Encounter: 11/15/2018  Primary Cardiologist: Pixie Casino, MD   Subjective   Patient says "I feel great".  He denies any chest pain/pressure/tightness, shortness of breath, palpitations, lightheadedness.  Inpatient Medications    Scheduled Meds: . amLODipine  5 mg Oral Daily  . aspirin  81 mg Oral Daily  . carvedilol  18.75 mg Oral BID WC  . heparin  5,000 Units Subcutaneous Q8H  . regadenoson  0.4 mg Intravenous Once  . rosuvastatin  40 mg Oral q1800  . Vitamin D (Ergocalciferol)  50,000 Units Oral Weekly   Continuous Infusions: . sodium chloride 75 mL/hr at 11/15/18 0600  . magnesium sulfate 1 - 4 g bolus IVPB     PRN Meds: bisacodyl, docusate sodium, ondansetron **OR** ondansetron (ZOFRAN) IV   Vital Signs    Vitals:   11/14/18 1954 11/14/18 2001 11/15/18 0453 11/15/18 0800  BP: 131/80  133/83 (!) 152/92  Pulse: 68  70 64  Resp:  17 19 19   Temp: 98.2 F (36.8 C)  98.2 F (36.8 C)   TempSrc: Oral  Oral   SpO2: 100%  100%   Weight:   85.2 kg   Height:        Intake/Output Summary (Last 24 hours) at 11/15/2018 1000 Last data filed at 11/15/2018 0800 Gross per 24 hour  Intake 2318.38 ml  Output 1575 ml  Net 743.38 ml   Last 3 Weights 11/15/2018 11/14/2018 11/13/2018  Weight (lbs) 187 lb 14.4 oz 184 lb 8 oz 183 lb 6.4 oz  Weight (kg) 85.231 kg 83.689 kg 83.19 kg      Telemetry    Sinus rhythm in the 60s, 9 beats of NSVT overnight- Personally Reviewed  ECG    New tracings- Personally Reviewed  Physical Exam   GEN: No acute distress.   Neck: No JVD Cardiac: RRR, 1/6 systolic murmur LUSB, rubs, or gallops.  Respiratory: Clear to auscultation bilaterally. GI: Soft, nontender, non-distended  MS: No edema; No deformity. Neuro:  Nonfocal  Psych: Normal affect   Labs    Chemistry Recent Labs  Lab 11/10/18 2247  11/13/18 0626 11/14/18 0336 11/15/18 0640  NA 131*   < > 137 135 138  K 2.9*    < > 3.5 3.5 4.2  CL 96*   < > 106 105 110  CO2 22   < > 22 23 23   GLUCOSE 132*   < > 86 92 89  BUN 61*   < > 21* 16 11  CREATININE 4.78*   < > 1.50* 1.34* 1.26*  CALCIUM 9.2   < > 8.6* 8.2* 8.4*  PROT 7.6  --   --   --   --   ALBUMIN 4.1  --   --   --   --   AST 16  --   --   --   --   ALT 12  --   --   --   --   ALKPHOS 53  --   --   --   --   BILITOT 0.6  --   --   --   --   GFRNONAA 13*   < > 52* 59* >60  GFRAA 15*   < > 60* >60 >60  ANIONGAP 13   < > 9 7 5    < > = values in this interval not displayed.     Hematology Recent Labs  Lab 11/10/18 2247  11/12/18 0414 11/13/18 0626  WBC 15.0* 9.6 8.4  RBC 4.15* 3.66* 3.36*  HGB 13.8 12.0* 11.1*  HCT 41.5 36.1* 32.8*  MCV 100.0 98.6 97.6  MCH 33.3 32.8 33.0  MCHC 33.3 33.2 33.8  RDW 12.8 12.4 12.3  PLT 388 332 312    Cardiac Enzymes Recent Labs  Lab 11/11/18 1256 11/12/18 0842 11/12/18 1438 11/12/18 2025  TROPONINI 0.60* 0.35* 0.22* 0.23*   No results for input(s): TROPIPOC in the last 168 hours.   BNPNo results for input(s): BNP, PROBNP in the last 168 hours.   DDimer No results for input(s): DDIMER in the last 168 hours.   Radiology    Nm Myocar Multi W/spect W/wall Motion / Ef  Result Date: 11/13/2018 CLINICAL DATA:  Congestive heart failure. Hyperlipidemia, hypertension, smoking, and family history of coronary artery disease. EXAM: MYOCARDIAL IMAGING WITH SPECT (REST AND PHARMACOLOGIC-STRESS) GATED LEFT VENTRICULAR WALL MOTION STUDY LEFT VENTRICULAR EJECTION FRACTION TECHNIQUE: Standard myocardial SPECT imaging was performed after resting intravenous injection of 10 mCi Tc-67m tetrofosmin. Subsequently, intravenous infusion of Lexiscan was performed under the supervision of the Cardiology staff. At peak effect of the drug, 30 mCi Tc-52m tetrofosmin was injected intravenously and standard myocardial SPECT imaging was performed. Quantitative gated imaging was also performed to evaluate left ventricular wall  motion, and estimate left ventricular ejection fraction. COMPARISON:  None. FINDINGS: Perfusion: A large fixed myocardial perfusion defect is seen involving the inferior wall of the left ventricle on both stress and resting images, which shows no evidence of reversibility. No reversible myocardial perfusion defects are seen. Wall Motion: Inferior wall akinesis of left ventricle demonstrated. Moderate to severe left ventricular dilatation. Left Ventricular Ejection Fraction: 34 % End diastolic volume 335 ml End systolic volume 456 ml IMPRESSION: 1. No evidence of pharmacologic induced myocardial ischemia. Large inferior wall myocardial infarct. 2. Inferior wall akinesis of left ventricle, with moderate to severe dilatation. 3. Left ventricular ejection fraction 34% 4. Non invasive risk stratification*: High *2012 Appropriate Use Criteria for Coronary Revascularization Focused Update: J Am Coll Cardiol. 2563;89(3):734-287. http://content.airportbarriers.com.aspx?articleid=[email or web address] Electronically Signed   By: Earle Gell M.D.   On: 11/13/2018 13:10    Cardiac Studies   ECHO:11/12/2018 - Left ventricle: The cavity size was mildly dilated. There was mild concentric hypertrophy. Systolic function was mildly to moderately reduced. The estimated ejection fraction was in the range of 40% to 45%. Diffuse hypokinesis. Doppler parameters are consistent with abnormal left ventricular relaxation (grade 1 diastolic dysfunction). - Aortic valve: There was mild to moderate regurgitation directed centrally in the LVOT. - Pericardium, extracardiac: A trivial pericardial effusion was identified. Echocardiogram showed EF 40-45% with diffuse hypokinesis and grade 1 diastolic dysfunction  Lexiscan Myoview 11/13/2018 IMPRESSION: 1. No evidence of pharmacologic induced myocardial ischemia.  Give another dose of potassium today.   Give magnesium today.  2. Inferior wall akinesis of left ventricle, with  moderate to severe dilatation. 3. Left ventricular ejection fraction 34% 4. Non invasive risk stratification*: High  Patient Profile     58 y.o. male w/ hx NICM, chronic systolic HF, nonobstructive CAD, VT, ascending aortic aneurysm , HTN, LVH, HLD, CKD, h/o prostate cancer and nephrolithiasis, was admitted 01/09 w/ AKI and ?NSTEMI. He had recent N/V/D, gastroenteritis with dehydration.   Assessment & Plan    Elevated troponin -Max troponin 0.78, trended down -Lexiscan Myoview is negative for inducible ischemia. -May have been demand ischemia in the setting of hypotension and decreased perfusion with recent gastroenteritis and dehydration -No indication for  cardiac cath at this time  Cardiomyopathy -Echo showed EF 40-45% with diffuse hypokinesis and grade 1 diastolic dysfunction, mild-moderate aortic regurgitation -Patient appears euvolemic.  Is still receiving IV fluids.  Renal function improving -Serum creatinine 2.14 on 11/12/2018, trending down to 1.26 today -Possibly related to decreased perfusion in the setting of hypotension -Still on IV fluids.  Electrolyte abnormality -Magnesium 1.6.  Magnesium 4 g IV has been ordered  Hypertension -Patient was hypotensive on admission, improved with IV fluids -On amlodipine 10 mg, reduced to 5 mg yesterday, carvedilol 12.5 mg increased to 18.75 mg. -Blood pressure was well controlled yesterday, mildly elevated today.  Monitor on his morning medications.  Hyperlipidemia -On rosuvastatin, increased to 40 mg daily -No recent lipid panel in epic  NSVT -Beta-blocker has been increased.  Patient had one episode overnight, appears to be less frequent -Maintain potassium around 4 and magnesium >2  For questions or updates, please contact [place] Please consult [email or web address] for contact info under      Signed, Daune Perch, NP  11/15/2018, 10:00 AM    I have personally seen and examined this patient. I agree with the  assessment and plan as outlined above.  He has no chest pain. Renal function improved. Stress test without ischemia. ? Elevated troponin due to demand ischemia given recent acute GI illness. Continue beta blocker for NSVT. OK for d/c home from cardiology perspective. His follow up will be with Dr. Debara Pickett.   Lauree Chandler 11/15/2018 10:48 AM

## 2018-11-16 LAB — CULTURE, BLOOD (ROUTINE X 2)
Culture: NO GROWTH
Culture: NO GROWTH

## 2018-12-07 ENCOUNTER — Ambulatory Visit (INDEPENDENT_AMBULATORY_CARE_PROVIDER_SITE_OTHER): Payer: BLUE CROSS/BLUE SHIELD | Admitting: Physician Assistant

## 2018-12-07 ENCOUNTER — Encounter: Payer: Self-pay | Admitting: Physician Assistant

## 2018-12-07 VITALS — BP 148/90 | HR 67 | Ht 76.5 in | Wt 194.0 lb

## 2018-12-07 DIAGNOSIS — I712 Thoracic aortic aneurysm, without rupture, unspecified: Secondary | ICD-10-CM

## 2018-12-07 DIAGNOSIS — I4729 Other ventricular tachycardia: Secondary | ICD-10-CM

## 2018-12-07 DIAGNOSIS — E785 Hyperlipidemia, unspecified: Secondary | ICD-10-CM

## 2018-12-07 DIAGNOSIS — I5022 Chronic systolic (congestive) heart failure: Secondary | ICD-10-CM | POA: Diagnosis not present

## 2018-12-07 DIAGNOSIS — R778 Other specified abnormalities of plasma proteins: Secondary | ICD-10-CM

## 2018-12-07 DIAGNOSIS — I472 Ventricular tachycardia: Secondary | ICD-10-CM

## 2018-12-07 DIAGNOSIS — R7989 Other specified abnormal findings of blood chemistry: Secondary | ICD-10-CM

## 2018-12-07 DIAGNOSIS — I255 Ischemic cardiomyopathy: Secondary | ICD-10-CM

## 2018-12-07 DIAGNOSIS — I10 Essential (primary) hypertension: Secondary | ICD-10-CM

## 2018-12-07 LAB — LIPID PANEL
CHOL/HDL RATIO: 2.7 ratio (ref 0.0–5.0)
Cholesterol, Total: 117 mg/dL (ref 100–199)
HDL: 44 mg/dL (ref >39)
LDL CALC: 64 mg/dL (ref 0–99)
Triglycerides: 43 mg/dL (ref 0–149)
VLDL Cholesterol Cal: 9 mg/dL (ref 5–40)

## 2018-12-07 LAB — BASIC METABOLIC PANEL
BUN / CREAT RATIO: 14 (ref 9–20)
BUN: 15 mg/dL (ref 6–24)
CHLORIDE: 105 mmol/L (ref 96–106)
CO2: 21 mmol/L (ref 20–29)
Calcium: 9.8 mg/dL (ref 8.7–10.2)
Creatinine, Ser: 1.09 mg/dL (ref 0.76–1.27)
GFR calc Af Amer: 88 mL/min/{1.73_m2} (ref >59)
GFR calc non Af Amer: 76 mL/min/{1.73_m2} (ref >59)
GLUCOSE: 95 mg/dL (ref 65–99)
Potassium: 4.1 mmol/L (ref 3.5–5.2)
Sodium: 139 mmol/L (ref 134–144)

## 2018-12-07 LAB — MAGNESIUM: Magnesium: 1.9 mg/dL (ref 1.6–2.3)

## 2018-12-07 MED ORDER — CARVEDILOL 6.25 MG PO TABS: 18.7500 mg | ORAL_TABLET | Freq: Two times a day (BID) | ORAL | 5 refills | Status: DC

## 2018-12-07 MED ORDER — ISOSORBIDE MONONITRATE ER 30 MG PO TB24: 30.0000 mg | ORAL_TABLET | Freq: Every day | ORAL | 1 refills | Status: DC

## 2018-12-07 MED ORDER — ROSUVASTATIN CALCIUM 40 MG PO TABS: 40.0000 mg | ORAL_TABLET | Freq: Every day | ORAL | 1 refills | Status: DC

## 2018-12-07 NOTE — Patient Instructions (Signed)
Medication Instructions:  Your physician has recommended you make the following change in your medication:  1) START Imdur 30mg  (1/2 tablet) daily for 3 days. If tolerated increase to 1 tablet 30mg  daily.  Rosuvastatin and Carvedilol has been refilled today.  If you need a refill on your cardiac medications before your next appointment, please call your pharmacy.   Lab work: Art gallery manager, Lipid, Mag today If you have labs (blood work) drawn today and your tests are completely normal, you will receive your results only by: Marland Kitchen MyChart Message (if you have MyChart) OR . A paper copy in the mail If you have any lab test that is abnormal or we need to change your treatment, we will call you to review the results.  Testing/Procedures: None ordered  Follow-Up: At [facility], you and your health needs are our priority.  As part of our continuing mission to provide you with exceptional heart care, we have created designated Provider Care Teams.  These Care Teams include your primary Cardiologist (physician) and Advanced Practice Providers (APPs -  Physician Assistants and Nurse Practitioners) who all work together to provide you with the care you need, when you need it. You will need a follow up appointment in 3 months with Dr.Hilty.    You may see Pixie Casino, MD or one of the following Advanced Practice Providers on your designated Care Team: [place], [place] . Fabian Sharp, PA-C  Any Other Special Instructions Will Be Listed Below (If Applicable). Your blood pressure goal is 130/80  Please call the office if you decide to proceed with the cardiac cath

## 2018-12-07 NOTE — Progress Notes (Signed)
Cardiology Office Note   Date:  12/07/2018   ID:  Darius Rivers, DOB 06-26-1961, MRN [id number]  PCP:  Fortino Sic, PA Cardiologist:  Pixie Casino, MD 11/12/2018 Darius Ferries, PA-C     History of Present Illness: Darius Rivers is a 58 y.o. male with a history of NSVT (seen in-hospital), Asc Aortic aneurysm, CKD II, HTN, HLD, prostate CA, S-CHF 2017 at [facility] w/ non-obs dz at cath, nephrolithiasis  Admitted 01/08-01/15/2020 for N&V, abd pain (recent gastroenteritis), hypotension and lactic acidosis. Elevated troponin>>peak 1.57>>echo w/ EF 40-45%>>MV w/ scar but no ischemia, no cath 2nd acute on chronic CKD (peak Cr 4.78), meds adjusted for NSVT, ck BMET, Mg at f/u, Cr at d/c 1.26  [name] presents for cardiology follow-up  He never gets chest pain.    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  He has not had chest pain or shortness of breath with exertion.  He does not feel limited in any way by his breathing.  He denies lower extremity edema, orthopnea or PND.  He is aware that if he wakes up with fluid in his legs, and is short of breath that he needs medication.  He never has palpitations, no presyncope or syncope.  He has been compliant with his medications, but needs refills on his cardiac drugs.  No weakness, no fatigue.  When I discussed the Myoview results with he and his family, he was not aware of any time in the past when he might of had a heart attack.  He has not had any recent testing on his aneurysm.   Past Medical History:  Diagnosis Date  . Ascending aortic aneurysm ([place])   . CKD (chronic kidney disease), stage II   . Hyperlipidemia   . Hypertension   . Nephrolithiasis   . Prostate cancer ([place])   . Systolic CHF [facility])     Past Surgical History:  Procedure Laterality Date  . CYSTOSCOPY WITH URETEROSCOPY, STONE BASKETRY AND STENT PLACEMENT     "put a stent  in; pulled stent out later"  . INGUINAL HERNIA REPAIR Bilateral 1980s  . PROSTATE BIOPSY      Current Outpatient Medications  Medication Sig Dispense Refill  . amLODipine (NORVASC) 10 MG tablet Take 10 mg by mouth daily.     Marland Kitchen aspirin EC 81 MG tablet Take 81 mg by mouth daily.    . carvedilol (COREG) 6.25 MG tablet Take 3 tablets (18.75 mg total) by mouth 2 (two) times daily with a meal. 60 tablet 0  . docusate sodium (COLACE) 100 MG capsule Take 1 capsule (100 mg total) by mouth 2 (two) times daily as needed for mild constipation. 10 capsule 0  . furosemide (LASIX) 40 MG tablet Take 40 mg by mouth daily.     Marland Kitchen lisinopril (PRINIVIL,ZESTRIL) 40 MG tablet HOLD UNTIL SEEN BY YOUR PCP    . promethazine (PHENERGAN) 25 MG tablet Take 1 tablet (25 mg total) by mouth every 6 (six) hours as needed for nausea or vomiting. 10 tablet 0  . rosuvastatin (CRESTOR) 40 MG tablet Take 1 tablet (40 mg total) by mouth daily at 6 PM. 45 tablet 0  . Vitamin D, Ergocalciferol, (DRISDOL) 1.25 MG (50000 UT) CAPS capsule Take 50,000 Units by mouth every 7 (seven) days.      No current facility-administered medications for this visit.     Allergies:   Patient has  no known allergies.    Social History:  The patient  reports that he quit smoking about 20 years ago. His smoking use included cigarettes. He has a 6.60 pack-year smoking history. He has never used smokeless tobacco. He reports current alcohol use. He reports previous drug use. Drug: Marijuana.   Family History:  The patient's family history includes High blood pressure in his mother.  He indicated that the status of his mother is unknown. He indicated that the status of his neg hx is unknown.   ROS:  Please see the history of present illness. All other systems are reviewed and negative.    PHYSICAL EXAM: VS:  BP (!) 148/90   Pulse 67   Ht 6' 4.5" (1.943 m)   Wt 194 lb (88 kg)   SpO2 98%   BMI 23.31 kg/m  , BMI Body mass index is 23.31  kg/m. GEN: Well nourished, well developed, slender male in no acute distress HEENT: normal for age  Neck: no JVD, no carotid bruit, no masses Cardiac: RRR; 2/6 murmur, no rubs, or gallops Respiratory:  clear to auscultation bilaterally, normal work of breathing GI: soft, nontender, nondistended, + BS MS: no deformity or atrophy; no edema; distal pulses are 2+ in all 4 extremities  Skin: warm and dry, no rash Neuro:  Strength and sensation are intact Psych: euthymic mood, full affect   EKG:  EKG is ordered today. The ekg ordered today demonstrates sinus rhythm, heart rate 67, LVH and repull abnormalities, inferolateral T wave inversion seen on 11/10/2018 ECG have improved  ECHO:11/12/2018 - Left ventricle: The cavity size was mildly dilated. There was mild concentric hypertrophy. Systolic function was mildly to moderately reduced. The estimated ejection fraction was in the range of 40% to 45%. Diffuse hypokinesis. Doppler parameters are consistent with abnormal left ventricular relaxation (grade 1 diastolic dysfunction). - Aortic valve: There was mild to moderate regurgitation directed centrally in the LVOT. - Pericardium, extracardiac: A trivial pericardial effusion was identified. Echocardiogram showed EF 40-45% with diffuse hypokinesis and grade 1 diastolic dysfunction  Lexiscan Myoview 11/13/2018 IMPRESSION: 1. No evidence of pharmacologic induced myocardial ischemia.Large inferior wall myocardial infarct. 2. Inferior wall akinesis of left ventricle, with moderate to severe dilatation. 3. Left ventricular ejection fraction 34% 4. Non invasive risk stratification*: High   Recent Labs: 11/10/2018: ALT 12 11/13/2018: Hemoglobin 11.1; Platelets 312 11/15/2018: BUN 11; Creatinine, Ser 1.26; Magnesium 1.6; Potassium 4.2; Sodium 138  CBC    Component Value Date/Time   WBC 8.4 11/13/2018 0626   RBC 3.36 (L) 11/13/2018 0626   HGB 11.1 (L) 11/13/2018 0626   HCT  32.8 (L) 11/13/2018 0626   PLT 312 11/13/2018 0626   MCV 97.6 11/13/2018 0626   MCH 33.0 11/13/2018 0626   MCHC 33.8 11/13/2018 0626   RDW 12.3 11/13/2018 0626   LYMPHSABS 2.2 11/13/2018 0626   MONOABS 0.8 11/13/2018 0626   EOSABS 0.2 11/13/2018 0626   BASOSABS 0.0 11/13/2018 0626   CMP Latest Ref Rng & Units 11/15/2018 11/14/2018 11/13/2018  Glucose 70 - 99 mg/dL 89 92 86  BUN 6 - 20 mg/dL 11 16 21(H)  Creatinine 0.61 - 1.24 mg/dL 1.26(H) 1.34(H) 1.50(H)  Sodium 135 - 145 mmol/L 138 135 137  Potassium 3.5 - 5.1 mmol/L 4.2 3.5 3.5  Chloride 98 - 111 mmol/L 110 105 106  CO2 22 - 32 mmol/L 23 23 22   Calcium 8.9 - 10.3 mg/dL 8.4(L) 8.2(L) 8.6(L)  Total Protein 6.5 - 8.1 g/dL - - -  Total Bilirubin 0.3 - 1.2 mg/dL - - -  Alkaline Phos 38 - 126 U/L - - -  AST 15 - 41 U/L - - -  ALT 0 - 44 U/L - - -     Lipid Panel No results found for: CHOL, HDL, LDLCALC, LDLDIRECT, TRIG, CHOLHDL    Wt Readings from Last 3 Encounters:  12/07/18 194 lb (88 kg)  11/15/18 187 lb 14.4 oz (85.2 kg)  11/05/18 184 lb (83.5 kg)     Other studies Reviewed: Additional studies/ records that were reviewed today include: Care Everywhere records, hospital records and testing.  ASSESSMENT AND PLAN:  1.  Ischemic cardiomyopathy, abnormal Myoview w/ large scar: He is on aspirin, beta-blocker, ACE inhibitor and statin.  Add Imdur  2.  Elevated troponin, demand ischemia versus non-STEMI: At this time, he is not having any ischemic symptoms and it is possible that the troponin elevation was all demand ischemia. -However, with the large scar on his Myoview, it is possible he has had a heart attack at some point. - I discussed the options with the patient and his family. - The risks and benefits of a cardiac catheterization including, but not limited to, death, stroke, MI, kidney damage and bleeding were discussed with the patient who indicates understanding, but prefers to go with medical therapy at this time. -  He understands that if he develops any concerning symptoms, even atypical ones such as fatigue or shortness of breath, he needs to contact us.  We can always do the heart catheterization in the future if he wishes. -For now, continue medical therapy for presumed coronary artery disease. -He wants to go back to exercising and he was advised that this is okay, but he needs to limit his activity and work up gradually to what ever he was doing before.  He is in agreement with this.  3.  Hypertension: He needs to check his blood pressure as an outpatient, the goal is 130/80.  His blood pressure may improve slightly with the Imdur.  Additionally, he just took his medications and they may not have had full effect yet. -Add Imdur, but no other med changes for now. -  management per his PCP  4.  Ascending aortic aneurysm: Dr. Fletcher Anon was able to review the images and the ascending aorta was 4.5 cm in one view and 4.6 cm and another view.  This is slightly enlarged from 2017.  He recommends a recheck in 1 year, will get a CT at that time as long as his renal function permits.  5.  Chronic systolic CHF: Although his weight is up from discharge, he was likely down due to his illness.  He has no symptoms of volume overload and no signs of overload on exam.  6.  Nonsustained VT: He is having no palpitations, is not lightheaded or dizzy.  Continue beta-blocker.  7.  Hyperlipidemia: Patient has no lipid profile in the system.  He cannot remember when his physician checked it last.  Continue high-dose statin and check profile today. -Goal LDL is less than 70 -Liver functions were normal during his recent hospitalization.   Current medicines are reviewed at length with the patient today.  The patient has concerns regarding medicines.  Concerns were addressed  The following changes have been made: Add Imdur and refill Coreg and Crestor  Labs/ tests ordered today include:   Orders Placed This Encounter   Procedures  . CT Angio Chest/Abd/Pel for Dissection W and/or W/WO  .  Lipid panel  . Basic metabolic panel  . Magnesium  . EKG 12-Lead    Disposition:   FU with Pixie Casino, MD  Signed, Darius Ferries, PA-C  12/07/2018 9:06 AM    [facility] Phone: [phone]; Fax: [phone]

## 2019-03-04 ENCOUNTER — Telehealth: Payer: Self-pay

## 2019-03-04 NOTE — Telephone Encounter (Signed)
Called patient to discuss upcoming appointment on 03/07/19 at 8:45a.   [email or web address]

## 2019-03-04 NOTE — Telephone Encounter (Signed)
Attempted to contact pt x 2 to change appointment to virtual visit. Left message to call back.  

## 2019-03-07 ENCOUNTER — Ambulatory Visit: Payer: BLUE CROSS/BLUE SHIELD | Admitting: Internal Medicine

## 2019-03-09 ENCOUNTER — Telehealth: Payer: Self-pay | Admitting: Internal Medicine

## 2019-03-10 ENCOUNTER — Telehealth (INDEPENDENT_AMBULATORY_CARE_PROVIDER_SITE_OTHER): Payer: BLUE CROSS/BLUE SHIELD | Admitting: Internal Medicine

## 2019-03-10 VITALS — Ht 76.0 in | Wt 185.0 lb

## 2019-03-10 DIAGNOSIS — N179 Acute kidney failure, unspecified: Secondary | ICD-10-CM | POA: Diagnosis not present

## 2019-03-10 DIAGNOSIS — N183 Chronic kidney disease, stage 3 unspecified: Secondary | ICD-10-CM

## 2019-03-10 DIAGNOSIS — I712 Thoracic aortic aneurysm, without rupture, unspecified: Secondary | ICD-10-CM

## 2019-03-10 DIAGNOSIS — I10 Essential (primary) hypertension: Secondary | ICD-10-CM

## 2019-03-10 DIAGNOSIS — E785 Hyperlipidemia, unspecified: Secondary | ICD-10-CM | POA: Diagnosis not present

## 2019-03-10 DIAGNOSIS — I472 Ventricular tachycardia: Secondary | ICD-10-CM

## 2019-03-10 DIAGNOSIS — I5022 Chronic systolic (congestive) heart failure: Secondary | ICD-10-CM | POA: Diagnosis not present

## 2019-03-10 DIAGNOSIS — I4729 Other ventricular tachycardia: Secondary | ICD-10-CM

## 2019-03-10 NOTE — Progress Notes (Signed)
Virtual Visit via Telephone Note   This visit type was conducted due to national recommendations for restrictions regarding the COVID-19 Pandemic (e.g. social distancing) in an effort to limit this patient's exposure and mitigate transmission in our community.  Due to his co-morbid illnesses, this patient is at least at moderate risk for complications without adequate follow up.  This format is felt to be most appropriate for this patient at this time.  The patient did not have access to video technology/had technical difficulties with video requiring transitioning to audio format only (telephone).  All issues noted in this document were discussed and addressed.  No physical exam could be performed with this format.  Please refer to the patient's chart for his  consent to telehealth for [name].   Evaluation Performed:  Telephone visit  Date:  03/10/2019   ID:  Darius Rivers, DOB 1960-11-05, MRN [id number]  Patient Location:  [place] [address]  Provider location:   [address] [place], [place] [address]  PCP:  Fortino Sic, [place]  Cardiologist:  Pixie Casino, MD Electrophysiologist:  None   Chief Complaint:  No complaints  History of Present Illness:    Darius Rivers is a 58 y.o. male who presents via audio/video conferencing for a telehealth visit today.  Darius Rivers was seen today via telephone visit.  He seems to be doing well denies any chest pain or worsening shortness of breath.  He was hospitalized with an STEMI in January and an echo showed an EF of 40 to 45%.  He also had a gastroenteritis syndrome.  Stress testing was performed which showed a large fixed defect with possible peri-infarct ischemia.  Given his chronic kidney disease, catheterization was not performed.  Adequate therapy was recommended.  He did see Rosaria Ferries, PA-C, and follow-up in February who started him on Imdur.  He says is tolerated it well without  headaches.  He has no chest pain symptoms.  Unfortunately does not have his blood pressure cuff available at the moment for [place] to know whether his blood pressure has improved.  He said he would try to locate it and try to get [place] a blood pressure later today.  Work from February showed total cholesterol 117, triglycerides 43, HDL 44 and LDL 64.  The patient does not have symptoms concerning for COVID-19 infection (fever, chills, cough, or new SHORTNESS OF BREATH).    Prior CV studies:   The following studies were reviewed today:  Lab work Hospital records  PMHx:  Past Medical History:  Diagnosis Date   Ascending aortic aneurysm ([place])    CKD (chronic kidney disease), stage II    Hyperlipidemia    Hypertension    Nephrolithiasis    Prostate cancer ([place])    Systolic CHF ([place])     Past Surgical History:  Procedure Laterality Date   CYSTOSCOPY WITH URETEROSCOPY, STONE BASKETRY AND STENT PLACEMENT     "put a stent in; pulled stent out later"   INGUINAL HERNIA REPAIR Bilateral 1980s   PROSTATE BIOPSY      FAMHx:  Family History  Problem Relation Age of Onset   High blood pressure Mother    Heart attack Neg Hx    Cancer Neg Hx    Diabetes Neg Hx     SOCHx:   reports that he quit smoking about 20 years ago. His smoking use included cigarettes. He has a 6.60 pack-year smoking history. He has never used  smokeless tobacco. He reports current alcohol use. He reports previous drug use. Drug: Marijuana.  ALLERGIES:  No Known Allergies  MEDS:  Current Meds  Medication Sig   amLODipine (NORVASC) 10 MG tablet Take 10 mg by mouth daily.    aspirin EC 81 MG tablet Take 81 mg by mouth daily.   carvedilol (COREG) 6.25 MG tablet Take 3 tablets (18.75 mg total) by mouth 2 (two) times daily with a meal.   docusate sodium (COLACE) 100 MG capsule Take 1 capsule (100 mg total) by mouth 2 (two) times daily as needed for mild constipation.   furosemide (LASIX) 40 MG tablet  Take 40 mg by mouth daily.    isosorbide mononitrate (IMDUR) 30 MG 24 hr tablet Take 1 tablet (30 mg total) by mouth daily.   lisinopril (PRINIVIL,ZESTRIL) 40 MG tablet HOLD UNTIL SEEN BY YOUR PCP   promethazine (PHENERGAN) 25 MG tablet Take 1 tablet (25 mg total) by mouth every 6 (six) hours as needed for nausea or vomiting.   rosuvastatin (CRESTOR) 40 MG tablet Take 1 tablet (40 mg total) by mouth daily at 6 PM.   Vitamin D, Ergocalciferol, (DRISDOL) 1.25 MG (50000 UT) CAPS capsule Take 50,000 Units by mouth every 7 (seven) days.      ROS: Pertinent items noted in HPI and remainder of comprehensive ROS otherwise negative.  Labs/Other Tests and Data Reviewed:    Recent Labs: 11/10/2018: ALT 12 11/13/2018: Hemoglobin 11.1; Platelets 312 12/07/2018: BUN 15; Creatinine, Ser 1.09; Magnesium 1.9; Potassium 4.1; Sodium 139   Recent Lipid Panel Lab Results  Component Value Date/Time   CHOL 117 12/07/2018 08:58 AM   TRIG 43 12/07/2018 08:58 AM   HDL 44 12/07/2018 08:58 AM   CHOLHDL 2.7 12/07/2018 08:58 AM   LDLCALC 64 12/07/2018 08:58 AM    Wt Readings from Last 3 Encounters:  03/10/19 185 lb (83.9 kg)  12/07/18 194 lb (88 kg)  11/15/18 187 lb 14.4 oz (85.2 kg)     Exam:    Vital Signs:  Ht 6\' 4"  (1.93 m)    Wt 185 lb (83.9 kg)    BMI 22.52 kg/m    Exam not performed due to telephone visit  ASSESSMENT & PLAN:    1. Ischemic cardiomyopathy with large scar on Myoview stress test, LVEF 40 to 45% 2. CKD 2/3 3. Hyperlipidemia 4. Hypertension 5. Recent and STEMI versus demand ischemia (11/2018) 6. History of NSVT 7. Ascending aortic aneurysm measuring 4.5 to 4.6 cm  Darius Rivers seems to be doing well.  He is medically managed for presumed ischemic cardiomyopathy.  He did not undergo cardiac catheterization due to acute on chronic kidney disease.  He is medically managed and has had no chest pain.  He was started on Imdur.  Unfortunately we do not have a blood pressure today  however he work on obtaining that.  He was also noted to have an ascending aortic aneurysm measuring 4.5 to 4.6 cm.  He will need a reevaluation of that likely in January 2021 via CT angiogram provided his creatinine can tolerate that.  COVID-19 Education: The signs and symptoms of COVID-19 were discussed with the patient and how to seek care for testing (follow up with PCP or arrange E-visit).  The importance of social distancing was discussed today.  Patient Risk:   After full review of this patients clinical status, I feel that they are at least moderate risk at this time.  Time:   Today, I have spent 25  minutes with the patient with telehealth technology discussing chronic systolic heart failure, ascending aortic aneurysm, nonsustained VT, coronary artery disease, hypertension and dyslipidemia.     Medication Adjustments/Labs and Tests Ordered: Current medicines are reviewed at length with the patient today.  Concerns regarding medicines are outlined above.   Tests Ordered: No orders of the defined types were placed in this encounter.   Medication Changes: No orders of the defined types were placed in this encounter.   Disposition:  in 6 month(s)  Pixie Casino, MD, [facility], [place] Director of the Advanced Lipid Disorders &  Cardiovascular Risk Reduction Clinic Diplomate of the American Board of Clinical Lipidology Attending Cardiologist  Direct Dial: [phone]   Fax: [phone]  Website:  [email or web address]  Pixie Casino, MD  03/10/2019 8:52 AM

## 2019-03-10 NOTE — Patient Instructions (Signed)
Medication Instructions:  Continue current medications If you need a refill on your cardiac medications before your next appointment, please call your pharmacy.   Lab work: None needed If you have labs (blood work) drawn today and your tests are completely normal, you will receive your results only by: Marland Kitchen MyChart Message (if you have MyChart) OR . A paper copy in the mail If you have any lab test that is abnormal or we need to change your treatment, we will call you to review the results.  Testing/Procedures: None needed   Follow-Up: At [facility], you and your health needs are our priority.  As part of our continuing mission to provide you with exceptional heart care, we have created designated Provider Care Teams.  These Care Teams include your primary Cardiologist (physician) and Advanced Practice Providers (APPs -  Physician Assistants and Nurse Practitioners) who all work together to provide you with the care you need, when you need it. You will need a follow up appointment in 6 months.  Please call our office 2 months in advance to schedule this appointment.  You may see Pixie Casino, MD or one of the following Advanced Practice Providers on your designated Care Team: [employer], [place] . Fabian Sharp, PA-C  Any Other Special Instructions Will Be Listed Below (If Applicable).  Please call our office with your BP readings

## 2019-05-03 NOTE — Telephone Encounter (Signed)
Open n error °

## 2019-06-02 ENCOUNTER — Other Ambulatory Visit: Payer: Self-pay | Admitting: Physician Assistant

## 2019-06-08 DIAGNOSIS — E785 Hyperlipidemia, unspecified: Secondary | ICD-10-CM | POA: Diagnosis not present

## 2019-06-08 DIAGNOSIS — R7303 Prediabetes: Secondary | ICD-10-CM | POA: Diagnosis not present

## 2019-06-11 ENCOUNTER — Other Ambulatory Visit: Payer: Self-pay | Admitting: Physician Assistant

## 2019-06-17 DIAGNOSIS — E876 Hypokalemia: Secondary | ICD-10-CM | POA: Diagnosis not present

## 2019-06-17 DIAGNOSIS — I10 Essential (primary) hypertension: Secondary | ICD-10-CM | POA: Diagnosis not present

## 2019-12-06 DIAGNOSIS — E559 Vitamin D deficiency, unspecified: Secondary | ICD-10-CM | POA: Diagnosis not present

## 2019-12-06 DIAGNOSIS — R7303 Prediabetes: Secondary | ICD-10-CM | POA: Diagnosis not present

## 2019-12-06 DIAGNOSIS — R5383 Other fatigue: Secondary | ICD-10-CM | POA: Diagnosis not present

## 2019-12-06 DIAGNOSIS — E785 Hyperlipidemia, unspecified: Secondary | ICD-10-CM | POA: Diagnosis not present

## 2019-12-06 DIAGNOSIS — E0590 Thyrotoxicosis, unspecified without thyrotoxic crisis or storm: Secondary | ICD-10-CM | POA: Diagnosis not present

## 2019-12-06 DIAGNOSIS — E876 Hypokalemia: Secondary | ICD-10-CM | POA: Diagnosis not present

## 2019-12-13 DIAGNOSIS — R718 Other abnormality of red blood cells: Secondary | ICD-10-CM | POA: Diagnosis not present

## 2019-12-13 DIAGNOSIS — N182 Chronic kidney disease, stage 2 (mild): Secondary | ICD-10-CM | POA: Diagnosis not present

## 2019-12-13 DIAGNOSIS — E782 Mixed hyperlipidemia: Secondary | ICD-10-CM | POA: Diagnosis not present

## 2019-12-13 DIAGNOSIS — I129 Hypertensive chronic kidney disease with stage 1 through stage 4 chronic kidney disease, or unspecified chronic kidney disease: Secondary | ICD-10-CM | POA: Diagnosis not present

## 2019-12-13 DIAGNOSIS — Z87891 Personal history of nicotine dependence: Secondary | ICD-10-CM | POA: Diagnosis not present

## 2019-12-13 DIAGNOSIS — D649 Anemia, unspecified: Secondary | ICD-10-CM | POA: Diagnosis not present

## 2019-12-23 ENCOUNTER — Telehealth: Payer: Self-pay | Admitting: Physician Assistant

## 2019-12-23 NOTE — Telephone Encounter (Signed)
Left message for patient to call and schedule CTA chest/abd/pelvis ordered by Rosaria Ferries, PA

## 2019-12-27 NOTE — Telephone Encounter (Signed)
Left message for patient to call and scheduled CTA chest abdomen and pelvis ordered by Rosaria Ferries, PA

## 2019-12-29 NOTE — Telephone Encounter (Signed)
Left message for patient to call and schedule CTA chest/abd/pelvis ordered by Rosaria Ferries, PA  Pt also needs to have lab work prior to CT

## 2020-01-05 ENCOUNTER — Encounter: Payer: Self-pay | Admitting: Cardiovascular Disease

## 2020-01-05 NOTE — Telephone Encounter (Deleted)
Left messages on 12/05/19--12/27/19--patient has not responded---will lmail letter

## 2020-01-05 NOTE — Telephone Encounter (Signed)
Left message 12/14/19--12/23/19 and  12/27/19 for patient to call and schedule  CTA chest ordered by Dr. Jobe Marker mail letter to patient.

## 2020-09-13 ENCOUNTER — Encounter: Payer: Self-pay | Admitting: General Practice

## 2020-09-13 ENCOUNTER — Telehealth: Payer: Self-pay | Admitting: Internal Medicine

## 2020-09-13 NOTE — Telephone Encounter (Signed)
  Recall expunge letter sent for overdue 6 mo f/u 

## 2020-12-03 DIAGNOSIS — I5022 Chronic systolic (congestive) heart failure: Secondary | ICD-10-CM | POA: Diagnosis not present

## 2020-12-03 DIAGNOSIS — E876 Hypokalemia: Secondary | ICD-10-CM | POA: Diagnosis not present

## 2020-12-03 DIAGNOSIS — I110 Hypertensive heart disease with heart failure: Secondary | ICD-10-CM | POA: Diagnosis not present

## 2020-12-03 DIAGNOSIS — I10 Essential (primary) hypertension: Secondary | ICD-10-CM | POA: Diagnosis not present

## 2020-12-03 DIAGNOSIS — Z87891 Personal history of nicotine dependence: Secondary | ICD-10-CM | POA: Diagnosis not present

## 2020-12-03 DIAGNOSIS — E782 Mixed hyperlipidemia: Secondary | ICD-10-CM | POA: Diagnosis not present

## 2021-01-14 DIAGNOSIS — E782 Mixed hyperlipidemia: Secondary | ICD-10-CM | POA: Diagnosis not present

## 2021-01-14 DIAGNOSIS — I10 Essential (primary) hypertension: Secondary | ICD-10-CM | POA: Diagnosis not present

## 2021-02-03 ENCOUNTER — Inpatient Hospital Stay (HOSPITAL_BASED_OUTPATIENT_CLINIC_OR_DEPARTMENT_OTHER)
Admission: EM | Admit: 2021-02-03 | Discharge: 2021-02-07 | DRG: 682 | Disposition: A | Discharge status: Home / Self Care | Payer: BC Managed Care – PPO | Attending: Internal Medicine | Admitting: Internal Medicine

## 2021-02-03 ENCOUNTER — Other Ambulatory Visit: Payer: Self-pay

## 2021-02-03 ENCOUNTER — Encounter (HOSPITAL_BASED_OUTPATIENT_CLINIC_OR_DEPARTMENT_OTHER): Payer: Self-pay | Admitting: Emergency Medicine

## 2021-02-03 ENCOUNTER — Emergency Department (HOSPITAL_BASED_OUTPATIENT_CLINIC_OR_DEPARTMENT_OTHER): Payer: BC Managed Care – PPO

## 2021-02-03 DIAGNOSIS — I714 Abdominal aortic aneurysm, without rupture, unspecified: Secondary | ICD-10-CM | POA: Diagnosis present

## 2021-02-03 DIAGNOSIS — Z7982 Long term (current) use of aspirin: Secondary | ICD-10-CM

## 2021-02-03 DIAGNOSIS — I130 Hypertensive heart and chronic kidney disease with heart failure and stage 1 through stage 4 chronic kidney disease, or unspecified chronic kidney disease: Secondary | ICD-10-CM | POA: Diagnosis not present

## 2021-02-03 DIAGNOSIS — I361 Nonrheumatic tricuspid (valve) insufficiency: Secondary | ICD-10-CM | POA: Diagnosis not present

## 2021-02-03 DIAGNOSIS — E785 Hyperlipidemia, unspecified: Secondary | ICD-10-CM | POA: Diagnosis not present

## 2021-02-03 DIAGNOSIS — I5020 Unspecified systolic (congestive) heart failure: Secondary | ICD-10-CM | POA: Diagnosis present

## 2021-02-03 DIAGNOSIS — E860 Dehydration: Secondary | ICD-10-CM | POA: Diagnosis present

## 2021-02-03 DIAGNOSIS — E876 Hypokalemia: Secondary | ICD-10-CM | POA: Diagnosis present

## 2021-02-03 DIAGNOSIS — R778 Other specified abnormalities of plasma proteins: Secondary | ICD-10-CM | POA: Diagnosis present

## 2021-02-03 DIAGNOSIS — N179 Acute kidney failure, unspecified: Principal | ICD-10-CM | POA: Diagnosis present

## 2021-02-03 DIAGNOSIS — T501X5A Adverse effect of loop [high-ceiling] diuretics, initial encounter: Secondary | ICD-10-CM | POA: Diagnosis not present

## 2021-02-03 DIAGNOSIS — Z79899 Other long term (current) drug therapy: Secondary | ICD-10-CM | POA: Diagnosis not present

## 2021-02-03 DIAGNOSIS — Z8546 Personal history of malignant neoplasm of prostate: Secondary | ICD-10-CM | POA: Diagnosis not present

## 2021-02-03 DIAGNOSIS — I959 Hypotension, unspecified: Secondary | ICD-10-CM | POA: Diagnosis not present

## 2021-02-03 DIAGNOSIS — Z87891 Personal history of nicotine dependence: Secondary | ICD-10-CM

## 2021-02-03 DIAGNOSIS — I5043 Acute on chronic combined systolic (congestive) and diastolic (congestive) heart failure: Secondary | ICD-10-CM | POA: Diagnosis not present

## 2021-02-03 DIAGNOSIS — N183 Chronic kidney disease, stage 3 unspecified: Secondary | ICD-10-CM | POA: Diagnosis present

## 2021-02-03 DIAGNOSIS — Z87442 Personal history of urinary calculi: Secondary | ICD-10-CM | POA: Diagnosis not present

## 2021-02-03 DIAGNOSIS — I428 Other cardiomyopathies: Secondary | ICD-10-CM | POA: Diagnosis not present

## 2021-02-03 DIAGNOSIS — I5042 Chronic combined systolic (congestive) and diastolic (congestive) heart failure: Secondary | ICD-10-CM | POA: Diagnosis present

## 2021-02-03 DIAGNOSIS — I4729 Other ventricular tachycardia: Secondary | ICD-10-CM

## 2021-02-03 DIAGNOSIS — N1831 Chronic kidney disease, stage 3a: Secondary | ICD-10-CM | POA: Diagnosis present

## 2021-02-03 DIAGNOSIS — E870 Hyperosmolality and hypernatremia: Secondary | ICD-10-CM | POA: Diagnosis present

## 2021-02-03 DIAGNOSIS — I10 Essential (primary) hypertension: Secondary | ICD-10-CM | POA: Diagnosis present

## 2021-02-03 DIAGNOSIS — R110 Nausea: Secondary | ICD-10-CM

## 2021-02-03 DIAGNOSIS — I472 Ventricular tachycardia: Secondary | ICD-10-CM | POA: Diagnosis not present

## 2021-02-03 DIAGNOSIS — I420 Dilated cardiomyopathy: Secondary | ICD-10-CM | POA: Diagnosis not present

## 2021-02-03 DIAGNOSIS — I712 Thoracic aortic aneurysm, without rupture: Secondary | ICD-10-CM | POA: Diagnosis present

## 2021-02-03 DIAGNOSIS — Z20822 Contact with and (suspected) exposure to covid-19: Secondary | ICD-10-CM | POA: Diagnosis present

## 2021-02-03 DIAGNOSIS — I252 Old myocardial infarction: Secondary | ICD-10-CM | POA: Diagnosis not present

## 2021-02-03 DIAGNOSIS — R9431 Abnormal electrocardiogram [ECG] [EKG]: Secondary | ICD-10-CM | POA: Diagnosis present

## 2021-02-03 DIAGNOSIS — I351 Nonrheumatic aortic (valve) insufficiency: Secondary | ICD-10-CM | POA: Diagnosis not present

## 2021-02-03 DIAGNOSIS — E871 Hypo-osmolality and hyponatremia: Secondary | ICD-10-CM | POA: Diagnosis present

## 2021-02-03 DIAGNOSIS — E861 Hypovolemia: Secondary | ICD-10-CM | POA: Diagnosis present

## 2021-02-03 LAB — CBC WITH DIFFERENTIAL/PLATELET
Abs Immature Granulocytes: 0.03 10*3/uL (ref 0.00–0.07)
Basophils Absolute: 0 10*3/uL (ref 0.0–0.1)
Basophils Relative: 0 %
Eosinophils Absolute: 0 10*3/uL (ref 0.0–0.5)
Eosinophils Relative: 0 %
HCT: 41.4 % (ref 39.0–52.0)
Hemoglobin: 14.5 g/dL (ref 13.0–17.0)
Immature Granulocytes: 0 %
Lymphocytes Relative: 24 %
Lymphs Abs: 2.9 10*3/uL (ref 0.7–4.0)
MCH: 33.7 pg (ref 26.0–34.0)
MCHC: 35 g/dL (ref 30.0–36.0)
MCV: 96.3 fL (ref 80.0–100.0)
Monocytes Absolute: 0.8 10*3/uL (ref 0.1–1.0)
Monocytes Relative: 7 %
Neutro Abs: 8.2 10*3/uL — ABNORMAL HIGH (ref 1.7–7.7)
Neutrophils Relative %: 69 %
Platelets: 380 10*3/uL (ref 150–400)
RBC: 4.3 MIL/uL (ref 4.22–5.81)
RDW: 13.9 % (ref 11.5–15.5)
WBC: 12.1 10*3/uL — ABNORMAL HIGH (ref 4.0–10.5)
nRBC: 0 % (ref 0.0–0.2)

## 2021-02-03 LAB — LACTIC ACID, PLASMA: Lactic Acid, Venous: 1.6 mmol/L (ref 0.5–1.9)

## 2021-02-03 LAB — COMPREHENSIVE METABOLIC PANEL
ALT: 13 U/L (ref 0–44)
AST: 19 U/L (ref 15–41)
Albumin: 4 g/dL (ref 3.5–5.0)
Alkaline Phosphatase: 51 U/L (ref 38–126)
Anion gap: 10 (ref 5–15)
BUN: 29 mg/dL — ABNORMAL HIGH (ref 6–20)
CO2: 25 mmol/L (ref 22–32)
Calcium: 9 mg/dL (ref 8.9–10.3)
Chloride: 97 mmol/L — ABNORMAL LOW (ref 98–111)
Creatinine, Ser: 2.06 mg/dL — ABNORMAL HIGH (ref 0.61–1.24)
GFR, Estimated: 37 mL/min — ABNORMAL LOW (ref >60)
Glucose, Bld: 154 mg/dL — ABNORMAL HIGH (ref 70–99)
Potassium: 3.1 mmol/L — ABNORMAL LOW (ref 3.5–5.1)
Sodium: 132 mmol/L — ABNORMAL LOW (ref 135–145)
Total Bilirubin: 0.7 mg/dL (ref 0.3–1.2)
Total Protein: 7.7 g/dL (ref 6.5–8.1)

## 2021-02-03 LAB — TROPONIN I (HIGH SENSITIVITY)
Troponin I (High Sensitivity): 78 ng/L — ABNORMAL HIGH (ref <18)
Troponin I (High Sensitivity): 65 ng/L — ABNORMAL HIGH (ref <18)

## 2021-02-03 LAB — RESP PANEL BY RT-PCR (FLU A&B, COVID) ARPGX2
Influenza A by PCR: NEGATIVE
Influenza B by PCR: NEGATIVE
SARS Coronavirus 2 by RT PCR: NEGATIVE

## 2021-02-03 LAB — BRAIN NATRIURETIC PEPTIDE: B Natriuretic Peptide: 107.4 pg/mL — ABNORMAL HIGH (ref 0.0–100.0)

## 2021-02-03 LAB — MAGNESIUM: Magnesium: 2.9 mg/dL — ABNORMAL HIGH (ref 1.7–2.4)

## 2021-02-03 LAB — LIPASE, BLOOD: Lipase: 32 U/L (ref 11–51)

## 2021-02-03 MED ORDER — SODIUM CHLORIDE 0.9 % IV SOLN: INTRAVENOUS | Status: AC

## 2021-02-03 MED ORDER — PROCHLORPERAZINE EDISYLATE 10 MG/2ML IJ SOLN: 5.0000 mg | INTRAMUSCULAR | Status: DC | PRN

## 2021-02-03 MED ORDER — SODIUM CHLORIDE 0.9 % IV SOLN: INTRAVENOUS | Status: DC

## 2021-02-03 MED ORDER — SODIUM CHLORIDE 0.9 % IV BOLUS: 500.0000 mL | Freq: Once | INTRAVENOUS | Status: DC

## 2021-02-03 MED ORDER — ONDANSETRON HCL 4 MG/2ML IJ SOLN
4.0000 mg | Freq: Once | INTRAMUSCULAR | Status: DC
  Filled 2021-02-03: qty 2

## 2021-02-03 MED ORDER — POTASSIUM CHLORIDE CRYS ER 20 MEQ PO TBCR
40.0000 meq | EXTENDED_RELEASE_TABLET | Freq: Once | ORAL | Status: AC
  Administered 2021-02-03: 40 meq via ORAL
  Filled 2021-02-03: qty 2

## 2021-02-03 MED ORDER — SODIUM CHLORIDE 0.9 % IV BOLUS
500.0000 mL | Freq: Once | INTRAVENOUS | Status: AC
  Administered 2021-02-03: 500 mL via INTRAVENOUS

## 2021-02-03 NOTE — ED Notes (Signed)
Pt baseline BP laying flat is 86/60.  Did not finish orthos, will have to finish after fluids.

## 2021-02-03 NOTE — ED Notes (Signed)
Portable Xray at bedside.

## 2021-02-03 NOTE — ED Notes (Signed)
Spoke with Canilla in lab to add on BNP

## 2021-02-03 NOTE — ED Provider Notes (Signed)
[facility] EMERGENCY DEPARTMENT Provider Note   CSN: [id number] Arrival date & time: 02/03/21  1547     History Chief Complaint  Patient presents with  . Nausea    Darius Rivers is a 60 y.o. male.  HPI   Patient with significant medical history of CKD stage II, a sending aortic aneurysm, hypertension, CHF ejection fraction 40 to 45% presents with chief complaint of feeling nauseous.  He endorses that nausea has been going on since Friday, he states it happened after he had a slice of strawberry cheesecake.  He states he felt fine up until then, he now has nausea which worsens with eating, denies any episode of vomiting, denies abdominal pain, constipation, diarrhea, urinary symptoms.  He endorses that he has been drinking fluids without difficulty but but is just unable to tolerate p.o. He denies  other factors at this time.  It was noted that patient was hypotensive on arrival, he endorses that he felt slightly dizzy while he was walking over here today with no other symptoms, he denies chest pain, shortness of breath, orthopnea, worsening pedal edema.  States he had taken all of his medications as prescribed.  Denies  alleviating factors.  Patient denies headaches, fevers, chills, shortness of breath, chest pain, abdominal pain, vomiting, diarrhea, worsening pedal edema.  Past Medical History:  Diagnosis Date  . Ascending aortic aneurysm ([place])   . CKD (chronic kidney disease), stage II   . Hyperlipidemia   . Hypertension   . Nephrolithiasis   . Prostate cancer ([place])   . Systolic CHF [facility])     Patient Active Problem List   Diagnosis Date Noted  . NSVT (nonsustained ventricular tachycardia) ([place]) 11/14/2018  . AKI (acute kidney injury) ([place]) 11/11/2018  . Troponin I above reference range 11/11/2018  . Hypotension 11/11/2018  . Dizziness 11/11/2018  . Hyperlipidemia 11/11/2018  . Chronic systolic CHF (congestive heart failure) ([place]) 11/11/2018  . CKD (chronic  kidney disease), stage III ([place]) 11/11/2018  . Hyponatremia 11/11/2018  . Hypokalemia 11/11/2018  . Lactic acid increased 11/11/2018  . Bilateral nephrolithiasis 11/11/2018    Past Surgical History:  Procedure Laterality Date  . CYSTOSCOPY WITH URETEROSCOPY, STONE BASKETRY AND STENT PLACEMENT     "put a stent in; pulled stent out later"  . INGUINAL HERNIA REPAIR Bilateral 1980s  . PROSTATE BIOPSY         Family History  Problem Relation Age of Onset  . High blood pressure Mother   . Heart attack Neg Hx   . Cancer Neg Hx   . Diabetes Neg Hx     Social History   Tobacco Use  . Smoking status: Former Smoker    Packs/day: 0.33    Years: 20.00    Pack years: 6.60    Types: Cigarettes    Quit date: 11/11/1998    Years since quitting: 22.2  . Smokeless tobacco: Never Used  Vaping Use  . Vaping Use: Never used  Substance Use Topics  . Alcohol use: Yes    Comment: 11/15/2018 "mixed drink q 2-3 weeks; if that"  . Drug use: Not Currently    Types: Marijuana    Comment: 11/15/2018 "nothing since fall of 2019"    Home Medications Prior to Admission medications   Medication Sig Start Date End Date Taking? Authorizing Provider  amLODipine (NORVASC) 10 MG tablet Take 10 mg by mouth daily.  11/04/18  Yes [provider]  aspirin EC 81 MG tablet Take 81 mg by  mouth daily.   Yes [provider]  carvedilol (COREG) 6.25 MG tablet TAKE 3 TABLETS BY MOUTH 2 TIMES DAILY WITH A MEAL 06/14/19  Yes Barrett, Rhonda G, PA-C  furosemide (LASIX) 40 MG tablet Take 40 mg by mouth daily.  09/15/16  Yes [provider]  isosorbide mononitrate (IMDUR) 30 MG 24 hr tablet TAKE 1 TABLET BY MOUTH DAILY 06/02/19  Yes Hilty, Nadean Corwin, MD  rosuvastatin (CRESTOR) 40 MG tablet Take 1 tablet (40 mg total) by mouth daily at 6 PM. 12/07/18  Yes Barrett, Evelene Croon, PA-C  Vitamin D, Ergocalciferol, (DRISDOL) 1.25 MG (50000 UT) CAPS capsule Take 50,000 Units by mouth every 7 (seven) days.   10/05/17  Yes [provider]  docusate sodium (COLACE) 100 MG capsule Take 1 capsule (100 mg total) by mouth 2 (two) times daily as needed for mild constipation. 11/15/18   Desiree Hane, MD  lisinopril (PRINIVIL,ZESTRIL) 40 MG tablet HOLD UNTIL SEEN BY YOUR PCP 11/15/18   Desiree Hane, MD  promethazine (PHENERGAN) 25 MG tablet Take 1 tablet (25 mg total) by mouth every 6 (six) hours as needed for nausea or vomiting. 11/05/18   Lawyer, Harrell Gave, PA-C    Allergies    Patient has no known allergies.  Review of Systems   Review of Systems  Constitutional: Negative for chills and fever.  HENT: Negative for congestion and trouble swallowing.   Eyes: Negative for visual disturbance.  Respiratory: Negative for shortness of breath.   Cardiovascular: Negative for chest pain.  Gastrointestinal: Positive for nausea. Negative for abdominal pain, constipation, diarrhea and vomiting.  Genitourinary: Negative for enuresis and flank pain.  Musculoskeletal: Negative for back pain.  Skin: Negative for rash.  Neurological: Positive for dizziness.  Hematological: Does not bruise/bleed easily.    Physical Exam Updated Vital Signs BP (!) 165/107   Pulse 68   Temp 98.7 F (37.1 C) (Oral)   Resp 16   Ht 6' 4.5" (1.943 m)   Wt 88.5 kg   SpO2 100%   BMI 23.43 kg/m   Physical Exam Vitals and nursing note reviewed.  Constitutional:      General: He is not in acute distress.    Appearance: He is not ill-appearing.  HENT:     Head: Normocephalic and atraumatic.     Nose: No congestion.     Mouth/Throat:     Mouth: Mucous membranes are moist.     Pharynx: Oropharynx is clear. No oropharyngeal exudate or posterior oropharyngeal erythema.  Eyes:     Conjunctiva/sclera: Conjunctivae normal.  Cardiovascular:     Rate and Rhythm: Normal rate and regular rhythm.     Pulses: Normal pulses.     Heart sounds: No murmur heard. No friction rub. No gallop.   Pulmonary:     Effort: No  respiratory distress.     Breath sounds: No wheezing, rhonchi or rales.  Abdominal:     Tenderness: There is no abdominal tenderness. There is no right CVA tenderness or left CVA tenderness.     Comments: Abdomen was visualized it was nondistended, normal bowel sounds, dull to percussion.  Nontender to palpation, no rebound tenderness, or peritoneal sign.  Musculoskeletal:     Right lower leg: No edema.     Left lower leg: No edema.  Skin:    General: Skin is warm and dry.  Neurological:     Mental Status: He is alert.  Psychiatric:        Mood and  Affect: Mood normal.     ED Results / Procedures / Treatments   Labs (all labs ordered are listed, but only abnormal results are displayed) Labs Reviewed  COMPREHENSIVE METABOLIC PANEL - Abnormal; Notable for the following components:      Result Value   Sodium 132 (*)    Potassium 3.1 (*)    Chloride 97 (*)    Glucose, Bld 154 (*)    BUN 29 (*)    Creatinine, Ser 2.06 (*)    GFR, Estimated 37 (*)    All other components within normal limits  CBC WITH DIFFERENTIAL/PLATELET - Abnormal; Notable for the following components:   WBC 12.1 (*)    Neutro Abs 8.2 (*)    All other components within normal limits  BRAIN NATRIURETIC PEPTIDE - Abnormal; Notable for the following components:   B Natriuretic Peptide 107.4 (*)    All other components within normal limits  MAGNESIUM - Abnormal; Notable for the following components:   Magnesium 2.9 (*)    All other components within normal limits  TROPONIN I (HIGH SENSITIVITY) - Abnormal; Notable for the following components:   Troponin I (High Sensitivity) 78 (*)    All other components within normal limits  TROPONIN I (HIGH SENSITIVITY) - Abnormal; Notable for the following components:   Troponin I (High Sensitivity) 65 (*)    All other components within normal limits  RESP PANEL BY RT-PCR (FLU A&B, COVID) ARPGX2  LIPASE, BLOOD  LACTIC ACID, PLASMA  LACTIC ACID, PLASMA  URINALYSIS,  ROUTINE W REFLEX MICROSCOPIC    EKG EKG Interpretation  Date/Time:  Sunday February 03 2021 16:37:08 EDT Ventricular Rate:  69 PR Interval:  161 QRS Duration: 114 QT Interval:  469 QTC Calculation: 503 R Axis:   -44 Text Interpretation: Sinus rhythm LVH with IVCD, LAD and secondary repol abnrm Prolonged QT interval no sig change from previous, inferior lateral st changes old Confirmed by Charlesetta Shanks [phone]) on 02/03/2021 5:38:07 PM   Radiology DG Chest 1 View  Result Date: 02/03/2021 CLINICAL DATA:  Nausea. EXAM: CHEST  1 VIEW COMPARISON:  November 10, 2018 FINDINGS: There is stable prominence of the mediastinum in the region of the ascending thoracic aorta secondary to the patient's known thoracic aortic aneurysm. The heart size and mediastinal contours are otherwise within normal limits. Both lungs are clear. The visualized skeletal structures are unremarkable. IMPRESSION: 1. Stable exam without evidence of active cardiopulmonary disease. Electronically Signed   By: Virgina Norfolk M.D.   On: 02/03/2021 17:17    Procedures Procedures   Medications Ordered in ED Medications  ondansetron (ZOFRAN) injection 4 mg (0 mg Intravenous Hold 02/03/21 1712)  sodium chloride 0.9 % bolus 500 mL (0 mLs Intravenous Hold 02/03/21 1918)  sodium chloride 0.9 % bolus 500 mL ( Intravenous Stopped 02/03/21 1801)  potassium chloride SA (KLOR-CON) CR tablet 40 mEq (40 mEq Oral Given 02/03/21 1858)    ED Course  I have reviewed the triage vital signs and the nursing notes.  Pertinent labs & imaging results that were available during my care of the patient were reviewed by me and considered in my medical decision making (see chart for details).    MDM Rules/Calculators/A&P                         Initial impression-patient presents with nausea.  He is alert, does not appear in acute distress, vital signs notable for hypotensive, concern for dehydration versus CHF  exacerbation.  Will obtain basic lab work,  chest x-ray, EKG, provide patient with fluids and reassess.  Work-up-CBC shows slight leukocytosis of 12.1, no signs anemia, CMP shows slight hyper natremia of 132, hypokalemia 3.1, hyperglycemia of 154, elevated BUN of 29, elevated creatinine 2.06.  Initial troponin is 78, BNP 107, lipase 32, Covid test negative chest x-ray negative for acute abnormalities EKG sinus rhythm at signs of ischemia no ST elevation depression noted.  Orthostatics positive laying down BP 11/23/1931 standing 99/88  Reassessment patient reassessed I provided him with some fluids, cells no complaints this time, vital signs have improved slightly systolic 16/10, will continue to monitor.  Will provide another 500 cc of fluids and reassess.  Patient is reassessed, continues no complaint at this time, vital signs remained stable.  Recommend the patient should be admitted due to AKI secondary due to dehydration.  Patient is agreeable to this.  Will consult with hospitalist for admission.  Consult spoke with Dr. Olevia Bowens of hospitalist team he has accepted patient.  He will arrange for transport and will assess patient on arrival.  Rule out- I have low suspicion for ACS as history is atypical, patient has no cardiac history, EKG was sinus rhythm without signs of ischemia, patient has noted elevated troponins but I suspect this is secondary due to CKD, troponins are downtrending.  Low suspicion for PE as patient denies pleuritic chest pain, shortness of breath, patient denies leg pain, no pedal edema noted on exam, vital signs reassuring.  low suspicion for AAA or aortic dissection as history is atypical, no bulging mass on my exam.  Low suspicion for intra-abdominal abnormality as abdomen is soft nontender palpation, patient denies nausea or vomiting at this time.  Low suspicion for systemic infection as patient is nontoxic-appearing, vital signs reassuring, no obvious source infection noted on exam.  Patient has noted hypokalemia suspect  this secondary due to Lasix use, patient also has noted elevated magnesium most likely exacerbated by dehydration as well as low potassium.   Plan-admit to medicine for AKI secondary due to dehydration.  Anticipate patient will need further IV management and further monitoring of his AKI.   Final Clinical Impression(s) / ED Diagnoses Final diagnoses:  Nausea  AKI (acute kidney injury) ([place])  Hypokalemia    Rx / DC Orders ED Discharge Orders    None       Aron Baba 02/03/21 1958    Charlesetta Shanks, MD 02/13/21 [phone]

## 2021-02-03 NOTE — ED Notes (Signed)
5 beat run of VTACH, EKG strip printed and provided to EDP. Frequent PVCs.

## 2021-02-03 NOTE — ED Triage Notes (Signed)
Reports nausea since eating a piece of strawberry short cake that had sat on the table for two days.  Denies any vomiting, diarrhea, or pain.  Reports he is drinking plenty of fluids.

## 2021-02-03 NOTE — ED Provider Notes (Incomplete)
I provided a substantive portion of the care of this patient.  I personally performed the entirety of the {Shared Choices:25152} for this encounter. {Remember to document shared critical care using "edcritical" dot phrase:1}    

## 2021-02-03 NOTE — ED Notes (Signed)
IVF held at this time d/t BP elevated now- EDP aware

## 2021-02-03 NOTE — ED Notes (Signed)
Lab called regarding add on mag level

## 2021-02-03 NOTE — ED Notes (Signed)
Ginger Ale and graham crackers provided for po challenge

## 2021-02-04 ENCOUNTER — Encounter (HOSPITAL_COMMUNITY): Payer: Self-pay | Admitting: Internal Medicine

## 2021-02-04 ENCOUNTER — Observation Stay (HOSPITAL_COMMUNITY): Payer: BC Managed Care – PPO

## 2021-02-04 DIAGNOSIS — I472 Ventricular tachycardia: Secondary | ICD-10-CM

## 2021-02-04 DIAGNOSIS — I351 Nonrheumatic aortic (valve) insufficiency: Secondary | ICD-10-CM | POA: Diagnosis not present

## 2021-02-04 DIAGNOSIS — I10 Essential (primary) hypertension: Secondary | ICD-10-CM | POA: Diagnosis not present

## 2021-02-04 DIAGNOSIS — I712 Thoracic aortic aneurysm, without rupture: Secondary | ICD-10-CM | POA: Diagnosis not present

## 2021-02-04 DIAGNOSIS — E870 Hyperosmolality and hypernatremia: Secondary | ICD-10-CM | POA: Diagnosis not present

## 2021-02-04 DIAGNOSIS — E876 Hypokalemia: Secondary | ICD-10-CM | POA: Diagnosis not present

## 2021-02-04 DIAGNOSIS — I361 Nonrheumatic tricuspid (valve) insufficiency: Secondary | ICD-10-CM | POA: Diagnosis not present

## 2021-02-04 DIAGNOSIS — N179 Acute kidney failure, unspecified: Secondary | ICD-10-CM | POA: Diagnosis not present

## 2021-02-04 DIAGNOSIS — Z79899 Other long term (current) drug therapy: Secondary | ICD-10-CM | POA: Diagnosis not present

## 2021-02-04 DIAGNOSIS — T501X5A Adverse effect of loop [high-ceiling] diuretics, initial encounter: Secondary | ICD-10-CM | POA: Diagnosis present

## 2021-02-04 DIAGNOSIS — Z8546 Personal history of malignant neoplasm of prostate: Secondary | ICD-10-CM

## 2021-02-04 DIAGNOSIS — I420 Dilated cardiomyopathy: Secondary | ICD-10-CM | POA: Diagnosis not present

## 2021-02-04 DIAGNOSIS — I714 Abdominal aortic aneurysm, without rupture, unspecified: Secondary | ICD-10-CM | POA: Diagnosis present

## 2021-02-04 DIAGNOSIS — I959 Hypotension, unspecified: Secondary | ICD-10-CM | POA: Diagnosis present

## 2021-02-04 DIAGNOSIS — I5043 Acute on chronic combined systolic (congestive) and diastolic (congestive) heart failure: Secondary | ICD-10-CM

## 2021-02-04 DIAGNOSIS — Z87891 Personal history of nicotine dependence: Secondary | ICD-10-CM | POA: Diagnosis not present

## 2021-02-04 DIAGNOSIS — Z20822 Contact with and (suspected) exposure to covid-19: Secondary | ICD-10-CM | POA: Diagnosis not present

## 2021-02-04 DIAGNOSIS — E860 Dehydration: Secondary | ICD-10-CM | POA: Diagnosis not present

## 2021-02-04 DIAGNOSIS — I5042 Chronic combined systolic (congestive) and diastolic (congestive) heart failure: Secondary | ICD-10-CM | POA: Diagnosis not present

## 2021-02-04 DIAGNOSIS — Z7982 Long term (current) use of aspirin: Secondary | ICD-10-CM | POA: Diagnosis not present

## 2021-02-04 DIAGNOSIS — E785 Hyperlipidemia, unspecified: Secondary | ICD-10-CM | POA: Diagnosis present

## 2021-02-04 DIAGNOSIS — I428 Other cardiomyopathies: Secondary | ICD-10-CM | POA: Diagnosis not present

## 2021-02-04 DIAGNOSIS — I130 Hypertensive heart and chronic kidney disease with heart failure and stage 1 through stage 4 chronic kidney disease, or unspecified chronic kidney disease: Secondary | ICD-10-CM | POA: Diagnosis not present

## 2021-02-04 DIAGNOSIS — N1831 Chronic kidney disease, stage 3a: Secondary | ICD-10-CM | POA: Diagnosis not present

## 2021-02-04 DIAGNOSIS — I252 Old myocardial infarction: Secondary | ICD-10-CM | POA: Diagnosis not present

## 2021-02-04 DIAGNOSIS — E861 Hypovolemia: Secondary | ICD-10-CM | POA: Diagnosis present

## 2021-02-04 DIAGNOSIS — Z87442 Personal history of urinary calculi: Secondary | ICD-10-CM | POA: Diagnosis not present

## 2021-02-04 LAB — URINALYSIS, ROUTINE W REFLEX MICROSCOPIC
Bilirubin Urine: NEGATIVE
Glucose, UA: NEGATIVE mg/dL
Ketones, ur: NEGATIVE mg/dL
Leukocytes,Ua: NEGATIVE
Nitrite: NEGATIVE
Protein, ur: NEGATIVE mg/dL
Specific Gravity, Urine: 1.025 (ref 1.005–1.030)
pH: 6 (ref 5.0–8.0)

## 2021-02-04 LAB — ECHOCARDIOGRAM COMPLETE
Height: 76.5 in
P 1/2 time: 595 msec
S' Lateral: 5 cm
Single Plane A4C EF: 24.1 %
Weight: 3120 oz

## 2021-02-04 LAB — URINALYSIS, MICROSCOPIC (REFLEX)

## 2021-02-04 LAB — BASIC METABOLIC PANEL
Anion gap: 7 (ref 5–15)
BUN: 25 mg/dL — ABNORMAL HIGH (ref 6–20)
CO2: 25 mmol/L (ref 22–32)
Calcium: 8.8 mg/dL — ABNORMAL LOW (ref 8.9–10.3)
Chloride: 101 mmol/L (ref 98–111)
Creatinine, Ser: 1.39 mg/dL — ABNORMAL HIGH (ref 0.61–1.24)
GFR, Estimated: 59 mL/min — ABNORMAL LOW (ref >60)
Glucose, Bld: 113 mg/dL — ABNORMAL HIGH (ref 70–99)
Potassium: 3.6 mmol/L (ref 3.5–5.1)
Sodium: 133 mmol/L — ABNORMAL LOW (ref 135–145)

## 2021-02-04 LAB — CBC
HCT: 38.6 % — ABNORMAL LOW (ref 39.0–52.0)
Hemoglobin: 13.7 g/dL (ref 13.0–17.0)
MCH: 34 pg (ref 26.0–34.0)
MCHC: 35.5 g/dL (ref 30.0–36.0)
MCV: 95.8 fL (ref 80.0–100.0)
Platelets: 341 10*3/uL (ref 150–400)
RBC: 4.03 MIL/uL — ABNORMAL LOW (ref 4.22–5.81)
RDW: 13.7 % (ref 11.5–15.5)
WBC: 12.6 10*3/uL — ABNORMAL HIGH (ref 4.0–10.5)
nRBC: 0 % (ref 0.0–0.2)

## 2021-02-04 LAB — HIV ANTIBODY (ROUTINE TESTING W REFLEX): HIV Screen 4th Generation wRfx: NONREACTIVE

## 2021-02-04 LAB — TSH: TSH: 0.637 u[IU]/mL (ref 0.350–4.500)

## 2021-02-04 MED ORDER — LABETALOL HCL 5 MG/ML IV SOLN
10.0000 mg | INTRAVENOUS | Status: DC | PRN
  Administered 2021-02-04 – 2021-02-05 (×2): 10 mg via INTRAVENOUS
  Filled 2021-02-04 (×2): qty 4

## 2021-02-04 MED ORDER — ISOSORBIDE MONONITRATE ER 30 MG PO TB24
30.0000 mg | ORAL_TABLET | Freq: Every day | ORAL | Status: DC
  Administered 2021-02-04 – 2021-02-06 (×3): 30 mg via ORAL
  Filled 2021-02-04 (×4): qty 1

## 2021-02-04 MED ORDER — METOPROLOL TARTRATE 5 MG/5ML IV SOLN
5.0000 mg | Freq: Once | INTRAVENOUS | Status: AC
  Administered 2021-02-04: 5 mg via INTRAVENOUS
  Filled 2021-02-04: qty 5

## 2021-02-04 MED ORDER — ACETAMINOPHEN 650 MG RE SUPP: 650.0000 mg | Freq: Four times a day (QID) | RECTAL | Status: DC | PRN

## 2021-02-04 MED ORDER — POLYETHYLENE GLYCOL 3350 17 G PO PACK: 17.0000 g | PACK | Freq: Every day | ORAL | Status: DC | PRN

## 2021-02-04 MED ORDER — DOCUSATE SODIUM 100 MG PO CAPS
100.0000 mg | ORAL_CAPSULE | Freq: Two times a day (BID) | ORAL | Status: DC
  Administered 2021-02-04 – 2021-02-07 (×6): 100 mg via ORAL
  Filled 2021-02-04 (×6): qty 1

## 2021-02-04 MED ORDER — CARVEDILOL 6.25 MG PO TABS
6.2500 mg | ORAL_TABLET | Freq: Two times a day (BID) | ORAL | Status: DC
  Administered 2021-02-04 – 2021-02-05 (×3): 6.25 mg via ORAL
  Filled 2021-02-04 (×4): qty 1

## 2021-02-04 MED ORDER — ASPIRIN EC 81 MG PO TBEC
81.0000 mg | DELAYED_RELEASE_TABLET | Freq: Every day | ORAL | Status: DC
  Administered 2021-02-04 – 2021-02-07 (×4): 81 mg via ORAL
  Filled 2021-02-04 (×4): qty 1

## 2021-02-04 MED ORDER — BISACODYL 5 MG PO TBEC: 5.0000 mg | DELAYED_RELEASE_TABLET | Freq: Every day | ORAL | Status: DC | PRN

## 2021-02-04 MED ORDER — HYDROCODONE-ACETAMINOPHEN 5-325 MG PO TABS: 1.0000 | ORAL_TABLET | ORAL | Status: DC | PRN

## 2021-02-04 MED ORDER — ROSUVASTATIN CALCIUM 20 MG PO TABS
40.0000 mg | ORAL_TABLET | Freq: Every day | ORAL | Status: DC
  Administered 2021-02-04 – 2021-02-06 (×3): 40 mg via ORAL
  Filled 2021-02-04: qty 1
  Filled 2021-02-04 (×3): qty 2

## 2021-02-04 MED ORDER — MORPHINE SULFATE (PF) 2 MG/ML IV SOLN: 2.0000 mg | INTRAVENOUS | Status: DC | PRN

## 2021-02-04 MED ORDER — HYDRALAZINE HCL 20 MG/ML IJ SOLN: 5.0000 mg | INTRAMUSCULAR | Status: DC | PRN

## 2021-02-04 MED ORDER — ACETAMINOPHEN 325 MG PO TABS: 650.0000 mg | ORAL_TABLET | Freq: Four times a day (QID) | ORAL | Status: DC | PRN

## 2021-02-04 MED ORDER — SACUBITRIL-VALSARTAN 24-26 MG PO TABS
1.0000 | ORAL_TABLET | Freq: Two times a day (BID) | ORAL | Status: DC
  Administered 2021-02-04 – 2021-02-06 (×5): 1 via ORAL
  Filled 2021-02-04 (×5): qty 1

## 2021-02-04 MED ORDER — AMLODIPINE BESYLATE 10 MG PO TABS
10.0000 mg | ORAL_TABLET | Freq: Every day | ORAL | Status: DC
  Administered 2021-02-04: 10 mg via ORAL
  Filled 2021-02-04: qty 1

## 2021-02-04 MED ORDER — SODIUM CHLORIDE 0.9% FLUSH
3.0000 mL | Freq: Two times a day (BID) | INTRAVENOUS | Status: DC
  Administered 2021-02-04 – 2021-02-06 (×4): 3 mL via INTRAVENOUS

## 2021-02-04 MED ORDER — ENOXAPARIN SODIUM 40 MG/0.4ML ~~LOC~~ SOLN
40.0000 mg | Freq: Every day | SUBCUTANEOUS | Status: DC
  Administered 2021-02-04 – 2021-02-05 (×2): 40 mg via SUBCUTANEOUS
  Filled 2021-02-04 (×3): qty 0.4

## 2021-02-04 NOTE — Progress Notes (Signed)
Pt arrived from [facility]. Pt just had 35 beat run of VT while in bed.  Pt denied chest discomfort or SOB.  Admission was paged to inquire attending MD.  Idolina Primer, RN

## 2021-02-04 NOTE — Consult Note (Signed)
Cardiology Consultation:   Patient ID: Darius Rivers MRN: [id number]; DOB: December 21, 1960  Admit date: 02/03/2021 Date of Consult: 02/04/2021  PCP:  Fortino Sic, [place]  Cardiologist:  Pixie Casino, MD  Advanced Practice Provider:  No care team member to display Electrophysiologist:  None     Patient Profile:   Darius Rivers is a 60 y.o. male with a hx of hypertension, hyperlipidemia, mild systolic congestive heart failure, non-ST segment elevation myocardial infarction who is being seen today for the evaluation of nonsustained ventricular tachycardia at the request of Dr. Lorin Mercy..  History of Present Illness:   Darius Rivers is a 60 year old gentleman with a history of non-ST segment elevation myocardial infarction, hypertension, hyperlipidemia.  He has CKD  stage II.  He also has been found to have an ascending aortic aneurysm measuring 4.5 to 4.6 cm.  He presented to the ER this am with nausea  This am he was found to have nonsustained VT     He was basically asymptomatic with this episode No syncope He thought he had the hiccups.   Echo this admission  shows EF 25-30% Echo from Jan., 2020 shows EF 40-45% Has not been on CHF meds  Tries to avoid salt. Is active at work.   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 Ascending aortic aneurysm ([place])   . CKD (chronic kidney disease), stage II   . Hyperlipidemia   . Hypertension   . Nephrolithiasis   . Prostate cancer [facility])    watchful waiting, due to go back to urology  . Systolic CHF [facility])     Past Surgical History:  Procedure Laterality Date  . CYSTOSCOPY WITH URETEROSCOPY, STONE BASKETRY AND STENT PLACEMENT     "put a stent in; pulled stent out later"  . INGUINAL HERNIA REPAIR Bilateral 1980s  . PROSTATE BIOPSY       Home Medications:  Prior to Admission medications   Medication Sig  Start Date End Date Taking? Authorizing Provider  amLODipine (NORVASC) 10 MG tablet Take 10 mg by mouth daily.  11/04/18  Yes [provider]  aspirin EC 81 MG tablet Take 81 mg by mouth daily.   Yes [provider]  carvedilol (COREG) 6.25 MG tablet TAKE 3 TABLETS BY MOUTH 2 TIMES DAILY WITH A MEAL Patient taking differently: Take 18.75 mg by mouth 2 (two) times daily with a meal. 06/14/19  Yes Barrett, Evelene Croon, PA-C  furosemide (LASIX) 40 MG tablet Take 40 mg by mouth daily.  09/15/16  Yes [provider]  isosorbide mononitrate (IMDUR) 30 MG 24 hr tablet TAKE 1 TABLET BY MOUTH DAILY Patient taking differently: Take 30 mg by mouth daily. 06/02/19  Yes Hilty, Nadean Corwin, MD  rosuvastatin (CRESTOR) 40 MG tablet Take 1 tablet (40 mg total) by mouth daily at 6 PM. Patient taking differently: Take 5 mg by mouth daily at 6 PM. 12/07/18  Yes Barrett, Evelene Croon, PA-C  Vitamin D, Ergocalciferol, (DRISDOL) 1.25 MG (50000 UT) CAPS capsule Take 50,000 Units by mouth every 7 (seven) days.  Patient not taking: Reported on 02/04/2021 10/05/17   [provider]    Inpatient Medications: Scheduled Meds: . amLODipine  10 mg Oral Daily  . aspirin EC  81 mg Oral Daily  . carvedilol  6.25 mg Oral BID WC  . docusate sodium  100 mg  Oral BID  . enoxaparin (LOVENOX) injection  40 mg Subcutaneous Daily  . isosorbide mononitrate  30 mg Oral Daily  . rosuvastatin  40 mg Oral q1800  . sodium chloride flush  3 mL Intravenous Q12H   Continuous Infusions: . sodium chloride Stopped (02/03/21 1918)   PRN Meds: acetaminophen **OR** acetaminophen, bisacodyl, hydrALAZINE, HYDROcodone-acetaminophen, morphine injection, polyethylene glycol, prochlorperazine  Allergies:   No Known Allergies  Social History:   Social History   Socioeconomic History  . Marital status: Divorced    Spouse name: Not on file  . Number of children: Not on file  . Years of education: Not on file  . Highest  education level: Not on file  Occupational History  . 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 Smoking status: Former Smoker    Packs/day: 0.33    Years: 20.00    Pack years: 6.60    Types: Cigarettes    Quit date: 11/11/1998    Years since quitting: 22.2  . Smokeless tobacco: Never Used  Vaping Use  . Vaping Use: Never used  Substance and Sexual Activity  . Alcohol use: Yes    Comment: 11/15/2018 "mixed drink q 2-3 weeks; if that"  . Drug use: Yes    Types: Marijuana    Comment: occasional use  . Sexual activity: Yes  Other Topics Concern  . Not on file  Social History Narrative  . Not on file   Social Determinants of Health   Financial Resource Strain: Not on file  Food Insecurity: Not on file  Transportation Needs: Not on file  Physical Activity: Not on file  Stress: Not on file  Social Connections: Not on file  Intimate Partner Violence: Not on file    Family History:    Family History  Problem Relation Age of Onset  . High blood pressure Mother   . Heart attack Neg Hx   . Cancer Neg Hx   . Diabetes Neg Hx      ROS:  Please see the history of present illness.   All other ROS reviewed and negative.     Physical Exam/Data:   Vitals:   02/04/21 0730 02/04/21 0915 02/04/21 0943 02/04/21 0959  BP: (!) 177/115 (!) 179/112 (!) 184/111 (!) 184/111  Pulse: 79 78    Resp: 17 20 (!) 24   Temp:  98.6 F (37 C)    TempSrc:  Oral    SpO2: 98% 100%    Weight:    87.6 kg  Height:        Intake/Output Summary (Last 24 hours) at 02/04/2021 1651 Last data filed at 02/04/2021 1546 Gross per 24 hour  Intake 1566.36 ml  Output 375 ml  Net 1191.36 ml   Last 3 Weights 02/04/2021 02/03/2021 03/10/2019  Weight (lbs) 193 lb 3.2 oz 195 lb 185 lb  Weight (kg) 87.635 kg 88.451 kg 83.915 kg     Body mass index is 23.21 kg/m.  General:  Well nourished, well developed, in no acute  distress HEENT: normal Lymph: no adenopathy Neck: no JVD Endocrine:  No thryomegaly Vascular: No carotid bruits; FA pulses 2+ bilaterally without bruits  Cardiac:   RR, tachy,  + S3 gallop  Lungs:  clear to auscultation bilaterally, no wheezing, rhonchi or rales  Abd: soft, nontender, no hepatomegaly  Ext: no edema Musculoskeletal:  No deformities, BUE and BLE strength normal and  equal Skin: warm and dry  Neuro:  CNs 2-12 intact, no focal abnormalities noted Psych:  Normal affect   EKG:  The EKG was personally reviewed and demonstrates:   NSR,  LVH,   Telemetry:  Telemetry was personally reviewed and demonstrates:  Sinus rhythm,   Several PVCs earlier,  35 beat run of nonsustained VT this am at 9:27 am   Relevant CV Studies:   Laboratory Data:  High Sensitivity Troponin:   Recent Labs  Lab 02/03/21 1600 02/03/21 1825  TROPONINIHS 78* 65*     Chemistry Recent Labs  Lab 02/03/21 1600 02/04/21 0633  NA 132* 133*  K 3.1* 3.6  CL 97* 101  CO2 25 25  GLUCOSE 154* 113*  BUN 29* 25*  CREATININE 2.06* 1.39*  CALCIUM 9.0 8.8*  GFRNONAA 37* 59*  ANIONGAP 10 7    Recent Labs  Lab 02/03/21 1600  PROT 7.7  ALBUMIN 4.0  AST 19  ALT 13  ALKPHOS 51  BILITOT 0.7   Hematology Recent Labs  Lab 02/03/21 1600 02/04/21 0633  WBC 12.1* 12.6*  RBC 4.30 4.03*  HGB 14.5 13.7  HCT 41.4 38.6*  MCV 96.3 95.8  MCH 33.7 34.0  MCHC 35.0 35.5  RDW 13.9 13.7  PLT 380 341   BNP Recent Labs  Lab 02/03/21 1600  BNP 107.4*    DDimer No results for input(s): DDIMER in the last 168 hours.   Radiology/Studies:  DG Chest 1 View  Result Date: 02/03/2021 CLINICAL DATA:  Nausea. EXAM: CHEST  1 VIEW COMPARISON:  November 10, 2018 FINDINGS: There is stable prominence of the mediastinum in the region of the ascending thoracic aorta secondary to the patient's known thoracic aortic aneurysm. The heart size and mediastinal contours are otherwise within normal limits. Both lungs are  clear. The visualized skeletal structures are unremarkable. IMPRESSION: 1. Stable exam without evidence of active cardiopulmonary disease. Electronically Signed   By: Virgina Norfolk M.D.   On: 02/03/2021 17:17   ECHOCARDIOGRAM COMPLETE  Result Date: 02/04/2021    ECHOCARDIOGRAM REPORT   Patient Name:   EERO DINI Date of Exam: 02/04/2021 Medical Rec #:  [id number]        Height:       76.5 in Accession #:    [id number]       Weight:       195.0 lb Date of Birth:  1961-08-18         BSA:          2.202 m Patient Age:    44 years         BP:           184/111 mmHg Patient Gender: M                HR:           82 bpm. Exam Location:  Inpatient Procedure: 2D Echo Indications:    ventricular tachycardia  History:        Patient has prior history of Echocardiogram examinations, most                 recent 11/12/2018. Chronic kidney disease; Risk                 Factors:Dyslipidemia.  Sonographer:    Johny Chess Referring Phys: [place]  1. Diffuse hypokinesis worse in the inferior base . Left ventricular ejection fraction, by estimation, is 25 to 30%. The left ventricle has severely decreased  function. The left ventricle has no regional wall motion abnormalities. The left ventricular internal cavity size was moderately dilated. There is mild left ventricular hypertrophy. Left ventricular diastolic parameters were normal.  2. Right ventricular systolic function is normal. The right ventricular size is normal.  3. Left atrial size was mildly dilated.  4. The mitral valve is normal in structure. Trivial mitral valve regurgitation. No evidence of mitral stenosis.  5. The aortic valve was not well visualized. There is moderate calcification of the aortic valve. Aortic valve regurgitation is moderate. Mild to moderate aortic valve sclerosis/calcification is present, without any evidence of aortic stenosis.  6. Aortic dilatation noted. There is moderate dilatation of the ascending aorta,  measuring 44 mm.  7. The inferior vena cava is normal in size with greater than 50% respiratory variability, suggesting right atrial pressure of 3 mmHg. FINDINGS  Left Ventricle: Diffuse hypokinesis worse in the inferior base. Left ventricular ejection fraction, by estimation, is 25 to 30%. The left ventricle has severely decreased function. The left ventricle has no regional wall motion abnormalities. Global longitudinal strain performed but not reported based on interpreter judgement due to suboptimal tracking. 3D left ventricular ejection fraction analysis performed but not reported based on interpreter judgement due to suboptimal quality. The left ventricular internal cavity size was moderately dilated. There is mild left ventricular hypertrophy. Left ventricular diastolic parameters were normal. Right Ventricle: The right ventricular size is normal. No increase in right ventricular wall thickness. Right ventricular systolic function is normal. Left Atrium: Left atrial size was mildly dilated. Right Atrium: Right atrial size was normal in size. Pericardium: There is no evidence of pericardial effusion. Mitral Valve: The mitral valve is normal in structure. Trivial mitral valve regurgitation. No evidence of mitral valve stenosis. Tricuspid Valve: The tricuspid valve is normal in structure. Tricuspid valve regurgitation is mild . No evidence of tricuspid stenosis. Aortic Valve: The aortic valve was not well visualized. There is moderate calcification of the aortic valve. Aortic valve regurgitation is moderate. Aortic regurgitation PHT measures 595 msec. Mild to moderate aortic valve sclerosis/calcification is present, without any evidence of aortic stenosis. Pulmonic Valve: The pulmonic valve was normal in structure. Pulmonic valve regurgitation is not visualized. No evidence of pulmonic stenosis. Aorta: The aortic root is normal in size and structure and aortic dilatation noted. There is moderate dilatation of  the ascending aorta, measuring 44 mm. Venous: The inferior vena cava is normal in size with greater than 50% respiratory variability, suggesting right atrial pressure of 3 mmHg. IAS/Shunts: No atrial level shunt detected by color flow Doppler.  LEFT VENTRICLE PLAX 2D LVIDd:         5.60 cm      Diastology LVIDs:         5.00 cm      LV e' lateral:   3.26 cm/s LV PW:         1.10 cm      LV E/e' lateral: 9.2 LV IVS:        1.10 cm LVOT diam:     2.00 cm LV SV:         50 LV SV Index:   23 LVOT Area:     3.14 cm  LV Volumes (MOD) LV vol d, MOD A4C: 187.0 ml LV vol s, MOD A4C: 142.0 ml LV SV MOD A4C:     187.0 ml RIGHT VENTRICLE             IVC RV S prime:  11.20 cm/s  IVC diam: 0.70 cm TAPSE (M-mode): 1.3 cm LEFT ATRIUM             Index       RIGHT ATRIUM           Index LA diam:        4.10 cm 1.86 cm/m  RA Area:     16.70 cm LA Vol (A2C):   64.7 ml 29.38 ml/m RA Volume:   41.30 ml  18.75 ml/m LA Vol (A4C):   76.7 ml 34.83 ml/m LA Biplane Vol: 71.6 ml 32.51 ml/m  AORTIC VALVE LVOT Vmax:   94.40 cm/s LVOT Vmean:  61.500 cm/s LVOT VTI:    0.160 m AI PHT:      595 msec  AORTA Ao Root diam: 3.80 cm Ao Asc diam:  4.35 cm MV E velocity: 30.00 cm/s MV A velocity: 82.60 cm/s  SHUNTS MV E/A ratio:  0.36        Systemic VTI:  0.16 m                            Systemic Diam: 2.00 cm Jenkins Rouge MD Electronically signed by Jenkins Rouge MD Signature Date/Time: 02/04/2021/3:15:48 PM    Final      Assessment and Plan:   1. Acute combined CHF:  Previous echo in Jan. 2020 shows EF 40-45%.   todays echo shows EF 25-30%.  Mod AI,   Given his fall in his EF,  I think he will need a R and L heart cath . I would like to wait until his CHF is stabilized prior to the cath . He has an S3 gallop on exam today so we may have hydrated him a bit too much.   He was on amlodipine at home  Will DC amlodipine and start CHF meds.  Start entresto.  Will need to see  if his renal function tolerates.  Cont coreg.   Add   Otherwise hydralazine/ Imdur   2.  Ventricular tachycardia:   EF 25-30% Occurred in the setting of hypokalemia - K wa 3.1 yesterday afternoon.   Was 3.6 today   3.  AKI:   Admitted with a creatinine of 2.06.  Has improved to 1.39 with hydration.   He has a loud S3 gallop so we may have overhydrated him a bit .   Following closely    Risk Assessment/Risk Scores:    For questions or updates, please contact [place] Please consult [email or web address] for contact info under    Signed, Mertie Moores, MD  02/04/2021 4:51 PM

## 2021-02-04 NOTE — Progress Notes (Signed)
Pt had 5 beats run of VT while eating.  Denies SOB or chest discomfort.  Idolina Primer, RN

## 2021-02-04 NOTE — Progress Notes (Signed)
  Echocardiogram 2D Echocardiogram has been performed.  Darius Rivers 02/04/2021, 2:41 PM

## 2021-02-04 NOTE — H&P (Signed)
History and Physical    Darius Rivers MWU:[id number] DOB: [birth year] DOA: 02/03/2021  PCP: Fortino Sic, PA Consultants:  [facility] - cardiology Patient coming from: Home - lives alone; NOK: Daughter, September White, [phone]  Chief Complaint:  Nausea  HPI: Darius Rivers is a 60 y.o. male with medical history significant of HTN; chronic combined CHF; HLD; NSVT; ascending thoracic aneurysm; stage 2 CKD; and prostate CA presenting with nausea, found to have AKI.  He had a similar admission in Jan 2020 with AKI and NSVT following vial gastroenteritis.  He reports that he ate a piece of strawberry shortcake and he got N/V/D.  Symptoms started the day after eating it.  Those symptoms lasted Saturday through Sunday - by the time he went to [facility] he was only feeling nausea, no more vomiting or diarrhea.  He feels ok.  He had a 35 beat run of vtach on the floor; he reports that he had hiccups and was holding his breath during that time.    ED Course:  [facility] to [facility] transfer, per Dr. Olevia Bowens:  Patient who came in nauseous to the ED in the setting of dehydration/diuretic and ACE inhibitor use with resulting AKI. He was hypotensive with mildly elevated down trending troponin (78 then 65), hypokalemia with prolonged QT on EKG. Improved with IV hydration. Accepted to telemetry bed for gentle IV hydration and echocardiogram. Cardiology and/or nephrology consult if needed.   Review of Systems: As per HPI; otherwise review of systems reviewed and negative.   Ambulatory Status:  Ambulates without assistance  COVID Vaccine Status:  None  Past Medical History:  Diagnosis Date  . Ascending aortic aneurysm ([place])   . CKD (chronic kidney disease), stage II   . Hyperlipidemia   . Hypertension   . Nephrolithiasis   . Prostate cancer [facility])    watchful waiting, due to go back to urology  . Systolic CHF [facility])     Past Surgical History:  Procedure Laterality Date  . CYSTOSCOPY WITH URETEROSCOPY,  STONE BASKETRY AND STENT PLACEMENT     "put a stent in; pulled stent out later"  . INGUINAL HERNIA REPAIR Bilateral 1980s  . PROSTATE BIOPSY      Social History   Socioeconomic History  . Marital status: Divorced    Spouse name: Not on file  . Number of children: Not on file  . Years of education: Not on file  . Highest education level: Not on file  Occupational History  . 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 Smoking status: Former Smoker    Packs/day: 0.33    Years: 20.00    Pack years: 6.60    Types: Cigarettes    Quit date: 11/11/1998    Years since quitting: 22.2  . Smokeless tobacco: Never Used  Vaping Use  . Vaping Use: Never used  Substance and Sexual Activity  . Alcohol use: Yes    Comment: 11/15/2018 "mixed drink q 2-3 weeks; if that"  . Drug use: Yes    Types: Marijuana    Comment: occasional use  . Sexual activity: Yes  Other Topics Concern  . Not on file  Social History Narrative  . Not on file   Social Determinants of Health   Financial Resource Strain: Not on file  Food Insecurity: Not on file  Transportation Needs: Not on file  Physical Activity: Not on file  Stress: Not  on file  Social Connections: Not on file  Intimate Partner Violence: Not on file    No Known Allergies  Family History  Problem Relation Age of Onset  . High blood pressure Mother   . Heart attack Neg Hx   . Cancer Neg Hx   . Diabetes Neg Hx     Prior to Admission medications   Medication Sig Start Date End Date Taking? Authorizing Provider  amLODipine (NORVASC) 10 MG tablet Take 10 mg by mouth daily.  11/04/18  Yes [provider]  aspirin EC 81 MG tablet Take 81 mg by mouth daily.   Yes [provider]  carvedilol (COREG) 6.25 MG tablet TAKE 3 TABLETS BY MOUTH 2 TIMES DAILY WITH A MEAL Patient taking differently: Take 18.75 mg by mouth 2 (two) times daily with a  meal. 06/14/19  Yes Barrett, Evelene Croon, PA-C  furosemide (LASIX) 40 MG tablet Take 40 mg by mouth daily.  09/15/16  Yes [provider]  isosorbide mononitrate (IMDUR) 30 MG 24 hr tablet TAKE 1 TABLET BY MOUTH DAILY Patient taking differently: Take 30 mg by mouth daily. 06/02/19  Yes Hilty, Nadean Corwin, MD  rosuvastatin (CRESTOR) 40 MG tablet Take 1 tablet (40 mg total) by mouth daily at 6 PM. Patient taking differently: Take 5 mg by mouth daily at 6 PM. 12/07/18  Yes Barrett, Evelene Croon, PA-C  docusate sodium (COLACE) 100 MG capsule Take 1 capsule (100 mg total) by mouth 2 (two) times daily as needed for mild constipation. 11/15/18   Desiree Hane, MD  lisinopril (PRINIVIL,ZESTRIL) 40 MG tablet HOLD UNTIL SEEN BY YOUR PCP 11/15/18   Desiree Hane, MD  promethazine (PHENERGAN) 25 MG tablet Take 1 tablet (25 mg total) by mouth every 6 (six) hours as needed for nausea or vomiting. 11/05/18   Lawyer, Harrell Gave, PA-C  Vitamin D, Ergocalciferol, (DRISDOL) 1.25 MG (50000 UT) CAPS capsule Take 50,000 Units by mouth every 7 (seven) days.  Patient not taking: Reported on 02/04/2021 10/05/17   [provider]    Physical Exam: Vitals:   02/04/21 0959 02/04/21 1000 02/04/21 1700 02/04/21 1735  BP: (!) 184/111  117/84 117/84  Pulse:  99 91   Resp:  18 18   Temp:  99 F (37.2 C) 98.8 F (37.1 C)   TempSrc:  Oral Oral   SpO2:      Weight: 87.6 kg     Height:         . General:  Appears calm and comfortable and is in NAD . Eyes:  PERRL, EOMI, normal lids, iris . ENT:  grossly normal hearing, lips & tongue, mmm; edentulous . Neck:  no LAD, masses or thyromegaly . Cardiovascular:  RRR, no m/r/g. No LE edema.  Marland Kitchen Respiratory:   CTA bilaterally with no wheezes/rales/rhonchi.  Normal respiratory effort. . Abdomen:  soft, NT, ND . Skin:  no rash or induration seen on limited exam . Musculoskeletal:  grossly normal tone BUE/BLE, good ROM, no bony abnormality . Lower extremity:  No LE edema.   Limited foot exam with no ulcerations.  2+ distal pulses. Marland Kitchen Psychiatric:  grossly normal mood and affect, speech fluent and appropriate, AOx3 . Neurologic:  CN 2-12 grossly intact, moves all extremities in coordinated fashion    Radiological Exams on Admission: Independently reviewed - see discussion in A/P where applicable  DG Chest 1 View  Result Date: 02/03/2021 CLINICAL DATA:  Nausea. EXAM: CHEST  1 VIEW COMPARISON:  November 10, 2018 FINDINGS: There is stable prominence of the mediastinum in the region of the ascending thoracic aorta secondary to the patient's known thoracic aortic aneurysm. The heart size and mediastinal contours are otherwise within normal limits. Both lungs are clear. The visualized skeletal structures are unremarkable. IMPRESSION: 1. Stable exam without evidence of active cardiopulmonary disease. Electronically Signed   By: Virgina Norfolk M.D.   On: 02/03/2021 17:17   ECHOCARDIOGRAM COMPLETE  Result Date: 02/04/2021    ECHOCARDIOGRAM REPORT   Patient Name:   GEVORG BRUM Date of Exam: 02/04/2021 Medical Rec #:  [id number]        Height:       76.5 in Accession #:    [id number]       Weight:       195.0 lb Date of Birth:  1961-04-04         BSA:          2.202 m Patient Age:    53 years         BP:           184/111 mmHg Patient Gender: M                HR:           82 bpm. Exam Location:  Inpatient Procedure: 2D Echo Indications:    ventricular tachycardia  History:        Patient has prior history of Echocardiogram examinations, most                 recent 11/12/2018. Chronic kidney disease; Risk                 Factors:Dyslipidemia.  Sonographer:    Johny Chess Referring Phys: [place]  1. Diffuse hypokinesis worse in the inferior base . Left ventricular ejection fraction, by estimation, is 25 to 30%. The left ventricle has severely decreased function. The left ventricle has no regional wall motion abnormalities. The left ventricular internal  cavity size was moderately dilated. There is mild left ventricular hypertrophy. Left ventricular diastolic parameters were normal.  2. Right ventricular systolic function is normal. The right ventricular size is normal.  3. Left atrial size was mildly dilated.  4. The mitral valve is normal in structure. Trivial mitral valve regurgitation. No evidence of mitral stenosis.  5. The aortic valve was not well visualized. There is moderate calcification of the aortic valve. Aortic valve regurgitation is moderate. Mild to moderate aortic valve sclerosis/calcification is present, without any evidence of aortic stenosis.  6. Aortic dilatation noted. There is moderate dilatation of the ascending aorta, measuring 44 mm.  7. The inferior vena cava is normal in size with greater than 50% respiratory variability, suggesting right atrial pressure of 3 mmHg. FINDINGS  Left Ventricle: Diffuse hypokinesis worse in the inferior base. Left ventricular ejection fraction, by estimation, is 25 to 30%. The left ventricle has severely decreased function. The left ventricle has no regional wall motion abnormalities. Global longitudinal strain performed but not reported based on interpreter judgement due to suboptimal tracking. 3D left ventricular ejection fraction analysis performed but not reported based on interpreter judgement due to suboptimal quality. The left ventricular internal cavity size was moderately dilated. There is mild left ventricular hypertrophy. Left ventricular diastolic parameters were normal. Right Ventricle: The right ventricular size is normal. No increase in right ventricular wall thickness. Right ventricular systolic function is normal. Left Atrium: Left atrial size was mildly dilated. Right Atrium: Right atrial  size was normal in size. Pericardium: There is no evidence of pericardial effusion. Mitral Valve: The mitral valve is normal in structure. Trivial mitral valve regurgitation. No evidence of mitral valve  stenosis. Tricuspid Valve: The tricuspid valve is normal in structure. Tricuspid valve regurgitation is mild . No evidence of tricuspid stenosis. Aortic Valve: The aortic valve was not well visualized. There is moderate calcification of the aortic valve. Aortic valve regurgitation is moderate. Aortic regurgitation PHT measures 595 msec. Mild to moderate aortic valve sclerosis/calcification is present, without any evidence of aortic stenosis. Pulmonic Valve: The pulmonic valve was normal in structure. Pulmonic valve regurgitation is not visualized. No evidence of pulmonic stenosis. Aorta: The aortic root is normal in size and structure and aortic dilatation noted. There is moderate dilatation of the ascending aorta, measuring 44 mm. Venous: The inferior vena cava is normal in size with greater than 50% respiratory variability, suggesting right atrial pressure of 3 mmHg. IAS/Shunts: No atrial level shunt detected by color flow Doppler.  LEFT VENTRICLE PLAX 2D LVIDd:         5.60 cm      Diastology LVIDs:         5.00 cm      LV e' lateral:   3.26 cm/s LV PW:         1.10 cm      LV E/e' lateral: 9.2 LV IVS:        1.10 cm LVOT diam:     2.00 cm LV SV:         50 LV SV Index:   23 LVOT Area:     3.14 cm  LV Volumes (MOD) LV vol d, MOD A4C: 187.0 ml LV vol s, MOD A4C: 142.0 ml LV SV MOD A4C:     187.0 ml RIGHT VENTRICLE             IVC RV S prime:     11.20 cm/s  IVC diam: 0.70 cm TAPSE (M-mode): 1.3 cm LEFT ATRIUM             Index       RIGHT ATRIUM           Index LA diam:        4.10 cm 1.86 cm/m  RA Area:     16.70 cm LA Vol (A2C):   64.7 ml 29.38 ml/m RA Volume:   41.30 ml  18.75 ml/m LA Vol (A4C):   76.7 ml 34.83 ml/m LA Biplane Vol: 71.6 ml 32.51 ml/m  AORTIC VALVE LVOT Vmax:   94.40 cm/s LVOT Vmean:  61.500 cm/s LVOT VTI:    0.160 m AI PHT:      595 msec  AORTA Ao Root diam: 3.80 cm Ao Asc diam:  4.35 cm MV E velocity: 30.00 cm/s MV A velocity: 82.60 cm/s  SHUNTS MV E/A ratio:  0.36        Systemic VTI:   0.16 m                            Systemic Diam: 2.00 cm Jenkins Rouge MD Electronically signed by Jenkins Rouge MD Signature Date/Time: 02/04/2021/3:15:48 PM    Final     EKG: Independently reviewed.  NSR with rate 69; LVH, IVCD; prolonged QTc 503; nonspecific ST changes with no evidence of acute ischemia   Labs on Admission: I have personally reviewed the available labs and imaging studies at the time of the  admission.  Pertinent labs:   Na++ 133 Glucose 113 BUN 25/Creatinine 1.39/GFR 59 - improved from 29/2.06/37 on 4/3; baseline creatinine 1.1-1.3 HS troponin 78, 65 Lactate 1.6 WBC 12.6 COVID/flu negative UA: moderate Hgb, few bacteria   Assessment/Plan Principal Problem:   AKI (acute kidney injury) ([place]) Active Problems:   Hyperlipidemia   Chronic combined systolic and diastolic CHF (congestive heart failure) (HCC)   CKD (chronic kidney disease), stage III (HCC)   NSVT (nonsustained ventricular tachycardia) (HCC)   Prolonged QT interval   Essential hypertension   AAA (abdominal aortic aneurysm) ([place])   H/O prostate cancer    AKI with baseline stage 3a CKD -Patient reports ?gastroenteritis over the weekend -Improved symptoms at this time  -Found to have AKI at [facility] and started on IVF -Creatinine has already essentially normalized and so this issue appears to be approaching baseline  NSVT -Patient with prolonged run of NSVT upon arrival from [facility] of 35 beats -This is concerning for worsening systolic heart failure -Will order echo -If echo stable, possible d/c today vs. Tomorrow -Low suspicion for ACS, troponin stable  Chronic combined CHF -11/2018 echo with EF 40-45% and grade 1 diastolic dysfunction -Due to NSVT (as above), echo was repeated -Dr. Acie Fredrickson called to report that he has a significantly worse EF since prior, now 25-30% -Hold aggressive IVF -He will be started on CHF meds -RHC/LHC scheduled for Wednesday -Continue Imdur  HTN -Stop Norvasc -Continue  Coreg but dose increased by cardiology -He has been started on Entresto by cardiology - will need close monitoring of his renal function  HLD -Continue Crestor  AAA -2017 CTA showed AAA 4.4 cm -Appears to need f/u imaging -CXR today with "stable prominence of the mediastinum" in this region  H/o prostate CA -Needs close outpatient f/u - he reports he is due in May although this is not reflected in Epic  Prolonged QTc -Likely associated with dehydration, anticipate resolution once volume status is normalized -Will attempt to avoid QT-prolonging medications such as PPI, nausea meds, SSRIs -Repeat EKG in AM    Note: This patient has been tested and is negative for the novel coronavirus COVID-19. The patient has NOT been vaccinated against COVID-19.   Level of care: Telemetry Cardiac DVT prophylaxis:  Lovenox Code Status:  Full - confirmed with patient Family Communication: None present Disposition Plan:  The patient is from: home  Anticipated d/c is to: home without [facility] services   Anticipated d/c date will depend on clinical response to treatment, currently planned for after cardiac catheterization on 4/6  Patient is currently: acutely ill Consults called: Cardiology Admission status:  Admit - It is my clinical opinion that admission to [place] is reasonable and necessary because of the expectation that this patient will require hospital care that crosses at least 2 midnights to treat this condition based on the medical complexity of the problems presented.  Given the aforementioned information, the predictability of an adverse outcome is felt to be significant.    Karmen Bongo MD [facility]   How to contact the [facility] Attending or Consulting provider [place] or covering provider during after hours [place], for this patient?  1. Check the care team in [facility] and look for a) attending/consulting TR[facility] provider listed and b) the [facility] team listed 2. Log into [email or web address] and  use [place]'s universal password to access. If you do not have the password, please contact the hospital operator. 3. Locate the [facility] provider you are looking for  under [facility] and page to a number that you can be directly reached. 4. If you still have difficulty reaching the provider, please page the [facility] (Director on Call) for the Hospitalists listed on amion for assistance.   02/04/2021, 6:48 PM

## 2021-02-05 DIAGNOSIS — N179 Acute kidney failure, unspecified: Secondary | ICD-10-CM | POA: Diagnosis not present

## 2021-02-05 LAB — BASIC METABOLIC PANEL
Anion gap: 8 (ref 5–15)
BUN: 17 mg/dL (ref 6–20)
CO2: 24 mmol/L (ref 22–32)
Calcium: 9 mg/dL (ref 8.9–10.3)
Chloride: 101 mmol/L (ref 98–111)
Creatinine, Ser: 1.31 mg/dL — ABNORMAL HIGH (ref 0.61–1.24)
GFR, Estimated: >60 mL/min (ref >60)
Glucose, Bld: 104 mg/dL — ABNORMAL HIGH (ref 70–99)
Potassium: 3.3 mmol/L — ABNORMAL LOW (ref 3.5–5.1)
Sodium: 133 mmol/L — ABNORMAL LOW (ref 135–145)

## 2021-02-05 LAB — CBC
HCT: 38.8 % — ABNORMAL LOW (ref 39.0–52.0)
Hemoglobin: 13.7 g/dL (ref 13.0–17.0)
MCH: 34.4 pg — ABNORMAL HIGH (ref 26.0–34.0)
MCHC: 35.3 g/dL (ref 30.0–36.0)
MCV: 97.5 fL (ref 80.0–100.0)
Platelets: 339 10*3/uL (ref 150–400)
RBC: 3.98 MIL/uL — ABNORMAL LOW (ref 4.22–5.81)
RDW: 13.5 % (ref 11.5–15.5)
WBC: 9.9 10*3/uL (ref 4.0–10.5)
nRBC: 0 % (ref 0.0–0.2)

## 2021-02-05 MED ORDER — SODIUM CHLORIDE 0.9 % IV SOLN: INTRAVENOUS | Status: DC

## 2021-02-05 MED ORDER — SODIUM CHLORIDE 0.9% FLUSH
3.0000 mL | Freq: Two times a day (BID) | INTRAVENOUS | Status: DC
  Administered 2021-02-05: 3 mL via INTRAVENOUS

## 2021-02-05 MED ORDER — CARVEDILOL 12.5 MG PO TABS
12.5000 mg | ORAL_TABLET | Freq: Two times a day (BID) | ORAL | Status: DC
  Administered 2021-02-05 – 2021-02-07 (×4): 12.5 mg via ORAL
  Filled 2021-02-05 (×4): qty 1

## 2021-02-05 MED ORDER — SODIUM CHLORIDE 0.9% FLUSH: 3.0000 mL | INTRAVENOUS | Status: DC | PRN

## 2021-02-05 MED ORDER — SODIUM CHLORIDE 0.9 % IV SOLN: 250.0000 mL | INTRAVENOUS | Status: DC | PRN

## 2021-02-05 MED ORDER — ASPIRIN 81 MG PO CHEW
81.0000 mg | CHEWABLE_TABLET | ORAL | Status: AC
  Administered 2021-02-06: 81 mg via ORAL
  Filled 2021-02-05: qty 1

## 2021-02-05 MED ORDER — POTASSIUM CHLORIDE CRYS ER 20 MEQ PO TBCR
40.0000 meq | EXTENDED_RELEASE_TABLET | Freq: Two times a day (BID) | ORAL | Status: AC
  Administered 2021-02-05 (×2): 40 meq via ORAL
  Filled 2021-02-05 (×2): qty 2

## 2021-02-05 MED ORDER — ENSURE ENLIVE PO LIQD
237.0000 mL | Freq: Two times a day (BID) | ORAL | Status: DC
  Administered 2021-02-05 – 2021-02-07 (×4): 237 mL via ORAL

## 2021-02-05 MED ORDER — HYDRALAZINE HCL 10 MG PO TABS
10.0000 mg | ORAL_TABLET | Freq: Three times a day (TID) | ORAL | Status: DC
  Administered 2021-02-05 – 2021-02-07 (×6): 10 mg via ORAL
  Filled 2021-02-05 (×6): qty 1

## 2021-02-05 MED ORDER — SPIRONOLACTONE 12.5 MG HALF TABLET
12.5000 mg | ORAL_TABLET | Freq: Every day | ORAL | Status: DC
  Administered 2021-02-05 – 2021-02-06 (×2): 12.5 mg via ORAL
  Filled 2021-02-05 (×2): qty 1

## 2021-02-05 NOTE — Progress Notes (Signed)
PROGRESS NOTE    Darius Rivers  LKG:[id number] DOB: 03/31/1961 DOA: 02/03/2021 PCP: Fortino Sic, PA    Brief Narrative:  60 year old gentleman with history of hypertension, chronic combined congestive heart failure, hyperlipidemia, history of NSVT, chronic kidney disease stage II, prostate cancer presented to the emergency room with episode of nausea and vomiting after eating a strawberry shortcake at home.  He had continued symptoms for about 2 days so came to the ER.  Denies any chest pain or palpitations.  In the emergency room, he was with low blood pressures, mild elevated troponins, hypokalemia.  Found to have AKI.  Admitted to hospital with cardiology consultation.   Assessment & Plan:   Principal Problem:   AKI (acute kidney injury) ([place]) Active Problems:   Hyperlipidemia   Chronic combined systolic and diastolic CHF (congestive heart failure) (HCC)   CKD (chronic kidney disease), stage III (HCC)   NSVT (nonsustained ventricular tachycardia) (HCC)   Prolonged QT interval   Essential hypertension   AAA (abdominal aortic aneurysm) ([place])   H/O prostate cancer  Acute on chronic combined congestive heart failure: Echocardiogram 1/20 with ejection fraction 45% and grade 1 diastolic dysfunction. Echocardiogram 4/4 with reduced ejection fraction 25 to 30%.  IV fluid discontinued. Symptomatically improving.  Euvolemic. Patient started on Entresto, Aldactone, carvedilol.  He is already on aspirin.  With decreased ejection fraction and decompensation, advised cardiac catheterization.  Plan for left heart cath tomorrow.  Followed by cardiology.  Acute kidney injury with history of chronic kidney disease stage II: Baseline creatinine about 1.34-1.5 historically.  Presented with dehydration.  Treated with IV fluid and trended back to his baseline.  Creatinine is 1.3 today.  IV fluid discontinued with acute heart failure.  Started back on [place] today.  NSVT: Has prolonged run  of NSVT upon arrival at the emergency room.  Does have history of NSVT.  Currently stable.  On beta-blockers.  Hypertension: Stable on Entresto and carvedilol.  Hyperlipidemia: On Crestor.  Hypokalemia: Replace aggressively today.  Recheck level tomorrow morning.     DVT prophylaxis: enoxaparin (LOVENOX) injection 40 mg Start: 02/04/21 1130   Code Status: Full code Family Communication: None at the bedside. Disposition Plan: Status is: Inpatient  Remains inpatient appropriate because:IV treatments appropriate due to intensity of illness or inability to take PO and Inpatient level of care appropriate due to severity of illness   Dispo: The patient is from: Home              Anticipated d/c is to: Home              Patient currently is not medically stable to d/c.   Difficult to place patient No         Consultants:   Cardiology  Procedures:   None  Antimicrobials:   None   Subjective: Patient seen and examined.  No overnight events.  He denies any more nausea or vomiting.  He thinks his symptoms are better.  No chest pain or shortness of breath.  Objective: Vitals:   02/04/21 2142 02/04/21 2357 02/05/21 0445 02/05/21 0840  BP: (!) 166/104 (!) 138/103 (!) 171/107 (!) 162/100  Pulse: 73 82 76   Resp: 17 16 17 19   Temp: 98.3 F (36.8 C) 98.8 F (37.1 C) 99.3 F (37.4 C)   TempSrc: Oral Oral Oral   SpO2: 99% 98% 100%   Weight:   82.3 kg   Height:   6\' 4"  (1.93 m)     Intake/Output  Summary (Last 24 hours) at 02/05/2021 1150 Last data filed at 02/05/2021 0800 Gross per 24 hour  Intake 1806.04 ml  Output 925 ml  Net 881.04 ml   Filed Weights   02/03/21 1559 02/04/21 0959 02/05/21 0445  Weight: 88.5 kg 87.6 kg 82.3 kg    Examination:  General exam: Appears calm and comfortable  Respiratory system: Clear to auscultation. Respiratory effort normal. Cardiovascular system: S1 & S2 heard, RRR. No JVD, murmurs, rubs, gallops or clicks. No pedal  edema. Gastrointestinal system: Abdomen is nondistended, soft and nontender. No organomegaly or masses felt. Normal bowel sounds heard. Central nervous system: Alert and oriented. No focal neurological deficits. Extremities: Symmetric 5 x 5 power. Skin: No rashes, lesions or ulcers Psychiatry: Judgement and insight appear normal. Mood & affect appropriate.     Data Reviewed: I have personally reviewed following labs and imaging studies  CBC: Recent Labs  Lab 02/03/21 1600 02/04/21 0633 02/05/21 0346  WBC 12.1* 12.6* 9.9  NEUTROABS 8.2*  --   --   HGB 14.5 13.7 13.7  HCT 41.4 38.6* 38.8*  MCV 96.3 95.8 97.5  PLT 380 341 376   Basic Metabolic Panel: Recent Labs  Lab 02/03/21 1600 02/04/21 0633 02/05/21 0346  NA 132* 133* 133*  K 3.1* 3.6 3.3*  CL 97* 101 101  CO2 25 25 24   GLUCOSE 154* 113* 104*  BUN 29* 25* 17  CREATININE 2.06* 1.39* 1.31*  CALCIUM 9.0 8.8* 9.0  MG 2.9*  --   --    GFR: Estimated Creatinine Clearance: 72.4 mL/min (A) (by C-G formula based on SCr of 1.31 mg/dL (H)). Liver Function Tests: Recent Labs  Lab 02/03/21 1600  AST 19  ALT 13  ALKPHOS 51  BILITOT 0.7  PROT 7.7  ALBUMIN 4.0   Recent Labs  Lab 02/03/21 1600  LIPASE 32   No results for input(s): AMMONIA in the last 168 hours. Coagulation Profile: No results for input(s): INR, PROTIME in the last 168 hours. Cardiac Enzymes: No results for input(s): CKTOTAL, CKMB, CKMBINDEX, TROPONINI in the last 168 hours. BNP (last 3 results) No results for input(s): PROBNP in the last 8760 hours. HbA1C: No results for input(s): HGBA1C in the last 72 hours. CBG: No results for input(s): GLUCAP in the last 168 hours. Lipid Profile: No results for input(s): CHOL, HDL, LDLCALC, TRIG, CHOLHDL, LDLDIRECT in the last 72 hours. Thyroid Function Tests: Recent Labs    02/04/21 1141  TSH 0.637   Anemia Panel: No results for input(s): VITAMINB12, FOLATE, FERRITIN, TIBC, IRON, RETICCTPCT in the  last 72 hours. Sepsis Labs: Recent Labs  Lab 02/03/21 1825  LATICACIDVEN 1.6    Recent Results (from the past 240 hour(s))  Resp Panel by RT-PCR (Flu A&B, Covid) Nasopharyngeal Swab     Status: None   Collection Time: 02/03/21  5:08 PM   Specimen: Nasopharyngeal Swab; Nasopharyngeal(NP) swabs in vial transport medium  Result Value Ref Range Status   SARS Coronavirus 2 by RT PCR NEGATIVE NEGATIVE Final    Comment: (NOTE) SARS-CoV-2 target nucleic acids are NOT DETECTED.  The SARS-CoV-2 RNA is generally detectable in upper respiratory specimens during the acute phase of infection. The lowest concentration of SARS-CoV-2 viral copies this assay can detect is 138 copies/mL. A negative result does not preclude SARS-Cov-2 infection and should not be used as the sole basis for treatment or other patient management decisions. A negative result may occur with  improper specimen collection/handling, submission of specimen other than nasopharyngeal  swab, presence of viral mutation(s) within the areas targeted by this assay, and inadequate number of viral copies(<138 copies/mL). A negative result must be combined with clinical observations, patient history, and epidemiological information. The expected result is Negative.  Fact Sheet for Patients:  EntrepreneurPulse.com.au  Fact Sheet for Healthcare Providers:  IncredibleEmployment.be  This test is no t yet approved or cleared by the [place] FDA and  has been authorized for detection and/or diagnosis of SARS-CoV-2 by FDA under an Emergency Use Authorization (EUA). This EUA will remain  in effect (meaning this test can be used) for the duration of the COVID-19 declaration under Section 564(b)(1) of the Act, 21 U.S.C.section 360bbb-3(b)(1), unless the authorization is terminated  or revoked sooner.       Influenza A by PCR NEGATIVE NEGATIVE Final   Influenza B by PCR NEGATIVE NEGATIVE Final     Comment: (NOTE) The Xpert Xpress SARS-CoV-2/FLU/RSV plus assay is intended as an aid in the diagnosis of influenza from Nasopharyngeal swab specimens and should not be used as a sole basis for treatment. Nasal washings and aspirates are unacceptable for Xpert Xpress SARS-CoV-2/FLU/RSV testing.  Fact Sheet for Patients: EntrepreneurPulse.com.au  Fact Sheet for Healthcare Providers: IncredibleEmployment.be  This test is not yet approved or cleared by the [place] FDA and has been authorized for detection and/or diagnosis of SARS-CoV-2 by FDA under an Emergency Use Authorization (EUA). This EUA will remain in effect (meaning this test can be used) for the duration of the COVID-19 declaration under Section 564(b)(1) of the Act, 21 U.S.C. section 360bbb-3(b)(1), unless the authorization is terminated or revoked.  Performed at [facility], [address], [place], [place] [address]          Radiology Studies: DG Chest 1 View  Result Date: 02/03/2021 CLINICAL DATA:  Nausea. EXAM: CHEST  1 VIEW COMPARISON:  November 10, 2018 FINDINGS: There is stable prominence of the mediastinum in the region of the ascending thoracic aorta secondary to the patient's known thoracic aortic aneurysm. The heart size and mediastinal contours are otherwise within normal limits. Both lungs are clear. The visualized skeletal structures are unremarkable. IMPRESSION: 1. Stable exam without evidence of active cardiopulmonary disease. Electronically Signed   By: Virgina Norfolk M.D.   On: 02/03/2021 17:17   ECHOCARDIOGRAM COMPLETE  Result Date: 02/04/2021    ECHOCARDIOGRAM REPORT   Patient Name:   KIRT CHEW Date of Exam: 02/04/2021 Medical Rec #:  [id number]        Height:       76.5 in Accession #:    [id number]       Weight:       195.0 lb Date of Birth:  [birth year]         BSA:          2.202 m Patient Age:    50 years         BP:           184/111 mmHg  Patient Gender: M                HR:           82 bpm. Exam Location:  Inpatient Procedure: 2D Echo Indications:    ventricular tachycardia  History:        Patient has prior history of Echocardiogram examinations, most                 recent 11/12/2018. Chronic kidney disease; Risk  Factors:Dyslipidemia.  Sonographer:    Johny Chess Referring Phys: [place]  1. Diffuse hypokinesis worse in the inferior base . Left ventricular ejection fraction, by estimation, is 25 to 30%. The left ventricle has severely decreased function. The left ventricle has no regional wall motion abnormalities. The left ventricular internal cavity size was moderately dilated. There is mild left ventricular hypertrophy. Left ventricular diastolic parameters were normal.  2. Right ventricular systolic function is normal. The right ventricular size is normal.  3. Left atrial size was mildly dilated.  4. The mitral valve is normal in structure. Trivial mitral valve regurgitation. No evidence of mitral stenosis.  5. The aortic valve was not well visualized. There is moderate calcification of the aortic valve. Aortic valve regurgitation is moderate. Mild to moderate aortic valve sclerosis/calcification is present, without any evidence of aortic stenosis.  6. Aortic dilatation noted. There is moderate dilatation of the ascending aorta, measuring 44 mm.  7. The inferior vena cava is normal in size with greater than 50% respiratory variability, suggesting right atrial pressure of 3 mmHg. FINDINGS  Left Ventricle: Diffuse hypokinesis worse in the inferior base. Left ventricular ejection fraction, by estimation, is 25 to 30%. The left ventricle has severely decreased function. The left ventricle has no regional wall motion abnormalities. Global longitudinal strain performed but not reported based on interpreter judgement due to suboptimal tracking. 3D left ventricular ejection fraction analysis performed but  not reported based on interpreter judgement due to suboptimal quality. The left ventricular internal cavity size was moderately dilated. There is mild left ventricular hypertrophy. Left ventricular diastolic parameters were normal. Right Ventricle: The right ventricular size is normal. No increase in right ventricular wall thickness. Right ventricular systolic function is normal. Left Atrium: Left atrial size was mildly dilated. Right Atrium: Right atrial size was normal in size. Pericardium: There is no evidence of pericardial effusion. Mitral Valve: The mitral valve is normal in structure. Trivial mitral valve regurgitation. No evidence of mitral valve stenosis. Tricuspid Valve: The tricuspid valve is normal in structure. Tricuspid valve regurgitation is mild . No evidence of tricuspid stenosis. Aortic Valve: The aortic valve was not well visualized. There is moderate calcification of the aortic valve. Aortic valve regurgitation is moderate. Aortic regurgitation PHT measures 595 msec. Mild to moderate aortic valve sclerosis/calcification is present, without any evidence of aortic stenosis. Pulmonic Valve: The pulmonic valve was normal in structure. Pulmonic valve regurgitation is not visualized. No evidence of pulmonic stenosis. Aorta: The aortic root is normal in size and structure and aortic dilatation noted. There is moderate dilatation of the ascending aorta, measuring 44 mm. Venous: The inferior vena cava is normal in size with greater than 50% respiratory variability, suggesting right atrial pressure of 3 mmHg. IAS/Shunts: No atrial level shunt detected by color flow Doppler.  LEFT VENTRICLE PLAX 2D LVIDd:         5.60 cm      Diastology LVIDs:         5.00 cm      LV e' lateral:   3.26 cm/s LV PW:         1.10 cm      LV E/e' lateral: 9.2 LV IVS:        1.10 cm LVOT diam:     2.00 cm LV SV:         50 LV SV Index:   23 LVOT Area:     3.14 cm  LV Volumes (MOD) LV vol d, MOD  A4C: 187.0 ml LV vol s, MOD A4C:  142.0 ml LV SV MOD A4C:     187.0 ml RIGHT VENTRICLE             IVC RV S prime:     11.20 cm/s  IVC diam: 0.70 cm TAPSE (M-mode): 1.3 cm LEFT ATRIUM             Index       RIGHT ATRIUM           Index LA diam:        4.10 cm 1.86 cm/m  RA Area:     16.70 cm LA Vol (A2C):   64.7 ml 29.38 ml/m RA Volume:   41.30 ml  18.75 ml/m LA Vol (A4C):   76.7 ml 34.83 ml/m LA Biplane Vol: 71.6 ml 32.51 ml/m  AORTIC VALVE LVOT Vmax:   94.40 cm/s LVOT Vmean:  61.500 cm/s LVOT VTI:    0.160 m AI PHT:      595 msec  AORTA Ao Root diam: 3.80 cm Ao Asc diam:  4.35 cm MV E velocity: 30.00 cm/s MV A velocity: 82.60 cm/s  SHUNTS MV E/A ratio:  0.36        Systemic VTI:  0.16 m                            Systemic Diam: 2.00 cm Jenkins Rouge MD Electronically signed by Jenkins Rouge MD Signature Date/Time: 02/04/2021/3:15:48 PM    Final         Scheduled Meds: . aspirin EC  81 mg Oral Daily  . carvedilol  12.5 mg Oral BID WC  . docusate sodium  100 mg Oral BID  . enoxaparin (LOVENOX) injection  40 mg Subcutaneous Daily  . hydrALAZINE  10 mg Oral Q8H  . isosorbide mononitrate  30 mg Oral Daily  . potassium chloride  40 mEq Oral BID  . rosuvastatin  40 mg Oral q1800  . sacubitril-valsartan  1 tablet Oral BID  . sodium chloride flush  3 mL Intravenous Q12H  . spironolactone  12.5 mg Oral Daily   Continuous Infusions: . sodium chloride Stopped (02/03/21 1918)     LOS: 1 day    Time spent: 32 minutes    Barb Merino, MD [facility] Pager [phone]

## 2021-02-05 NOTE — Care Management (Signed)
02-05-21 Benefits check submitted for Entresto. Case Manager will follow for cost. Graves-Bigelow, Ocie Cornfield, RN,BSN Case Manager

## 2021-02-05 NOTE — Progress Notes (Signed)
Initial Nutrition Assessment  DOCUMENTATION CODES:   Not applicable  INTERVENTION:  Provide Ensure Enlive po BID, each supplement provides 350 kcal and 20 grams of protein  Encourage adequate PO intake.   NUTRITION DIAGNOSIS:   Increased nutrient needs related to chronic illness (CHF) as evidenced by estimated needs.  GOAL:   Patient will meet greater than or equal to 90% of their needs  MONITOR:   PO intake,Supplement acceptance,Skin,Weight trends,Labs,I & O's  REASON FOR ASSESSMENT:   Malnutrition Screening Tool    ASSESSMENT:   60 year old gentleman with history of hypertension, chronic combined congestive heart failure, hyperlipidemia, history of NSVT, chronic kidney disease stage II, prostate cancer presents with 2 day history of n/v. Pt presents with low blood pressures, mild elevated troponins, hypokalemia, AKI, and acute on chronic CHF.  Plan for L heart cath tomorrow. Meal completion has been 50-100%. Pt reports having a good appetite currently. Pt has been tolerating his PO. Pt reports prior to onset of n/v 2 days prior to admission, he was eating well with no difficulties. Usual body weight reported to be ~190-195 lbs. Noted pt with history of CHF, weight fluctuation likely related to fluid. RD to order nutritional supplements to aid in caloric and protein needs.   NUTRITION - FOCUSED PHYSICAL EXAM:  Flowsheet Row Most Recent Value  Orbital Region Unable to assess  Upper Arm Region No depletion  Thoracic and Lumbar Region No depletion  Buccal Region Unable to assess  Temple Region Unable to assess  Clavicle Bone Region Moderate depletion  Clavicle and Acromion Bone Region Moderate depletion  Scapular Bone Region Unable to assess  Dorsal Hand Unable to assess  Patellar Region Mild depletion  Anterior Thigh Region No depletion  Posterior Calf Region Mild depletion  Edema (RD Assessment) None  Hair Reviewed  Eyes Reviewed  Mouth Reviewed  Skin Reviewed   Nails Reviewed     Labs and medications reviewed.   Diet Order:   Diet Order            Diet NPO time specified Except for: Sips with Meds  Diet effective midnight           Diet Heart Room service appropriate? Yes; Fluid consistency: Thin  Diet effective now                 EDUCATION NEEDS:   Not appropriate for education at this time  Skin:  Skin Assessment: Reviewed RN Assessment  Last BM:  4/4  Height:   Ht Readings from Last 1 Encounters:  02/05/21 6\' 4"  (1.93 m)    Weight:   Wt Readings from Last 1 Encounters:  02/05/21 82.3 kg   BMI:  Body mass index is 22.09 kg/m.  Estimated Nutritional Needs:   Kcal:  2200-2400  Protein:  110-120 grams  Fluid:  2 L/day  Corrin Parker, MS, RD, LDN RD pager number/after hours weekend pager number on Amion.

## 2021-02-05 NOTE — H&P (View-Only) (Signed)
Progress Note  Patient Name: Darius Rivers Date of Encounter: 02/05/2021  [facility] Cardiologist: Pixie Casino, MD    Subjective   60 year old gentleman with history of hypertension, hyperlipidemia, congestive heart failure. He was admitted with episodes of palpitations and nausea.  He is found to have acute renal failure which has improved with IV fluids. He is also been found to have episodes of nonsustained ventricular tachycardia as well as acute deterioration in his LV function.  He is scheduled for heart catheterization tomorrow for further evaluation of his acute deterioration in his left ventricular function.  Still having rare episodes of nonsustained ventricular tachycardia.  He had 2 separate episodes of 5 beat runs of nonsustained ventricular tachycardia overnight.  He is breathing well.  He is able to lie flat without any difficulty.  Inpatient Medications    Scheduled Meds: . aspirin EC  81 mg Oral Daily  . carvedilol  6.25 mg Oral BID WC  . docusate sodium  100 mg Oral BID  . enoxaparin (LOVENOX) injection  40 mg Subcutaneous Daily  . isosorbide mononitrate  30 mg Oral Daily  . potassium chloride  40 mEq Oral BID  . rosuvastatin  40 mg Oral q1800  . sacubitril-valsartan  1 tablet Oral BID  . sodium chloride flush  3 mL Intravenous Q12H   Continuous Infusions: . sodium chloride Stopped (02/03/21 1918)   PRN Meds: acetaminophen **OR** acetaminophen, bisacodyl, hydrALAZINE, HYDROcodone-acetaminophen, labetalol, morphine injection, polyethylene glycol, prochlorperazine   Vital Signs    Vitals:   02/04/21 2142 02/04/21 2357 02/05/21 0445 02/05/21 0840  BP: (!) 166/104 (!) 138/103 (!) 171/107 (!) 162/100  Pulse: 73 82 76   Resp: 17 16 17 19   Temp: 98.3 F (36.8 C) 98.8 F (37.1 C) 99.3 F (37.4 C)   TempSrc: Oral Oral Oral   SpO2: 99% 98% 100%   Weight:   82.3 kg   Height:   6\' 4"  (1.93 m)     Intake/Output Summary (Last 24 hours) at  02/05/2021 1050 Last data filed at 02/05/2021 0800 Gross per 24 hour  Intake 1806.04 ml  Output 925 ml  Net 881.04 ml   Last 3 Weights 02/05/2021 02/04/2021 02/03/2021  Weight (lbs) 181 lb 8 oz 193 lb 3.2 oz 195 lb  Weight (kg) 82.328 kg 87.635 kg 88.451 kg      Telemetry    Normal sinus rhythm.  He had 2 episodes of nonsustained ventricular tachycardia lasting 5 beats each.- Personally Reviewed  ECG     - Personally Reviewed  Physical Exam   GEN: No acute distress.  Middle-aged gentleman, no acute distress Neck: No JVD Cardiac: RRR, no murmurs, rubs, or gallops.  Respiratory: Clear to auscultation bilaterally. GI: Soft, nontender, non-distended  MS: No edema; No deformity. Neuro:  Nonfocal  Psych: Normal affect   Labs    High Sensitivity Troponin:   Recent Labs  Lab 02/03/21 1600 02/03/21 1825  TROPONINIHS 78* 65*      Chemistry Recent Labs  Lab 02/03/21 1600 02/04/21 0633 02/05/21 0346  NA 132* 133* 133*  K 3.1* 3.6 3.3*  CL 97* 101 101  CO2 25 25 24   GLUCOSE 154* 113* 104*  BUN 29* 25* 17  CREATININE 2.06* 1.39* 1.31*  CALCIUM 9.0 8.8* 9.0  PROT 7.7  --   --   ALBUMIN 4.0  --   --   AST 19  --   --   ALT 13  --   --  ALKPHOS 51  --   --   BILITOT 0.7  --   --   GFRNONAA 37* 59* >60  ANIONGAP 10 7 8      Hematology Recent Labs  Lab 02/03/21 1600 02/04/21 0633 02/05/21 0346  WBC 12.1* 12.6* 9.9  RBC 4.30 4.03* 3.98*  HGB 14.5 13.7 13.7  HCT 41.4 38.6* 38.8*  MCV 96.3 95.8 97.5  MCH 33.7 34.0 34.4*  MCHC 35.0 35.5 35.3  RDW 13.9 13.7 13.5  PLT 380 341 339    BNP Recent Labs  Lab 02/03/21 1600  BNP 107.4*     DDimer No results for input(s): DDIMER in the last 168 hours.   Radiology    DG Chest 1 View  Result Date: 02/03/2021 CLINICAL DATA:  Nausea. EXAM: CHEST  1 VIEW COMPARISON:  November 10, 2018 FINDINGS: There is stable prominence of the mediastinum in the region of the ascending thoracic aorta secondary to the patient's known  thoracic aortic aneurysm. The heart size and mediastinal contours are otherwise within normal limits. Both lungs are clear. The visualized skeletal structures are unremarkable. IMPRESSION: 1. Stable exam without evidence of active cardiopulmonary disease. Electronically Signed   By: Virgina Norfolk M.D.   On: 02/03/2021 17:17   ECHOCARDIOGRAM COMPLETE  Result Date: 02/04/2021    ECHOCARDIOGRAM REPORT   Patient Name:   Darius Rivers Date of Exam: 02/04/2021 Medical Rec #:  [id number]        Height:       76.5 in Accession #:    [id number]       Weight:       195.0 lb Date of Birth:  10-01-1961         BSA:          2.202 m Patient Age:    71 years         BP:           184/111 mmHg Patient Gender: M                HR:           82 bpm. Exam Location:  Inpatient Procedure: 2D Echo Indications:    ventricular tachycardia  History:        Patient has prior history of Echocardiogram examinations, most                 recent 11/12/2018. Chronic kidney disease; Risk                 Factors:Dyslipidemia.  Sonographer:    Johny Chess Referring Phys: [place]  1. Diffuse hypokinesis worse in the inferior base . Left ventricular ejection fraction, by estimation, is 25 to 30%. The left ventricle has severely decreased function. The left ventricle has no regional wall motion abnormalities. The left ventricular internal cavity size was moderately dilated. There is mild left ventricular hypertrophy. Left ventricular diastolic parameters were normal.  2. Right ventricular systolic function is normal. The right ventricular size is normal.  3. Left atrial size was mildly dilated.  4. The mitral valve is normal in structure. Trivial mitral valve regurgitation. No evidence of mitral stenosis.  5. The aortic valve was not well visualized. There is moderate calcification of the aortic valve. Aortic valve regurgitation is moderate. Mild to moderate aortic valve sclerosis/calcification is present, without  any evidence of aortic stenosis.  6. Aortic dilatation noted. There is moderate dilatation of the ascending aorta, measuring 44 mm.  7. The inferior  vena cava is normal in size with greater than 50% respiratory variability, suggesting right atrial pressure of 3 mmHg. FINDINGS  Left Ventricle: Diffuse hypokinesis worse in the inferior base. Left ventricular ejection fraction, by estimation, is 25 to 30%. The left ventricle has severely decreased function. The left ventricle has no regional wall motion abnormalities. Global longitudinal strain performed but not reported based on interpreter judgement due to suboptimal tracking. 3D left ventricular ejection fraction analysis performed but not reported based on interpreter judgement due to suboptimal quality. The left ventricular internal cavity size was moderately dilated. There is mild left ventricular hypertrophy. Left ventricular diastolic parameters were normal. Right Ventricle: The right ventricular size is normal. No increase in right ventricular wall thickness. Right ventricular systolic function is normal. Left Atrium: Left atrial size was mildly dilated. Right Atrium: Right atrial size was normal in size. Pericardium: There is no evidence of pericardial effusion. Mitral Valve: The mitral valve is normal in structure. Trivial mitral valve regurgitation. No evidence of mitral valve stenosis. Tricuspid Valve: The tricuspid valve is normal in structure. Tricuspid valve regurgitation is mild . No evidence of tricuspid stenosis. Aortic Valve: The aortic valve was not well visualized. There is moderate calcification of the aortic valve. Aortic valve regurgitation is moderate. Aortic regurgitation PHT measures 595 msec. Mild to moderate aortic valve sclerosis/calcification is present, without any evidence of aortic stenosis. Pulmonic Valve: The pulmonic valve was normal in structure. Pulmonic valve regurgitation is not visualized. No evidence of pulmonic stenosis.  Aorta: The aortic root is normal in size and structure and aortic dilatation noted. There is moderate dilatation of the ascending aorta, measuring 44 mm. Venous: The inferior vena cava is normal in size with greater than 50% respiratory variability, suggesting right atrial pressure of 3 mmHg. IAS/Shunts: No atrial level shunt detected by color flow Doppler.  LEFT VENTRICLE PLAX 2D LVIDd:         5.60 cm      Diastology LVIDs:         5.00 cm      LV e' lateral:   3.26 cm/s LV PW:         1.10 cm      LV E/e' lateral: 9.2 LV IVS:        1.10 cm LVOT diam:     2.00 cm LV SV:         50 LV SV Index:   23 LVOT Area:     3.14 cm  LV Volumes (MOD) LV vol d, MOD A4C: 187.0 ml LV vol s, MOD A4C: 142.0 ml LV SV MOD A4C:     187.0 ml RIGHT VENTRICLE             IVC RV S prime:     11.20 cm/s  IVC diam: 0.70 cm TAPSE (M-mode): 1.3 cm LEFT ATRIUM             Index       RIGHT ATRIUM           Index LA diam:        4.10 cm 1.86 cm/m  RA Area:     16.70 cm LA Vol (A2C):   64.7 ml 29.38 ml/m RA Volume:   41.30 ml  18.75 ml/m LA Vol (A4C):   76.7 ml 34.83 ml/m LA Biplane Vol: 71.6 ml 32.51 ml/m  AORTIC VALVE LVOT Vmax:   94.40 cm/s LVOT Vmean:  61.500 cm/s LVOT VTI:    0.160 m AI PHT:  595 msec  AORTA Ao Root diam: 3.80 cm Ao Asc diam:  4.35 cm MV E velocity: 30.00 cm/s MV A velocity: 82.60 cm/s  SHUNTS MV E/A ratio:  0.36        Systemic VTI:  0.16 m                            Systemic Diam: 2.00 cm Jenkins Rouge MD Electronically signed by Jenkins Rouge MD Signature Date/Time: 02/04/2021/3:15:48 PM    Final     Cardiac Studies     Patient Profile     60 y.o. male admitted with nausea and acute kidney failure.  His renal function has improved with hydration.  He also has a history of hypertension, CHF.  Assessment & Plan    1.  Acute on chronic combined systolic and diastolic congestive heart failure: Patient has an acute deterioration of his LV function compared to the previous echocardiogram in 2020.  We have  scheduled him for right and left heart catheterization tomorrow.  We will also consent him for possible PCI.  We discussed the risks, benefits, options of heart catheterization.  He understands and agrees to proceed.  His renal function has been stable since yesterday so that is not as much of an issue.  2.  Hypertension: Blood pressure remains moderately elevated. We will increase the carvedilol to 12.5 mg twice a day.  We will add spironolactone 12.5 mg a day with the anticipation of increasing that to 25 mg a day following his heart catheterization. He was started on Entresto last night so that should also start to lower his blood pressure.  We will add hydralazine 10 mg every 8 hours.  3.  Nonsustained ventricular tachycardia: He is having fewer episodes of nonsustained VT. We will increase the carvedilol to 12.5 mg twice a day. We will discuss with EP following his heart catheterization. He is not had any syncope related to his nonsustained ventricular tachycardia.     For questions or updates, please contact [place] Please consult [email or web address] for contact info under        Signed, Mertie Moores, MD  02/05/2021, 10:50 AM

## 2021-02-05 NOTE — TOC Benefit Eligibility Note (Signed)
Transition of Care [facility]) Benefit Eligibility Note    Patient Details  Name: Darius Rivers MRN: [id number] Date of Birth: [birth year]   Medication/Dose: Darius Rivers  24-26 MG BID  Covered?: Yes  Tier: 3 Drug  Prescription Coverage Preferred Pharmacy: Darius Rivers with Person/Company/Phone Number:: [employer]  @ [employer] [facility] # [phone]  Co-Pay: $ [phone]  Prior Approval: No  Deductible: Unmet (OUT-OF-POCKET:UNMET)       Memory Argue Phone Number: 02/05/2021, 11:07 AM

## 2021-02-05 NOTE — Progress Notes (Addendum)
Progress Note  Patient Name: Darius Rivers Date of Encounter: 02/05/2021  [facility] Cardiologist: Pixie Casino, MD    Subjective   60 year old gentleman with history of hypertension, hyperlipidemia, congestive heart failure. He was admitted with episodes of palpitations and nausea.  He is found to have acute renal failure which has improved with IV fluids. He is also been found to have episodes of nonsustained ventricular tachycardia as well as acute deterioration in his LV function.  He is scheduled for heart catheterization tomorrow for further evaluation of his acute deterioration in his left ventricular function.  Still having rare episodes of nonsustained ventricular tachycardia.  He had 2 separate episodes of 5 beat runs of nonsustained ventricular tachycardia overnight.  He is breathing well.  He is able to lie flat without any difficulty.  Inpatient Medications    Scheduled Meds: . aspirin EC  81 mg Oral Daily  . carvedilol  6.25 mg Oral BID WC  . docusate sodium  100 mg Oral BID  . enoxaparin (LOVENOX) injection  40 mg Subcutaneous Daily  . isosorbide mononitrate  30 mg Oral Daily  . potassium chloride  40 mEq Oral BID  . rosuvastatin  40 mg Oral q1800  . sacubitril-valsartan  1 tablet Oral BID  . sodium chloride flush  3 mL Intravenous Q12H   Continuous Infusions: . sodium chloride Stopped (02/03/21 1918)   PRN Meds: acetaminophen **OR** acetaminophen, bisacodyl, hydrALAZINE, HYDROcodone-acetaminophen, labetalol, morphine injection, polyethylene glycol, prochlorperazine   Vital Signs    Vitals:   02/04/21 2142 02/04/21 2357 02/05/21 0445 02/05/21 0840  BP: (!) 166/104 (!) 138/103 (!) 171/107 (!) 162/100  Pulse: 73 82 76   Resp: 17 16 17 19   Temp: 98.3 F (36.8 C) 98.8 F (37.1 C) 99.3 F (37.4 C)   TempSrc: Oral Oral Oral   SpO2: 99% 98% 100%   Weight:   82.3 kg   Height:   6\' 4"  (1.93 m)     Intake/Output Summary (Last 24 hours) at  02/05/2021 1050 Last data filed at 02/05/2021 0800 Gross per 24 hour  Intake 1806.04 ml  Output 925 ml  Net 881.04 ml   Last 3 Weights 02/05/2021 02/04/2021 02/03/2021  Weight (lbs) 181 lb 8 oz 193 lb 3.2 oz 195 lb  Weight (kg) 82.328 kg 87.635 kg 88.451 kg      Telemetry    Normal sinus rhythm.  He had 2 episodes of nonsustained ventricular tachycardia lasting 5 beats each.- Personally Reviewed  ECG     - Personally Reviewed  Physical Exam   GEN: No acute distress.  Middle-aged gentleman, no acute distress Neck: No JVD Cardiac: RRR, no murmurs, rubs, or gallops.  Respiratory: Clear to auscultation bilaterally. GI: Soft, nontender, non-distended  MS: No edema; No deformity. Neuro:  Nonfocal  Psych: Normal affect   Labs    High Sensitivity Troponin:   Recent Labs  Lab 02/03/21 1600 02/03/21 1825  TROPONINIHS 78* 65*      Chemistry Recent Labs  Lab 02/03/21 1600 02/04/21 0633 02/05/21 0346  NA 132* 133* 133*  K 3.1* 3.6 3.3*  CL 97* 101 101  CO2 25 25 24   GLUCOSE 154* 113* 104*  BUN 29* 25* 17  CREATININE 2.06* 1.39* 1.31*  CALCIUM 9.0 8.8* 9.0  PROT 7.7  --   --   ALBUMIN 4.0  --   --   AST 19  --   --   ALT 13  --   --  ALKPHOS 51  --   --   BILITOT 0.7  --   --   GFRNONAA 37* 59* >60  ANIONGAP 10 7 8      Hematology Recent Labs  Lab 02/03/21 1600 02/04/21 0633 02/05/21 0346  WBC 12.1* 12.6* 9.9  RBC 4.30 4.03* 3.98*  HGB 14.5 13.7 13.7  HCT 41.4 38.6* 38.8*  MCV 96.3 95.8 97.5  MCH 33.7 34.0 34.4*  MCHC 35.0 35.5 35.3  RDW 13.9 13.7 13.5  PLT 380 341 339    BNP Recent Labs  Lab 02/03/21 1600  BNP 107.4*     DDimer No results for input(s): DDIMER in the last 168 hours.   Radiology    DG Chest 1 View  Result Date: 02/03/2021 CLINICAL DATA:  Nausea. EXAM: CHEST  1 VIEW COMPARISON:  November 10, 2018 FINDINGS: There is stable prominence of the mediastinum in the region of the ascending thoracic aorta secondary to the patient's known  thoracic aortic aneurysm. The heart size and mediastinal contours are otherwise within normal limits. Both lungs are clear. The visualized skeletal structures are unremarkable. IMPRESSION: 1. Stable exam without evidence of active cardiopulmonary disease. Electronically Signed   By: Virgina Norfolk M.D.   On: 02/03/2021 17:17   ECHOCARDIOGRAM COMPLETE  Result Date: 02/04/2021    ECHOCARDIOGRAM REPORT   Patient Name:   Darius Rivers Date of Exam: 02/04/2021 Medical Rec #:  [id number]        Height:       76.5 in Accession #:    [id number]       Weight:       195.0 lb Date of Birth:  [birth year]         BSA:          2.202 m Patient Age:    61 years         BP:           184/111 mmHg Patient Gender: M                HR:           82 bpm. Exam Location:  Inpatient Procedure: 2D Echo Indications:    ventricular tachycardia  History:        Patient has prior history of Echocardiogram examinations, most                 recent 11/12/2018. Chronic kidney disease; Risk                 Factors:Dyslipidemia.  Sonographer:    Johny Chess Referring Phys: [place]  1. Diffuse hypokinesis worse in the inferior base . Left ventricular ejection fraction, by estimation, is 25 to 30%. The left ventricle has severely decreased function. The left ventricle has no regional wall motion abnormalities. The left ventricular internal cavity size was moderately dilated. There is mild left ventricular hypertrophy. Left ventricular diastolic parameters were normal.  2. Right ventricular systolic function is normal. The right ventricular size is normal.  3. Left atrial size was mildly dilated.  4. The mitral valve is normal in structure. Trivial mitral valve regurgitation. No evidence of mitral stenosis.  5. The aortic valve was not well visualized. There is moderate calcification of the aortic valve. Aortic valve regurgitation is moderate. Mild to moderate aortic valve sclerosis/calcification is present, without  any evidence of aortic stenosis.  6. Aortic dilatation noted. There is moderate dilatation of the ascending aorta, measuring 44 mm.  7. The inferior  vena cava is normal in size with greater than 50% respiratory variability, suggesting right atrial pressure of 3 mmHg. FINDINGS  Left Ventricle: Diffuse hypokinesis worse in the inferior base. Left ventricular ejection fraction, by estimation, is 25 to 30%. The left ventricle has severely decreased function. The left ventricle has no regional wall motion abnormalities. Global longitudinal strain performed but not reported based on interpreter judgement due to suboptimal tracking. 3D left ventricular ejection fraction analysis performed but not reported based on interpreter judgement due to suboptimal quality. The left ventricular internal cavity size was moderately dilated. There is mild left ventricular hypertrophy. Left ventricular diastolic parameters were normal. Right Ventricle: The right ventricular size is normal. No increase in right ventricular wall thickness. Right ventricular systolic function is normal. Left Atrium: Left atrial size was mildly dilated. Right Atrium: Right atrial size was normal in size. Pericardium: There is no evidence of pericardial effusion. Mitral Valve: The mitral valve is normal in structure. Trivial mitral valve regurgitation. No evidence of mitral valve stenosis. Tricuspid Valve: The tricuspid valve is normal in structure. Tricuspid valve regurgitation is mild . No evidence of tricuspid stenosis. Aortic Valve: The aortic valve was not well visualized. There is moderate calcification of the aortic valve. Aortic valve regurgitation is moderate. Aortic regurgitation PHT measures 595 msec. Mild to moderate aortic valve sclerosis/calcification is present, without any evidence of aortic stenosis. Pulmonic Valve: The pulmonic valve was normal in structure. Pulmonic valve regurgitation is not visualized. No evidence of pulmonic stenosis.  Aorta: The aortic root is normal in size and structure and aortic dilatation noted. There is moderate dilatation of the ascending aorta, measuring 44 mm. Venous: The inferior vena cava is normal in size with greater than 50% respiratory variability, suggesting right atrial pressure of 3 mmHg. IAS/Shunts: No atrial level shunt detected by color flow Doppler.  LEFT VENTRICLE PLAX 2D LVIDd:         5.60 cm      Diastology LVIDs:         5.00 cm      LV e' lateral:   3.26 cm/s LV PW:         1.10 cm      LV E/e' lateral: 9.2 LV IVS:        1.10 cm LVOT diam:     2.00 cm LV SV:         50 LV SV Index:   23 LVOT Area:     3.14 cm  LV Volumes (MOD) LV vol d, MOD A4C: 187.0 ml LV vol s, MOD A4C: 142.0 ml LV SV MOD A4C:     187.0 ml RIGHT VENTRICLE             IVC RV S prime:     11.20 cm/s  IVC diam: 0.70 cm TAPSE (M-mode): 1.3 cm LEFT ATRIUM             Index       RIGHT ATRIUM           Index LA diam:        4.10 cm 1.86 cm/m  RA Area:     16.70 cm LA Vol (A2C):   64.7 ml 29.38 ml/m RA Volume:   41.30 ml  18.75 ml/m LA Vol (A4C):   76.7 ml 34.83 ml/m LA Biplane Vol: 71.6 ml 32.51 ml/m  AORTIC VALVE LVOT Vmax:   94.40 cm/s LVOT Vmean:  61.500 cm/s LVOT VTI:    0.160 m AI PHT:  595 msec  AORTA Ao Root diam: 3.80 cm Ao Asc diam:  4.35 cm MV E velocity: 30.00 cm/s MV A velocity: 82.60 cm/s  SHUNTS MV E/A ratio:  0.36        Systemic VTI:  0.16 m                            Systemic Diam: 2.00 cm Jenkins Rouge MD Electronically signed by Jenkins Rouge MD Signature Date/Time: 02/04/2021/3:15:48 PM    Final     Cardiac Studies     Patient Profile     60 y.o. male admitted with nausea and acute kidney failure.  His renal function has improved with hydration.  He also has a history of hypertension, CHF.  Assessment & Plan    1.  Acute on chronic combined systolic and diastolic congestive heart failure: Patient has an acute deterioration of his LV function compared to the previous echocardiogram in 2020.  We have  scheduled him for right and left heart catheterization tomorrow.  We will also consent him for possible PCI.  We discussed the risks, benefits, options of heart catheterization.  He understands and agrees to proceed.  His renal function has been stable since yesterday so that is not as much of an issue.  2.  Hypertension: Blood pressure remains moderately elevated. We will increase the carvedilol to 12.5 mg twice a day.  We will add spironolactone 12.5 mg a day with the anticipation of increasing that to 25 mg a day following his heart catheterization. He was started on Entresto last night so that should also start to lower his blood pressure.  We will add hydralazine 10 mg every 8 hours.  3.  Nonsustained ventricular tachycardia: He is having fewer episodes of nonsustained VT. We will increase the carvedilol to 12.5 mg twice a day. We will discuss with EP following his heart catheterization. He is not had any syncope related to his nonsustained ventricular tachycardia.     For questions or updates, please contact [place] Please consult [email or web address] for contact info under        Signed, Mertie Moores, MD  02/05/2021, 10:50 AM

## 2021-02-06 ENCOUNTER — Inpatient Hospital Stay (HOSPITAL_COMMUNITY)
Admission: EM | Disposition: A | Discharge status: Home / Self Care | Payer: Self-pay | Source: Home / Self Care | Attending: Internal Medicine

## 2021-02-06 DIAGNOSIS — N179 Acute kidney failure, unspecified: Principal | ICD-10-CM

## 2021-02-06 DIAGNOSIS — I5042 Chronic combined systolic (congestive) and diastolic (congestive) heart failure: Secondary | ICD-10-CM | POA: Diagnosis not present

## 2021-02-06 DIAGNOSIS — I420 Dilated cardiomyopathy: Secondary | ICD-10-CM

## 2021-02-06 DIAGNOSIS — I10 Essential (primary) hypertension: Secondary | ICD-10-CM

## 2021-02-06 HISTORY — PX: RIGHT/LEFT HEART CATH AND CORONARY ANGIOGRAPHY: CATH118266

## 2021-02-06 LAB — POCT I-STAT EG7
Acid-Base Excess: 0 mmol/L (ref 0.0–2.0)
Acid-Base Excess: 1 mmol/L (ref 0.0–2.0)
Bicarbonate: 25 mmol/L (ref 20.0–28.0)
Bicarbonate: 26 mmol/L (ref 20.0–28.0)
Calcium, Ion: 1.22 mmol/L (ref 1.15–1.40)
Calcium, Ion: 1.25 mmol/L (ref 1.15–1.40)
HCT: 37 % — ABNORMAL LOW (ref 39.0–52.0)
HCT: 37 % — ABNORMAL LOW (ref 39.0–52.0)
Hemoglobin: 12.6 g/dL — ABNORMAL LOW (ref 13.0–17.0)
Hemoglobin: 12.6 g/dL — ABNORMAL LOW (ref 13.0–17.0)
O2 Saturation: 62 %
O2 Saturation: 65 %
Potassium: 4.1 mmol/L (ref 3.5–5.1)
Potassium: 4.1 mmol/L (ref 3.5–5.1)
Sodium: 136 mmol/L (ref 135–145)
Sodium: 136 mmol/L (ref 135–145)
TCO2: 26 mmol/L (ref 22–32)
TCO2: 27 mmol/L (ref 22–32)
pCO2, Ven: 41.2 mmHg — ABNORMAL LOW (ref 44.0–60.0)
pCO2, Ven: 42.2 mmHg — ABNORMAL LOW (ref 44.0–60.0)
pH, Ven: 7.391 (ref 7.250–7.430)
pH, Ven: 7.398 (ref 7.250–7.430)
pO2, Ven: 33 mmHg (ref 32.0–45.0)
pO2, Ven: 34 mmHg (ref 32.0–45.0)

## 2021-02-06 LAB — BASIC METABOLIC PANEL
Anion gap: 7 (ref 5–15)
BUN: 19 mg/dL (ref 6–20)
CO2: 23 mmol/L (ref 22–32)
Calcium: 9 mg/dL (ref 8.9–10.3)
Chloride: 103 mmol/L (ref 98–111)
Creatinine, Ser: 1.4 mg/dL — ABNORMAL HIGH (ref 0.61–1.24)
GFR, Estimated: 59 mL/min — ABNORMAL LOW (ref >60)
Glucose, Bld: 100 mg/dL — ABNORMAL HIGH (ref 70–99)
Potassium: 4 mmol/L (ref 3.5–5.1)
Sodium: 133 mmol/L — ABNORMAL LOW (ref 135–145)

## 2021-02-06 LAB — CBC
HCT: 38.3 % — ABNORMAL LOW (ref 39.0–52.0)
Hemoglobin: 12.9 g/dL — ABNORMAL LOW (ref 13.0–17.0)
MCH: 33.5 pg (ref 26.0–34.0)
MCHC: 33.7 g/dL (ref 30.0–36.0)
MCV: 99.5 fL (ref 80.0–100.0)
Platelets: 324 10*3/uL (ref 150–400)
RBC: 3.85 MIL/uL — ABNORMAL LOW (ref 4.22–5.81)
RDW: 13.7 % (ref 11.5–15.5)
WBC: 9 10*3/uL (ref 4.0–10.5)
nRBC: 0 % (ref 0.0–0.2)

## 2021-02-06 LAB — CBC WITH DIFFERENTIAL/PLATELET
Abs Immature Granulocytes: 0.03 10*3/uL (ref 0.00–0.07)
Basophils Absolute: 0 10*3/uL (ref 0.0–0.1)
Basophils Relative: 0 %
Eosinophils Absolute: 0.1 10*3/uL (ref 0.0–0.5)
Eosinophils Relative: 1 %
HCT: 39.6 % (ref 39.0–52.0)
Hemoglobin: 13.6 g/dL (ref 13.0–17.0)
Immature Granulocytes: 0 %
Lymphocytes Relative: 36 %
Lymphs Abs: 3.6 10*3/uL (ref 0.7–4.0)
MCH: 33.3 pg (ref 26.0–34.0)
MCHC: 34.3 g/dL (ref 30.0–36.0)
MCV: 97.1 fL (ref 80.0–100.0)
Monocytes Absolute: 0.8 10*3/uL (ref 0.1–1.0)
Monocytes Relative: 8 %
Neutro Abs: 5.5 10*3/uL (ref 1.7–7.7)
Neutrophils Relative %: 55 %
Platelets: 330 10*3/uL (ref 150–400)
RBC: 4.08 MIL/uL — ABNORMAL LOW (ref 4.22–5.81)
RDW: 13.6 % (ref 11.5–15.5)
WBC: 10 10*3/uL (ref 4.0–10.5)
nRBC: 0 % (ref 0.0–0.2)

## 2021-02-06 LAB — CREATININE, SERUM
Creatinine, Ser: 1.42 mg/dL — ABNORMAL HIGH (ref 0.61–1.24)
GFR, Estimated: 58 mL/min — ABNORMAL LOW (ref >60)

## 2021-02-06 LAB — POCT I-STAT 7, (LYTES, BLD GAS, ICA,H+H)
Acid-Base Excess: 0 mmol/L (ref 0.0–2.0)
Bicarbonate: 23.9 mmol/L (ref 20.0–28.0)
Calcium, Ion: 1.25 mmol/L (ref 1.15–1.40)
HCT: 37 % — ABNORMAL LOW (ref 39.0–52.0)
Hemoglobin: 12.6 g/dL — ABNORMAL LOW (ref 13.0–17.0)
O2 Saturation: 99 %
Potassium: 4.1 mmol/L (ref 3.5–5.1)
Sodium: 136 mmol/L (ref 135–145)
TCO2: 25 mmol/L (ref 22–32)
pCO2 arterial: 36.2 mmHg (ref 32.0–48.0)
pH, Arterial: 7.427 (ref 7.350–7.450)
pO2, Arterial: 127 mmHg — ABNORMAL HIGH (ref 83.0–108.0)

## 2021-02-06 LAB — MAGNESIUM: Magnesium: 2.1 mg/dL (ref 1.7–2.4)

## 2021-02-06 LAB — PHOSPHORUS: Phosphorus: 3.2 mg/dL (ref 2.5–4.6)

## 2021-02-06 SURGERY — RIGHT/LEFT HEART CATH AND CORONARY ANGIOGRAPHY
Anesthesia: LOCAL

## 2021-02-06 MED ORDER — SODIUM CHLORIDE 0.9% FLUSH
3.0000 mL | Freq: Two times a day (BID) | INTRAVENOUS | Status: DC
  Administered 2021-02-07: 3 mL via INTRAVENOUS

## 2021-02-06 MED ORDER — SODIUM CHLORIDE 0.9% FLUSH: 3.0000 mL | INTRAVENOUS | Status: DC | PRN

## 2021-02-06 MED ORDER — VERAPAMIL HCL 2.5 MG/ML IV SOLN
INTRAVENOUS | Status: DC | PRN
  Administered 2021-02-06: 10 mL via INTRA_ARTERIAL

## 2021-02-06 MED ORDER — HEPARIN SODIUM (PORCINE) 1000 UNIT/ML IJ SOLN
INTRAMUSCULAR | Status: AC
  Filled 2021-02-06: qty 1

## 2021-02-06 MED ORDER — ACETAMINOPHEN 325 MG PO TABS: 650.0000 mg | ORAL_TABLET | ORAL | Status: DC | PRN

## 2021-02-06 MED ORDER — HEPARIN (PORCINE) IN NACL 1000-0.9 UT/500ML-% IV SOLN
INTRAVENOUS | Status: AC
  Filled 2021-02-06: qty 1000

## 2021-02-06 MED ORDER — MIDAZOLAM HCL 2 MG/2ML IJ SOLN
INTRAMUSCULAR | Status: DC | PRN
  Administered 2021-02-06: 2 mg via INTRAVENOUS

## 2021-02-06 MED ORDER — FENTANYL CITRATE (PF) 100 MCG/2ML IJ SOLN
INTRAMUSCULAR | Status: DC | PRN
  Administered 2021-02-06: 25 ug via INTRAVENOUS

## 2021-02-06 MED ORDER — LIDOCAINE HCL (PF) 1 % IJ SOLN
INTRAMUSCULAR | Status: AC
  Filled 2021-02-06: qty 30

## 2021-02-06 MED ORDER — SODIUM CHLORIDE 0.9 % IV SOLN: 250.0000 mL | INTRAVENOUS | Status: DC | PRN

## 2021-02-06 MED ORDER — IOHEXOL 350 MG/ML SOLN
INTRAVENOUS | Status: DC | PRN
  Administered 2021-02-06: 60 mL

## 2021-02-06 MED ORDER — FENTANYL CITRATE (PF) 100 MCG/2ML IJ SOLN
INTRAMUSCULAR | Status: AC
  Filled 2021-02-06: qty 2

## 2021-02-06 MED ORDER — LABETALOL HCL 5 MG/ML IV SOLN: 10.0000 mg | INTRAVENOUS | Status: AC | PRN

## 2021-02-06 MED ORDER — ONDANSETRON HCL 4 MG/2ML IJ SOLN: 4.0000 mg | Freq: Four times a day (QID) | INTRAMUSCULAR | Status: DC | PRN

## 2021-02-06 MED ORDER — VERAPAMIL HCL 2.5 MG/ML IV SOLN
INTRAVENOUS | Status: AC
  Filled 2021-02-06: qty 2

## 2021-02-06 MED ORDER — SODIUM CHLORIDE 0.9 % IV SOLN: INTRAVENOUS | Status: DC

## 2021-02-06 MED ORDER — HEPARIN SODIUM (PORCINE) 1000 UNIT/ML IJ SOLN
INTRAMUSCULAR | Status: DC | PRN
  Administered 2021-02-06: 4000 [IU] via INTRAVENOUS

## 2021-02-06 MED ORDER — MIDAZOLAM HCL 2 MG/2ML IJ SOLN
INTRAMUSCULAR | Status: AC
  Filled 2021-02-06: qty 2

## 2021-02-06 MED ORDER — ENOXAPARIN SODIUM 40 MG/0.4ML ~~LOC~~ SOLN
40.0000 mg | SUBCUTANEOUS | Status: DC
  Administered 2021-02-07: 40 mg via SUBCUTANEOUS
  Filled 2021-02-06: qty 0.4

## 2021-02-06 MED ORDER — LIDOCAINE HCL (PF) 1 % IJ SOLN
INTRAMUSCULAR | Status: DC | PRN
  Administered 2021-02-06 (×2): 2 mL

## 2021-02-06 MED ORDER — HEPARIN (PORCINE) IN NACL 1000-0.9 UT/500ML-% IV SOLN
INTRAVENOUS | Status: DC | PRN
  Administered 2021-02-06 (×2): 500 mL

## 2021-02-06 MED ORDER — HYDRALAZINE HCL 20 MG/ML IJ SOLN: 10.0000 mg | INTRAMUSCULAR | Status: AC | PRN

## 2021-02-06 MED ORDER — SODIUM CHLORIDE 0.9 % IV SOLN: INTRAVENOUS | Status: AC

## 2021-02-06 MED ORDER — DIAZEPAM 5 MG PO TABS: 5.0000 mg | ORAL_TABLET | ORAL | Status: DC | PRN

## 2021-02-06 SURGICAL SUPPLY — 13 items
CATH INFINITI JR4 5F (CATHETERS) ×2 IMPLANT
CATH OPTITORQUE TIG 4.0 5F (CATHETERS) ×2 IMPLANT
CATH SWAN GANZ 7F STRAIGHT (CATHETERS) ×2 IMPLANT
DEVICE RAD COMP TR BAND LRG (VASCULAR PRODUCTS) ×2 IMPLANT
GLIDESHEATH SLEND SS 6F .021 (SHEATH) ×2 IMPLANT
GLIDESHEATH SLENDER 7FR .021G (SHEATH) ×2 IMPLANT
GUIDEWIRE INQWIRE 1.5J.035X260 (WIRE) ×1 IMPLANT
INQWIRE 1.5J .035X260CM (WIRE) ×2
KIT HEART LEFT (KITS) ×2 IMPLANT
PACK CARDIAC CATHETERIZATION (CUSTOM PROCEDURE TRAY) ×2 IMPLANT
TRANSDUCER W/STOPCOCK (MISCELLANEOUS) ×2 IMPLANT
TUBING CIL FLEX 10 FLL-RA (TUBING) ×2 IMPLANT
WIRE EMERALD 3MM-J .025X260CM (WIRE) ×2 IMPLANT

## 2021-02-06 NOTE — Progress Notes (Signed)
PROGRESS NOTE  Darius Rivers NUU:[id number] DOB: 1961-02-02 DOA: 02/03/2021 PCP: Fortino Sic, PA  HPI/Recap of past 71 hours: 60 year old gentleman with history of hypertension, chronic combined congestive heart failure, hyperlipidemia, history of NSVT, chronic kidney disease stage II, prostate cancer presented to the emergency room with episode of nausea and vomiting after eating a strawberry shortcake at home.  He had continued symptoms for about 2 days so came to the ER.  Denies any chest pain or palpitations.  In the emergency room, he was with low blood pressures, mild elevated troponins, hypokalemia.  Found to have AKI.  Admitted to hospital with cardiology consultation.  02/06/21: Patient seen and examined at bedside.  He denies having any chest pain or dyspnea or palpitations.  Seen by cardiology with plan for heart cath on 02/06/2021.  Assessment/Plan: Principal Problem:   AKI (acute kidney injury) ([place]) Active Problems:   Hyperlipidemia   Chronic combined systolic and diastolic CHF (congestive heart failure) (HCC)   CKD (chronic kidney disease), stage III (HCC)   NSVT (nonsustained ventricular tachycardia) (HCC)   Prolonged QT interval   Essential hypertension   AAA (abdominal aortic aneurysm) ([place])   H/O prostate cancer  Acute on chronic combined congestive heart failure: Echocardiogram 1/20 with ejection fraction 45% and grade 1 diastolic dysfunction. Echocardiogram 4/4 with reduced ejection fraction 25 to 30%.   Euvolemic. Personally reviewed chest x-ray no evidence of pulmonary edema. Due to planned heart cath we will start gentle IV fluid hydration normal saline at 50 cc/h and DC IV fluid in the morning to avoid contrast-induced nephropathy. Currently on aspirin, Entresto, Aldactone, carvedilol.  Strict I's and O's and daily weight   Acute kidney injury with history of chronic kidney disease stage II:  Baseline creatinine about 1.34-1.5 historically.    Presented with dehydration.  Treated with IV fluid. Started back on Entresto on 02/05/2021. Avoid nephrotoxic agent if possible, also avoid hypotension. IV fluid hydration pre and post Cath, will DC on 02/07/2021 morning. Monitor urine output Repeat renal panel in the morning.  NSVT:  Has prolonged run of NSVT upon arrival at the emergency room.   Does have history of NSVT.  Continue beta-blocker.  Hypertension:  BP stable On Entresto and carvedilol.  Hyperlipidemia:  On Crestor.  Resolved post repletion: Hypokalemia:  Serum potassium 4.0 from 3.3.   DVT prophylaxis: enoxaparin (LOVENOX) injection 40 mg Start: 02/04/21 1130  Code Status: Full code Family Communication: None at the bedside. Disposition Plan: Status is: Inpatient  Remains inpatient appropriate because:IV treatments appropriate due to intensity of illness or inability to take PO and Inpatient level of care appropriate due to severity of illness   Dispo: The patient is from: Home  Anticipated d/c is to: Home when cardiology signs off possibly on 02/07/2021.  Patient currently is not medically stable to d/c.              Difficult to place patient No         Consultants:   Cardiology  Procedures:   None  Antimicrobials:   None        Objective: Vitals:   02/06/21 1236 02/06/21 1241 02/06/21 1246 02/06/21 1312  BP: 114/79 122/83 116/84 126/89  Pulse: 66 74 76 72  Resp: (!) 21 (!) 24 16 16   Temp:    98.2 F (36.8 C)  TempSrc:    Oral  SpO2: 100% 100% 100% 98%  Weight:      Height:  Intake/Output Summary (Last 24 hours) at 02/06/2021 1328 Last data filed at 02/06/2021 0510 Gross per 24 hour  Intake 360 ml  Output 810 ml  Net -450 ml   Filed Weights   02/04/21 0959 02/05/21 0445 02/06/21 0509  Weight: 87.6 kg 82.3 kg 81.6 kg    Exam:  . General: 60 y.o. year-old male well developed well nourished in no acute distress.  Alert and  oriented x3. . Cardiovascular: Regular rate and rhythm with no rubs or gallops.  No thyromegaly or JVD noted.   Marland Kitchen Respiratory: Clear to auscultation with no wheezes or rales. Good inspiratory effort. . Abdomen: Soft nontender nondistended with normal bowel sounds x4 quadrants. . Musculoskeletal: No lower extremity edema. 2/4 pulses in all 4 extremities. . Skin: No ulcerative lesions noted or rashes, . Psychiatry: Mood is appropriate for condition and setting   Data Reviewed: CBC: Recent Labs  Lab 02/03/21 1600 02/04/21 0633 02/05/21 0346 02/06/21 0235  WBC 12.1* 12.6* 9.9 10.0  NEUTROABS 8.2*  --   --  5.5  HGB 14.5 13.7 13.7 13.6  HCT 41.4 38.6* 38.8* 39.6  MCV 96.3 95.8 97.5 97.1  PLT 380 341 339 633   Basic Metabolic Panel: Recent Labs  Lab 02/03/21 1600 02/04/21 0633 02/05/21 0346 02/06/21 0235  NA 132* 133* 133* 133*  K 3.1* 3.6 3.3* 4.0  CL 97* 101 101 103  CO2 25 25 24 23   GLUCOSE 154* 113* 104* 100*  BUN 29* 25* 17 19  CREATININE 2.06* 1.39* 1.31* 1.40*  CALCIUM 9.0 8.8* 9.0 9.0  MG 2.9*  --   --  2.1  PHOS  --   --   --  3.2   GFR: Estimated Creatinine Clearance: 67.2 mL/min (A) (by C-G formula based on SCr of 1.4 mg/dL (H)). Liver Function Tests: Recent Labs  Lab 02/03/21 1600  AST 19  ALT 13  ALKPHOS 51  BILITOT 0.7  PROT 7.7  ALBUMIN 4.0   Recent Labs  Lab 02/03/21 1600  LIPASE 32   No results for input(s): AMMONIA in the last 168 hours. Coagulation Profile: No results for input(s): INR, PROTIME in the last 168 hours. Cardiac Enzymes: No results for input(s): CKTOTAL, CKMB, CKMBINDEX, TROPONINI in the last 168 hours. BNP (last 3 results) No results for input(s): PROBNP in the last 8760 hours. HbA1C: No results for input(s): HGBA1C in the last 72 hours. CBG: No results for input(s): GLUCAP in the last 168 hours. Lipid Profile: No results for input(s): CHOL, HDL, LDLCALC, TRIG, CHOLHDL, LDLDIRECT in the last 72 hours. Thyroid Function  Tests: Recent Labs    02/04/21 1141  TSH 0.637   Anemia Panel: No results for input(s): VITAMINB12, FOLATE, FERRITIN, TIBC, IRON, RETICCTPCT in the last 72 hours. Urine analysis:    Component Value Date/Time   COLORURINE YELLOW 02/04/2021 0010   APPEARANCEUR CLEAR 02/04/2021 0010   LABSPEC 1.025 02/04/2021 0010   PHURINE 6.0 02/04/2021 0010   GLUCOSEU NEGATIVE 02/04/2021 0010   HGBUR MODERATE (A) 02/04/2021 0010   BILIRUBINUR NEGATIVE 02/04/2021 0010   KETONESUR NEGATIVE 02/04/2021 0010   PROTEINUR NEGATIVE 02/04/2021 0010   NITRITE NEGATIVE 02/04/2021 0010   LEUKOCYTESUR NEGATIVE 02/04/2021 0010   Sepsis Labs: @LABRCNTIP (procalcitonin:4,lacticidven:4)  ) Recent Results (from the past 240 hour(s))  Resp Panel by RT-PCR (Flu A&B, Covid) Nasopharyngeal Swab     Status: None   Collection Time: 02/03/21  5:08 PM   Specimen: Nasopharyngeal Swab; Nasopharyngeal(NP) swabs in vial transport medium  Result  Value Ref Range Status   SARS Coronavirus 2 by RT PCR NEGATIVE NEGATIVE Final    Comment: (NOTE) SARS-CoV-2 target nucleic acids are NOT DETECTED.  The SARS-CoV-2 RNA is generally detectable in upper respiratory specimens during the acute phase of infection. The lowest concentration of SARS-CoV-2 viral copies this assay can detect is 138 copies/mL. A negative result does not preclude SARS-Cov-2 infection and should not be used as the sole basis for treatment or other patient management decisions. A negative result may occur with  improper specimen collection/handling, submission of specimen other than nasopharyngeal swab, presence of viral mutation(s) within the areas targeted by this assay, and inadequate number of viral copies(<138 copies/mL). A negative result must be combined with clinical observations, patient history, and epidemiological information. The expected result is Negative.  Fact Sheet for Patients:  EntrepreneurPulse.com.au  Fact Sheet for  Healthcare Providers:  IncredibleEmployment.be  This test is no t yet approved or cleared by the [place] FDA and  has been authorized for detection and/or diagnosis of SARS-CoV-2 by FDA under an Emergency Use Authorization (EUA). This EUA will remain  in effect (meaning this test can be used) for the duration of the COVID-19 declaration under Section 564(b)(1) of the Act, 21 U.S.C.section 360bbb-3(b)(1), unless the authorization is terminated  or revoked sooner.       Influenza A by PCR NEGATIVE NEGATIVE Final   Influenza B by PCR NEGATIVE NEGATIVE Final    Comment: (NOTE) The Xpert Xpress SARS-CoV-2/FLU/RSV plus assay is intended as an aid in the diagnosis of influenza from Nasopharyngeal swab specimens and should not be used as a sole basis for treatment. Nasal washings and aspirates are unacceptable for Xpert Xpress SARS-CoV-2/FLU/RSV testing.  Fact Sheet for Patients: EntrepreneurPulse.com.au  Fact Sheet for Healthcare Providers: IncredibleEmployment.be  This test is not yet approved or cleared by the [place] FDA and has been authorized for detection and/or diagnosis of SARS-CoV-2 by FDA under an Emergency Use Authorization (EUA). This EUA will remain in effect (meaning this test can be used) for the duration of the COVID-19 declaration under Section 564(b)(1) of the Act, 21 U.S.C. section 360bbb-3(b)(1), unless the authorization is terminated or revoked.  Performed at [facility], [address] [place], [place] [address]       Studies: No results found.  Scheduled Meds: . aspirin EC  81 mg Oral Daily  . carvedilol  12.5 mg Oral BID WC  . docusate sodium  100 mg Oral BID  . enoxaparin (LOVENOX) injection  40 mg Subcutaneous Daily  . [START ON 02/07/2021] enoxaparin (LOVENOX) injection  40 mg Subcutaneous Q24H  . feeding supplement  237 mL Oral BID BM  . hydrALAZINE  10 mg Oral Q8H  .  isosorbide mononitrate  30 mg Oral Daily  . rosuvastatin  40 mg Oral q1800  . sacubitril-valsartan  1 tablet Oral BID  . sodium chloride flush  3 mL Intravenous Q12H  . sodium chloride flush  3 mL Intravenous Q12H  . spironolactone  12.5 mg Oral Daily    Continuous Infusions: . sodium chloride 80 mL/hr at 02/06/21 1318  . sodium chloride    . sodium chloride Stopped (02/03/21 1918)     LOS: 2 days     Kayleen Memos, MD [facility] Pager [phone]  If 7PM-7AM, please contact night-coverage [email or web address] Password [email or web address] 02/06/2021, 1:28 PM

## 2021-02-06 NOTE — Progress Notes (Signed)
Progress Note  Patient Name: Darius Rivers Date of Encounter: 02/06/2021  [facility] Cardiologist: Pixie Casino, MD    Subjective   60 year old gentleman with history of hypertension, hyperlipidemia, congestive heart failure. He was admitted with episodes of palpitations and nausea.  He is found to have acute renal failure which has improved with IV fluids. He is also been found to have episodes of nonsustained ventricular tachycardia as well as acute deterioration in his LV function.    Feeling better , schededuled for cath today   Inpatient Medications    Scheduled Meds: . aspirin EC  81 mg Oral Daily  . carvedilol  12.5 mg Oral BID WC  . docusate sodium  100 mg Oral BID  . [START ON 02/07/2021] enoxaparin (LOVENOX) injection  40 mg Subcutaneous Q24H  . feeding supplement  237 mL Oral BID BM  . hydrALAZINE  10 mg Oral Q8H  . isosorbide mononitrate  30 mg Oral Daily  . rosuvastatin  40 mg Oral q1800  . sacubitril-valsartan  1 tablet Oral BID  . sodium chloride flush  3 mL Intravenous Q12H  . sodium chloride flush  3 mL Intravenous Q12H  . spironolactone  12.5 mg Oral Daily   Continuous Infusions: . sodium chloride 80 mL/hr at 02/06/21 1318  . sodium chloride    . sodium chloride Stopped (02/03/21 1918)   PRN Meds: sodium chloride, acetaminophen, bisacodyl, diazepam, hydrALAZINE, HYDROcodone-acetaminophen, labetalol, morphine injection, ondansetron (ZOFRAN) IV, polyethylene glycol, prochlorperazine, sodium chloride flush   Vital Signs    Vitals:   02/06/21 1241 02/06/21 1246 02/06/21 1312 02/06/21 1327  BP: 122/83 116/84 126/89 113/89  Pulse: 74 76 72 75  Resp: (!) 24 16 16    Temp:   98.2 F (36.8 C)   TempSrc:   Oral   SpO2: 100% 100% 98% 99%  Weight:      Height:        Intake/Output Summary (Last 24 hours) at 02/06/2021 1347 Last data filed at 02/06/2021 0510 Gross per 24 hour  Intake 360 ml  Output 810 ml  Net -450 ml   Last 3 Weights 02/06/2021  02/05/2021 02/04/2021  Weight (lbs) 179 lb 14.4 oz 181 lb 8 oz 193 lb 3.2 oz  Weight (kg) 81.602 kg 82.328 kg 87.635 kg      Telemetry    NSR   ECG     - Personally Reviewed  Physical Exam   Physical Exam: Blood pressure 113/89, pulse 75, temperature 98.2 F (36.8 C), temperature source Oral, resp. rate 16, height 6\' 4"  (1.93 m), weight 81.6 kg, SpO2 99 %.  GEN:  Middle age male,  NAD  HEENT: Normal NECK: No JVD; No carotid bruits LYMPHATICS: No lymphadenopathy CARDIAC: lungs are clear  RESPIRATORY:  Clear to auscultation without rales, wheezing or rhonchi  ABDOMEN: Soft, non-tender, non-distended MUSCULOSKELETAL:  No edema; No deformity  SKIN: Warm and dry NEUROLOGIC:  Alert and oriented x 3   Labs    High Sensitivity Troponin:   Recent Labs  Lab 02/03/21 1600 02/03/21 1825  TROPONINIHS 78* 65*      Chemistry Recent Labs  Lab 02/03/21 1600 02/04/21 0633 02/05/21 0346 02/06/21 0235  NA 132* 133* 133* 133*  K 3.1* 3.6 3.3* 4.0  CL 97* 101 101 103  CO2 25 25 24 23   GLUCOSE 154* 113* 104* 100*  BUN 29* 25* 17 19  CREATININE 2.06* 1.39* 1.31* 1.40*  CALCIUM 9.0 8.8* 9.0 9.0  PROT 7.7  --   --   --  ALBUMIN 4.0  --   --   --   AST 19  --   --   --   ALT 13  --   --   --   ALKPHOS 51  --   --   --   BILITOT 0.7  --   --   --   GFRNONAA 37* 59* >60 59*  ANIONGAP 10 7 8 7      Hematology Recent Labs  Lab 02/04/21 0633 02/05/21 0346 02/06/21 0235  WBC 12.6* 9.9 10.0  RBC 4.03* 3.98* 4.08*  HGB 13.7 13.7 13.6  HCT 38.6* 38.8* 39.6  MCV 95.8 97.5 97.1  MCH 34.0 34.4* 33.3  MCHC 35.5 35.3 34.3  RDW 13.7 13.5 13.6  PLT 341 339 330    BNP Recent Labs  Lab 02/03/21 1600  BNP 107.4*     DDimer No results for input(s): DDIMER in the last 168 hours.   Radiology    ECHOCARDIOGRAM COMPLETE  Result Date: 02/04/2021    ECHOCARDIOGRAM REPORT   Patient Name:   Darius Rivers Date of Exam: 02/04/2021 Medical Rec #:  [id number]        Height:       76.5  in Accession #:    [id number]       Weight:       195.0 lb Date of Birth:  Nov 16, 1960         BSA:          2.202 m Patient Age:    83 years         BP:           184/111 mmHg Patient Gender: M                HR:           82 bpm. Exam Location:  Inpatient Procedure: 2D Echo Indications:    ventricular tachycardia  History:        Patient has prior history of Echocardiogram examinations, most                 recent 11/12/2018. Chronic kidney disease; Risk                 Factors:Dyslipidemia.  Sonographer:    Johny Chess Referring Phys: Morgan  1. Diffuse hypokinesis worse in the inferior base . Left ventricular ejection fraction, by estimation, is 25 to 30%. The left ventricle has severely decreased function. The left ventricle has no regional wall motion abnormalities. The left ventricular internal cavity size was moderately dilated. There is mild left ventricular hypertrophy. Left ventricular diastolic parameters were normal.  2. Right ventricular systolic function is normal. The right ventricular size is normal.  3. Left atrial size was mildly dilated.  4. The mitral valve is normal in structure. Trivial mitral valve regurgitation. No evidence of mitral stenosis.  5. The aortic valve was not well visualized. There is moderate calcification of the aortic valve. Aortic valve regurgitation is moderate. Mild to moderate aortic valve sclerosis/calcification is present, without any evidence of aortic stenosis.  6. Aortic dilatation noted. There is moderate dilatation of the ascending aorta, measuring 44 mm.  7. The inferior vena cava is normal in size with greater than 50% respiratory variability, suggesting right atrial pressure of 3 mmHg. FINDINGS  Left Ventricle: Diffuse hypokinesis worse in the inferior base. Left ventricular ejection fraction, by estimation, is 25 to 30%. The left ventricle has severely decreased function. The left ventricle  has no regional wall motion abnormalities.  Global longitudinal strain performed but not reported based on interpreter judgement due to suboptimal tracking. 3D left ventricular ejection fraction analysis performed but not reported based on interpreter judgement due to suboptimal quality. The left ventricular internal cavity size was moderately dilated. There is mild left ventricular hypertrophy. Left ventricular diastolic parameters were normal. Right Ventricle: The right ventricular size is normal. No increase in right ventricular wall thickness. Right ventricular systolic function is normal. Left Atrium: Left atrial size was mildly dilated. Right Atrium: Right atrial size was normal in size. Pericardium: There is no evidence of pericardial effusion. Mitral Valve: The mitral valve is normal in structure. Trivial mitral valve regurgitation. No evidence of mitral valve stenosis. Tricuspid Valve: The tricuspid valve is normal in structure. Tricuspid valve regurgitation is mild . No evidence of tricuspid stenosis. Aortic Valve: The aortic valve was not well visualized. There is moderate calcification of the aortic valve. Aortic valve regurgitation is moderate. Aortic regurgitation PHT measures 595 msec. Mild to moderate aortic valve sclerosis/calcification is present, without any evidence of aortic stenosis. Pulmonic Valve: The pulmonic valve was normal in structure. Pulmonic valve regurgitation is not visualized. No evidence of pulmonic stenosis. Aorta: The aortic root is normal in size and structure and aortic dilatation noted. There is moderate dilatation of the ascending aorta, measuring 44 mm. Venous: The inferior vena cava is normal in size with greater than 50% respiratory variability, suggesting right atrial pressure of 3 mmHg. IAS/Shunts: No atrial level shunt detected by color flow Doppler.  LEFT VENTRICLE PLAX 2D LVIDd:         5.60 cm      Diastology LVIDs:         5.00 cm      LV e' lateral:   3.26 cm/s LV PW:         1.10 cm      LV E/e' lateral:  9.2 LV IVS:        1.10 cm LVOT diam:     2.00 cm LV SV:         50 LV SV Index:   23 LVOT Area:     3.14 cm  LV Volumes (MOD) LV vol d, MOD A4C: 187.0 ml LV vol s, MOD A4C: 142.0 ml LV SV MOD A4C:     187.0 ml RIGHT VENTRICLE             IVC RV S prime:     11.20 cm/s  IVC diam: 0.70 cm TAPSE (M-mode): 1.3 cm LEFT ATRIUM             Index       RIGHT ATRIUM           Index LA diam:        4.10 cm 1.86 cm/m  RA Area:     16.70 cm LA Vol (A2C):   64.7 ml 29.38 ml/m RA Volume:   41.30 ml  18.75 ml/m LA Vol (A4C):   76.7 ml 34.83 ml/m LA Biplane Vol: 71.6 ml 32.51 ml/m  AORTIC VALVE LVOT Vmax:   94.40 cm/s LVOT Vmean:  61.500 cm/s LVOT VTI:    0.160 m AI PHT:      595 msec  AORTA Ao Root diam: 3.80 cm Ao Asc diam:  4.35 cm MV E velocity: 30.00 cm/s MV A velocity: 82.60 cm/s  SHUNTS MV E/A ratio:  0.36        Systemic VTI:  0.16 m  Systemic Diam: 2.00 cm Jenkins Rouge MD Electronically signed by Jenkins Rouge MD Signature Date/Time: 02/04/2021/3:15:48 PM    Final     Cardiac Studies     Patient Profile     60 y.o. male admitted with nausea and acute kidney failure.  His renal function has improved with hydration.  He also has a history of hypertension, CHF.  Assessment & Plan    1.  Acute on chronic combined systolic and diastolic congestive heart failure:  Pt is feeling well . Scheduled for cath today      2.  Hypertension:  BP has improved.  Still elevated.  Will be more aggressive with titrating up his meds after his cath  .  3.  Nonsustained ventricular tachycardia: He is having fewer episodes of nonsustained VT. On  carvedilol to 12.5 mg twice a day.       For questions or updates, please contact [place] Please consult [email or web address] for contact info under        Signed, Mertie Moores, MD  02/06/2021, 1:47 PM

## 2021-02-06 NOTE — Interval H&P Note (Signed)
Cath Lab Visit (complete for each Cath Lab visit)  Clinical Evaluation Leading to the Procedure:   ACS: No.  Non-ACS:    Anginal Classification: CCS II  Anti-ischemic medical therapy: Maximal Therapy (2 or more classes of medications)  Non-Invasive Test Results: No non-invasive testing performed  Prior CABG: No previous CABG      History and Physical Interval Note:  02/06/2021 11:34 AM  Darius Rivers  has presented today for surgery, with the diagnosis of heart failure.  The various methods of treatment have been discussed with the patient and family. After consideration of risks, benefits and other options for treatment, the patient has consented to  Procedure(s): RIGHT/LEFT HEART CATH AND CORONARY ANGIOGRAPHY (N/A) as a surgical intervention.  The patient's history has been reviewed, patient examined, no change in status, stable for surgery.  I have reviewed the patient's chart and labs.  Questions were answered to the patient's satisfaction.     Shelva Majestic

## 2021-02-07 ENCOUNTER — Encounter (HOSPITAL_COMMUNITY): Payer: Self-pay | Admitting: Cardiovascular Disease

## 2021-02-07 DIAGNOSIS — N1831 Chronic kidney disease, stage 3a: Secondary | ICD-10-CM

## 2021-02-07 DIAGNOSIS — N179 Acute kidney failure, unspecified: Secondary | ICD-10-CM | POA: Diagnosis not present

## 2021-02-07 DIAGNOSIS — E785 Hyperlipidemia, unspecified: Secondary | ICD-10-CM

## 2021-02-07 DIAGNOSIS — I5042 Chronic combined systolic (congestive) and diastolic (congestive) heart failure: Secondary | ICD-10-CM | POA: Diagnosis not present

## 2021-02-07 LAB — CBC
HCT: 36.9 % — ABNORMAL LOW (ref 39.0–52.0)
Hemoglobin: 12.7 g/dL — ABNORMAL LOW (ref 13.0–17.0)
MCH: 33.7 pg (ref 26.0–34.0)
MCHC: 34.4 g/dL (ref 30.0–36.0)
MCV: 97.9 fL (ref 80.0–100.0)
Platelets: 292 10*3/uL (ref 150–400)
RBC: 3.77 MIL/uL — ABNORMAL LOW (ref 4.22–5.81)
RDW: 13.8 % (ref 11.5–15.5)
WBC: 10.7 10*3/uL — ABNORMAL HIGH (ref 4.0–10.5)
nRBC: 0 % (ref 0.0–0.2)

## 2021-02-07 LAB — BASIC METABOLIC PANEL
Anion gap: 6 (ref 5–15)
BUN: 21 mg/dL — ABNORMAL HIGH (ref 6–20)
CO2: 24 mmol/L (ref 22–32)
Calcium: 8.8 mg/dL — ABNORMAL LOW (ref 8.9–10.3)
Chloride: 103 mmol/L (ref 98–111)
Creatinine, Ser: 1.35 mg/dL — ABNORMAL HIGH (ref 0.61–1.24)
GFR, Estimated: >60 mL/min (ref >60)
Glucose, Bld: 110 mg/dL — ABNORMAL HIGH (ref 70–99)
Potassium: 4.3 mmol/L (ref 3.5–5.1)
Sodium: 133 mmol/L — ABNORMAL LOW (ref 135–145)

## 2021-02-07 MED ORDER — SPIRONOLACTONE 25 MG PO TABS
25.0000 mg | ORAL_TABLET | Freq: Every day | ORAL | Status: DC
  Administered 2021-02-07: 25 mg via ORAL
  Filled 2021-02-07: qty 1

## 2021-02-07 MED ORDER — ISOSORBIDE MONONITRATE ER 60 MG PO TB24
60.0000 mg | ORAL_TABLET | Freq: Every day | ORAL | Status: DC
  Administered 2021-02-07: 60 mg via ORAL
  Filled 2021-02-07: qty 1

## 2021-02-07 MED ORDER — ROSUVASTATIN CALCIUM 40 MG PO TABS: 40.0000 mg | ORAL_TABLET | Freq: Every day | ORAL | 0 refills | Status: DC

## 2021-02-07 MED ORDER — SACUBITRIL-VALSARTAN 49-51 MG PO TABS: 1.0000 | ORAL_TABLET | Freq: Two times a day (BID) | ORAL | 0 refills | Status: DC

## 2021-02-07 MED ORDER — ISOSORBIDE MONONITRATE ER 60 MG PO TB24: 60.0000 mg | ORAL_TABLET | Freq: Every day | ORAL | 0 refills | Status: DC

## 2021-02-07 MED ORDER — HYDRALAZINE HCL 25 MG PO TABS
25.0000 mg | ORAL_TABLET | Freq: Three times a day (TID) | ORAL | Status: DC
  Administered 2021-02-07 (×2): 25 mg via ORAL
  Filled 2021-02-07 (×2): qty 1

## 2021-02-07 MED ORDER — CARVEDILOL 25 MG PO TABS: 25.0000 mg | ORAL_TABLET | Freq: Two times a day (BID) | ORAL | Status: DC

## 2021-02-07 MED ORDER — CARVEDILOL 25 MG PO TABS: 25.0000 mg | ORAL_TABLET | Freq: Two times a day (BID) | ORAL | 0 refills | Status: DC

## 2021-02-07 MED ORDER — SACUBITRIL-VALSARTAN 49-51 MG PO TABS
1.0000 | ORAL_TABLET | Freq: Two times a day (BID) | ORAL | Status: DC
  Administered 2021-02-07: 1 via ORAL
  Filled 2021-02-07 (×2): qty 1

## 2021-02-07 MED ORDER — CARVEDILOL 12.5 MG PO TABS
12.5000 mg | ORAL_TABLET | Freq: Once | ORAL | Status: AC
  Administered 2021-02-07: 12.5 mg via ORAL
  Filled 2021-02-07: qty 1

## 2021-02-07 MED ORDER — HYDRALAZINE HCL 25 MG PO TABS: 25.0000 mg | ORAL_TABLET | Freq: Three times a day (TID) | ORAL | 0 refills | Status: DC

## 2021-02-07 MED ORDER — SPIRONOLACTONE 25 MG PO TABS: 25.0000 mg | ORAL_TABLET | Freq: Every day | ORAL | 0 refills | Status: DC

## 2021-02-07 MED FILL — Heparin Sod (Porcine)-NaCl IV Soln 1000 Unit/500ML-0.9%: INTRAVENOUS | Qty: 500 | Status: AC

## 2021-02-07 NOTE — Progress Notes (Signed)
Central telemetry monitoring has reported that this patient has had several runs of non-sustained V-tach.  2128 patient had 4 beats , 2251 patient had 11 beats, and 0445 patient had 9 beats.  Patient has been asymptomatic each time.  Patient states "that each time his monitor has shown V-tach that he is having hiccups.  Patient does not complain of any shortness of breath, dizziness, or any feeling of being light headed, no complains of pain.  MD notified, will continue to monitor.   Donah Driver, RN

## 2021-02-07 NOTE — Discharge Instructions (Signed)
Contact [facility] office if you have palpitations, feel your heart skip or race, or you get light-headed or dizzy.  Heart Failure, Diagnosis  Heart failure means that your heart is not able to pump blood in the right way. This makes it hard for your body to work well. Heart failure is usually a long-term (chronic) condition. You must take good care of yourself and follow your treatment plan from your doctor. What are the causes?  High blood pressure.  Buildup of cholesterol and fat in the arteries.  Heart attack. This injures the heart muscle.  Heart valves that do not open and close properly.  Damage of the heart muscle. This is also called cardiomyopathy.  Infection of the heart muscle. This is also called myocarditis.  Lung disease. What increases the risk?  Getting older. The risk of heart failure goes up as a person ages.  Being overweight.  Being male.  Use tobacco or nicotine products.  Abusing alcohol or drugs.  Having taken medicines that can damage the heart.  Having any of these conditions: ? Diabetes. ? Abnormal heart rhythms. ? Thyroid problems. ? Low blood counts (anemia).  Having a family history of heart failure. What are the signs or symptoms?  Shortness of breath.  Coughing.  Swelling of the feet, ankles, legs, or belly.  Losing or gaining weight for no reason.  Trouble breathing.  Waking from sleep because of the need to sit up and get more air.  Fast heartbeat.  Being very tired.  Feeling dizzy, or feeling like you may pass out (faint).  Having no desire to eat.  Feeling like you may vomit (nauseous).  Peeing (urinating) more at night.  Feeling confused. How is this treated? This condition may be treated with:  Medicines. These can be given to treat blood pressure and to make the heart muscles stronger.  Changes in your daily life. These may include: ? Eating a healthy diet. ? Staying at a healthy body weight. ? Quitting  tobacco, alcohol, and drug use. ? Doing exercises. ? Participating in a cardiac rehabilitation program. This program helps you improve your health through exercise, education, and counseling.  Surgery. Surgery can be done to open blocked valves, or to put devices in the heart, such as pacemakers.  A donor heart (heart transplant). You will receive a healthy heart from a donor. Follow these instructions at home:  Treat other conditions as told by your doctor. These may include high blood pressure, diabetes, thyroid disease, or abnormal heart rhythms.  Learn as much as you can about heart failure.  Get support as you need it.  Keep all follow-up visits. Summary  Heart failure means that your heart is not able to pump blood in the right way.  This condition is often caused by high blood pressure, heart attack, or damage of the heart muscle.  Symptoms of this condition include shortness of breath and swelling of the feet, ankles, legs, or belly. You may also feel very tired or feel like you may vomit.  You may be treated with medicines, surgery, or changes in your daily life.  Treat other health conditions as told by your doctor. This information is not intended to replace advice given to you by your health care provider. Make sure you discuss any questions you have with your health care provider. Document Revised: 05/12/2020 Document Reviewed: 05/12/2020 Elsevier Patient Education  2021 [place]. Acute Kidney Injury, Adult  Acute kidney injury is a sudden worsening of  kidney function. The kidneys are organs that have several jobs. They filter the blood to remove waste products and extra fluid. They also maintain a healthy balance of minerals and hormones in the body, which helps control blood pressure and keep bones strong. With this condition, your kidneys do not do their jobs as well as they should. This condition ranges from mild to severe. Over time, it may develop into  long-lasting (chronic) kidney disease. Early detection and treatment may prevent acute kidney injury from developing into a chronic condition. What are the causes? Common causes of this condition include:  A problem with blood flow to the kidneys. This may be caused by: ? Low blood pressure (hypotension) or shock. ? Blood loss. ? Heart and blood vessel (cardiovascular) disease. ? Severe burns. ? Liver disease.  Direct damage to the kidneys. This may be caused by: ? Certain medicines. ? A kidney infection. ? Poisoning. ? Being around or in contact with toxic substances. ? A surgical wound. ? A hard, direct hit to the kidney area.  A sudden blockage of urine flow. This may be caused by: ? Cancer. ? Kidney stones. ? An enlarged prostate in males. What increases the risk? You are more likely to develop this condition if you:  Are older than age 45.  Are male.  Are hospitalized, especially if you are in critical condition.  Have certain conditions, such as: ? Chronic kidney disease. ? Diabetes. ? Coronary artery disease and heart failure. ? Pulmonary disease. ? Chronic liver disease. What are the signs or symptoms? Symptoms of this condition may not be obvious until the condition becomes severe. Symptoms of this condition can include:  Tiredness (lethargy) or difficulty staying awake.  Nausea or vomiting.  Swelling (edema) of the face, legs, ankles, or feet.  Problems with urination, such as: ? Pain in the abdomen, or pain along the side of your stomach (flank). ? Producing little or no urine. ? Passing urine with a weak flow.  Muscle twitches and cramps, especially in the legs.  Confusion or trouble concentrating.  Loss of appetite.  Fever. How is this diagnosed? Your health care provider can diagnose this condition based on your symptoms, medical history, and a physical exam.  You may also have other tests, such as:  Blood tests.  Urine  tests.  Imaging tests.  A test in which a sample of tissue is removed from the kidneys to be examined under a microscope (kidney biopsy). How is this treated? Treatment for this condition depends on the cause and how severe the condition is. In mild cases, treatment may not be needed. The kidneys may heal on their own. In more severe cases, treatment will involve:  Treating the cause of the kidney injury. This may involve changing any medicines you are taking or adjusting your dosage.  Fluids. You may need specialized IV fluids to balance your body's needs.  Having a catheter placed to drain urine and prevent blockages.  Preventing problems from occurring. This may mean avoiding certain medicines or procedures that can cause further injury to the kidneys. In some cases, treatment may also require:  A procedure to remove toxic wastes from the body (dialysis or continuous renal replacement therapy, CRRT).  Surgery. This may be done to repair a torn kidney or to remove the blockage from the urinary system. Follow these instructions at home: Medicines  Take over-the-counter and prescription medicines only as told by your health care provider.  Do not take any new  medicines without your health care provider's approval. Many medicines can worsen your kidney damage.  Do not take any vitamin and mineral supplements without your health care provider's approval. Many nutritional supplements can worsen your kidney damage. Lifestyle  If your health care provider prescribed changes to your diet, follow them. You may need to decrease the amount of protein you eat.  Achieve and maintain a healthy weight. If you need help with this, ask your health care provider.  Start or continue an exercise plan. Try to exercise at least 30 minutes a day, 5 days a week.  Do not use any products that contain nicotine or tobacco, such as cigarettes, e-cigarettes, and chewing tobacco. If you need help quitting,  ask your health care provider.   General instructions  Keep track of your blood pressure. Report changes in your blood pressure as told by your health care provider.  Stay up to date with your vaccines. Ask your health care provider which vaccines you need.  Keep all follow-up visits as told by your health care provider. This is important.   Where to find more information  American Association of Kidney Patients: BombTimer.gl  National Kidney Foundation: www.kidney.[place]: https://mathis.com/  [facility]: ? www.lifeoptions.org ? www.kidneyschool.org Contact a health care provider if:  Your symptoms get worse.  You develop new symptoms. Get help right away if:  You develop symptoms of worsening kidney disease, which include: ? Headaches. ? Abnormally dark or light skin. ? Easy bruising. ? Frequent hiccups. ? Chest pain. ? Shortness of breath. ? End of menstruation in women. ? Seizures. ? Confusion or altered mental status. ? Abdominal or back pain. ? Itchiness.  You have a fever.  Your body is producing less urine.  You have pain or bleeding when you urinate. Summary  Acute kidney injury is a sudden worsening of kidney function.  Acute kidney injury can be caused by problems with blood flow to the kidneys, direct damage to the kidneys, and sudden blockage of urine flow.  Symptoms of this condition may not be obvious until it becomes severe. Symptoms may include edema, lethargy, confusion, nausea or vomiting, and problems passing urine.  This condition can be diagnosed with blood tests, urine tests, and imaging tests. Sometimes a kidney biopsy is done to diagnose this condition.  Treatment for this condition often involves treating the underlying cause. It is treated with fluids, medicines, diet changes, dialysis, or surgery. This information is not intended to replace advice given to you by your health care provider. Make  sure you discuss any questions you have with your health care provider. Document Revised: 08/30/2019 Document Reviewed: 08/30/2019 Elsevier Patient Education  2021 [employer].

## 2021-02-07 NOTE — Progress Notes (Addendum)
   Called by RN re: NSVT  Pt having 7-8 bt runs of VT, asymptomatic  Per Dr Elmarie Shiley note, I increased his Coreg to 25 mg bid and gave him a one-time supplemental dose of 12.5 mg.  Discussed w/ Dr Acie Fredrickson, since runs are short and he is asymptomatic, increase BB, but no need to delay discharge because of this.  He should contact us for sx, he has f/u appt arranged.  Lab Results  Component Value Date   NA 133 (L) 02/07/2021   K 4.3 02/07/2021   CO2 24 02/07/2021   GLUCOSE 110 (H) 02/07/2021   BUN 21 (H) 02/07/2021   CREATININE 1.35 (H) 02/07/2021   CALCIUM 8.8 (L) 02/07/2021   GFRNONAA >60 02/07/2021   GFRAA 88 12/07/2018   Magnesium  Date Value Ref Range Status  02/06/2021 2.1 1.7 - 2.4 mg/dL Final    Comment:    Performed at [facility], [place] [address], [place], [place] [address]    Rosaria Ferries, PA-C 02/07/2021 1:31 PM

## 2021-02-07 NOTE — Progress Notes (Signed)
Progress Note  Patient Name: Darius Rivers Date of Encounter: 02/07/2021  [facility] Cardiologist: Pixie Casino, MD    Subjective   60 year old gentleman with history of hypertension, hyperlipidemia, congestive heart failure. He was admitted with episodes of palpitations and nausea.  He is found to have acute renal failure which has improved with IV fluids. He is also been found to have episodes of nonsustained ventricular tachycardia as well as acute deterioration in his LV function.    Cath revealed normal coronaries.  Normal / low filling pressures.  Feels great  BP is still elevated . i've increased entresto, hydralazine, imdur He may be able to go home today  I would DC on Coreg 25 BID also   Inpatient Medications    Scheduled Meds: . aspirin EC  81 mg Oral Daily  . carvedilol  12.5 mg Oral BID WC  . docusate sodium  100 mg Oral BID  . enoxaparin (LOVENOX) injection  40 mg Subcutaneous Q24H  . feeding supplement  237 mL Oral BID BM  . hydrALAZINE  25 mg Oral Q8H  . isosorbide mononitrate  60 mg Oral Daily  . rosuvastatin  40 mg Oral q1800  . sacubitril-valsartan  1 tablet Oral BID  . sodium chloride flush  3 mL Intravenous Q12H  . sodium chloride flush  3 mL Intravenous Q12H  . spironolactone  12.5 mg Oral Daily   Continuous Infusions: . sodium chloride    . sodium chloride Stopped (02/03/21 1918)   PRN Meds: sodium chloride, acetaminophen, bisacodyl, diazepam, HYDROcodone-acetaminophen, morphine injection, ondansetron (ZOFRAN) IV, polyethylene glycol, prochlorperazine, sodium chloride flush   Vital Signs    Vitals:   02/06/21 1556 02/06/21 2110 02/07/21 0117 02/07/21 0427  BP: 94/66 (!) 159/97 (!) 124/91 (!) 170/96  Pulse: 76 71 75 72  Resp: 18 18 20 17   Temp: 98.2 F (36.8 C) 98.4 F (36.9 C) 98.6 F (37 C) 98.2 F (36.8 C)  TempSrc: Oral Oral Oral Oral  SpO2: 99% 100% 100% 100%  Weight:    82.2 kg  Height:        Intake/Output Summary  (Last 24 hours) at 02/07/2021 0854 Last data filed at 02/07/2021 0430 Gross per 24 hour  Intake 302.67 ml  Output 500 ml  Net -197.33 ml   Last 3 Weights 02/07/2021 02/06/2021 02/05/2021  Weight (lbs) 181 lb 3.2 oz 179 lb 14.4 oz 181 lb 8 oz  Weight (kg) 82.192 kg 81.602 kg 82.328 kg      Telemetry    NSR   ECG     - Personally Reviewed  Physical Exam   Physical Exam: Blood pressure (!) 170/96, pulse 72, temperature 98.2 F (36.8 C), temperature source Oral, resp. rate 17, height 6\' 4"  (1.93 m), weight 82.2 kg, SpO2 100 %.  GEN:   Middle age male, NAD  HEENT: Normal NECK: No JVD; No carotid bruits LYMPHATICS: No lymphadenopathy CARDIAC: RRR , no murmurs, rubs, gallops RESPIRATORY:  Clear to auscultation without rales, wheezing or rhonchi  ABDOMEN: Soft, non-tender, non-distended MUSCULOSKELETAL:  No edema; No deformity  SKIN: Warm and dry NEUROLOGIC:  Alert and oriented x 3   Labs    High Sensitivity Troponin:   Recent Labs  Lab 02/03/21 1600 02/03/21 1825  TROPONINIHS 78* 65*      Chemistry Recent Labs  Lab 02/03/21 1600 02/04/21 0633 02/05/21 0346 02/06/21 0235 02/06/21 1230 02/06/21 1237 02/06/21 1354 02/07/21 0309  NA 132*   < > 133* 133* 136  136 136  --  133*  K 3.1*   < > 3.3* 4.0 4.1  4.1 4.1  --  4.3  CL 97*   < > 101 103  --   --   --  103  CO2 25   < > 24 23  --   --   --  24  GLUCOSE 154*   < > 104* 100*  --   --   --  110*  BUN 29*   < > 17 19  --   --   --  21*  CREATININE 2.06*   < > 1.31* 1.40*  --   --  1.42* 1.35*  CALCIUM 9.0   < > 9.0 9.0  --   --   --  8.8*  PROT 7.7  --   --   --   --   --   --   --   ALBUMIN 4.0  --   --   --   --   --   --   --   AST 19  --   --   --   --   --   --   --   ALT 13  --   --   --   --   --   --   --   ALKPHOS 51  --   --   --   --   --   --   --   BILITOT 0.7  --   --   --   --   --   --   --   GFRNONAA 37*   < > >60 59*  --   --  58* >60  ANIONGAP 10   < > 8 7  --   --   --  6   < > = values in  this interval not displayed.     Hematology Recent Labs  Lab 02/06/21 0235 02/06/21 1230 02/06/21 1237 02/06/21 1354 02/07/21 0309  WBC 10.0  --   --  9.0 10.7*  RBC 4.08*  --   --  3.85* 3.77*  HGB 13.6   < > 12.6* 12.9* 12.7*  HCT 39.6   < > 37.0* 38.3* 36.9*  MCV 97.1  --   --  99.5 97.9  MCH 33.3  --   --  33.5 33.7  MCHC 34.3  --   --  33.7 34.4  RDW 13.6  --   --  13.7 13.8  PLT 330  --   --  324 292   < > = values in this interval not displayed.    BNP Recent Labs  Lab 02/03/21 1600  BNP 107.4*     DDimer No results for input(s): DDIMER in the last 168 hours.   Radiology    CARDIAC CATHETERIZATION  Result Date: 02/06/2021  Mid LAD lesion is 15% stenosed.  Very low right heart pressures. No significant coronary obstructive disease with very large LAD with minimal 15% narrowing after a large bifurcating first diagonal vessel and an LAD that wraps around the apex to supply the distal third of the inferior wall; all nondominant left circumflex vessel; large dominant normal RCA. RECOMMENDATION: Findings are consistent with a nonischemic cardiomyopathy.  Right heart pressures are extremely low.  Will gently hydrate post procedure in this patient with EF of 25 to 30%.    Cardiac Studies     Patient Profile     60 y.o.  male admitted with nausea and acute kidney failure.  His renal function has improved with hydration.  He also has a history of hypertension, CHF.  Assessment & Plan    1.  Acute on chronic combined systolic and diastolic congestive heart failure:   normal coronaries on cath. Normal / low filling pressures  Continue to uptitrate his CHF meds  He may be able to go home today   Ive increased his entresto to 49-51 BID Hydralazine 25 TID imdur 60 mg a day  Spironolactone increased to 25 mg a day  His filling pressures are low At this point, I do not think he needs a loop diuretic    2.  Hypertension:  Gradually increasing his meds as listed  above.   3.  Nonsustained ventricular tachycardia: He is having fewer episodes of nonsustained VT. Increase coreg to 25 BID tomorrow ( or increase today if he is going home today        For questions or updates, please contact [place] Please consult [email or web address] for contact info under        Signed, Mertie Moores, MD  02/07/2021, 8:54 AM

## 2021-02-07 NOTE — Progress Notes (Signed)
Received an Email from patient's [place] regarding the patient having a prescription for Lisinopril which was picked up by the patient less than 30 days ago.  Lisinopril was not listed on home medications list.  [place] to clarify that the patient is not to take Lisinopril.  Also called the patient and informed him via phone not to take Lisinopril and to only take the medications that have been recommended by cardiology during this admission.  Patient understood and agreed with plan.  He was able to verbalize back what he understood.

## 2021-02-07 NOTE — Discharge Summary (Addendum)
Discharge Summary  Darius Rivers JEH:[id number] DOB: 1961-06-07  PCP: Fortino Sic, PA  Admit date: 02/03/2021 Discharge date: 02/07/2021  Time spent: 35 minutes.  Recommendations for Outpatient Follow-up:  1. Follow-up with cardiology, keep appointment on 03/06/2021. 2. Follow-up with your primary care provider in 1 to 2 weeks. 3. Take your medications as prescribed.  Discharge Diagnoses:  Active Hospital Problems   Diagnosis Date Noted  . AKI (acute kidney injury) ([place]) 11/11/2018  . Essential hypertension 02/04/2021  . AAA (abdominal aortic aneurysm) ([place]) 02/04/2021  . H/O prostate cancer 02/04/2021  . Prolonged QT interval 02/03/2021  . NSVT (nonsustained ventricular tachycardia) ([place]) 11/14/2018  . Hyperlipidemia 11/11/2018  . CKD (chronic kidney disease), stage III ([place]) 11/11/2018  . Chronic combined systolic and diastolic CHF (congestive heart failure) ([place]) 11/11/2018    Resolved Hospital Problems  No resolved problems to display.    Discharge Condition: Stable.  Diet recommendation: Heart healthy diet.  Vitals:   02/07/21 0427 02/07/21 1027  BP: (!) 170/96 115/83  Pulse: 72   Resp: 17   Temp: 98.2 F (36.8 C)   SpO2: 100%     History of present illness:  60 year old gentleman with history of hypertension, chronic combined congestive heart failure, hyperlipidemia, history of NSVT, chronic kidney disease stage II, prostate cancer presented to the emergency room with episode of nausea and vomiting after eating a strawberry shortcake at home. He had continued symptoms for about 2 days so came to the ER. Denies any chest pain or palpitations. In the emergency room, he was with low blood pressures, mildly elevated troponins, elevated creatinine, hypokalemia. Admitted to [facility] with cardiology consultation.  2D echo done on 02/04/2021 revealed worsened LVEF 25 to 30% from 40 to 45% in 2020.  He was taken to the Cath Lab on 02/06/2021 which  revealed normal coronaries.    Cardiac medications adjusted by cardiology with recommendations for: -Entresto 49-51 twice daily -Hydralazine 25 mg 3 times daily -Imdur 60 mg daily -Spironolactone 25 mg daily -Coreg 25 mg twice daily  Hospital course complicated by asymptomatic nonsustained runs of V. tach.  Serum potassium and magnesium optimized.  Serum potassium 4.3, serum magnesium 2.1.  Coreg dose was increased to 25 mg twice daily.  02/07/21:  Patient was seen in his bedside.  He has no new complaints.  He denies any chest pain, dyspnea or palpitations.  Counseled on the importance of medication compliance.  Patient understands and agrees with plan.  Hospital Course:  Principal Problem:   AKI (acute kidney injury) ([place]) Active Problems:   Hyperlipidemia   Chronic combined systolic and diastolic CHF (congestive heart failure) (H[facility])   CKD (chronic kidney disease), stage III (H[facility])   NSVT (nonsustained ventricular tachycardia) (H[facility])   Prolonged QT interval   Essential hypertension   AAA (abdominal aortic aneurysm) ([place])   H/O prostate cancer  Worsening chronic combined congestive heart failure: 2D echocardiogram 11/12/18 revealed LVEF 40 to 45%, diffuse hypokinesis and grade 1 diastolic dysfunction.   2D echocardiogram 02/04/21 revealed LVEF 25 to 30%  Euvolemic on exam. Currently on aspirin, Entresto, Aldactone, carvedilol, Imdur, and hydralazine.  Follow-up with cardiology, keep appointment on 03/06/2021.  Resolved acute kidney injury with history of chronic kidney disease stage II:  Presented with creatinine of 2.06 with GFR of 37. Baseline creatinine about 1.34 with GFR greater than 60. Hypovolemic on presentation.  Received gentle IV fluid hydration. Currently euvolemic on exam. Creatinine back to baseline 1.35 with GFR greater than 60.  Follow-up with your PCP in 1 to 2 weeks.  Asymptomatic nonsustained runs of V. Tach. Cardiology made aware. Coreg was increased to 25 mg  twice daily by cardiology. Per cardiology no need to delay discharge Keep follow-up appointment arranged by cardiology.  Hypertension:  BP stable Medications continued as recommended by cardiology. Follow-up with cardiology within a week.  Hyperlipidemia:  On Crestor 40 mg nightly.  Resolved post repletion: Hypokalemia:  Serum potassium 4.3 Magnesium 2.1.    Code Status:Full code     Consultants:  Cardiology  Procedures:  None  Antimicrobials:  None    Discharge Exam: BP 115/83   Pulse 72   Temp 98.2 F (36.8 C) (Oral)   Resp 17   Ht 6\' 4"  (1.93 m)   Wt 82.2 kg   SpO2 100%   BMI 22.06 kg/m  . General: 60 y.o. year-old male well developed well nourished in no acute distress.  Alert and oriented x3. . Cardiovascular: Regular rate and rhythm with no rubs or gallops.  No thyromegaly or JVD noted.   Marland Kitchen Respiratory: Clear to auscultation with no wheezes or rales. Good inspiratory effort. . Abdomen: Soft nontender nondistended with normal bowel sounds x4 quadrants. . Musculoskeletal: No lower extremity edema. 2/4 pulses in all 4 extremities. . Skin: No ulcerative lesions noted or rashes, . Psychiatry: Mood is appropriate for condition and setting  Discharge Instructions You were cared for by a hospitalist during your hospital stay. If you have any questions about your discharge medications or the care you received while you were in the hospital after you are discharged, you can call the unit and asked to speak with the hospitalist on call if the hospitalist that took care of you is not available. Once you are discharged, your primary care physician will handle any further medical issues. Please note that NO REFILLS for any discharge medications will be authorized once you are discharged, as it is imperative that you return to your primary care physician (or establish a relationship with a primary care physician if you do not have one) for your  aftercare needs so that they can reassess your need for medications and monitor your lab values.   Allergies as of 02/07/2021   No Known Allergies     Medication List    STOP taking these medications   amLODipine 10 MG tablet Commonly known as: NORVASC   furosemide 40 MG tablet Commonly known as: LASIX   Vitamin D (Ergocalciferol) 1.25 MG (50000 UNIT) Caps capsule Commonly known as: DRISDOL     TAKE these medications   aspirin EC 81 MG tablet Take 81 mg by mouth daily.   carvedilol 25 MG tablet Commonly known as: COREG Take 1 tablet (25 mg total) by mouth 2 (two) times daily with a meal. What changed:   medication strength  See the new instructions.   hydrALAZINE 25 MG tablet Commonly known as: APRESOLINE Take 1 tablet (25 mg total) by mouth every 8 (eight) hours.   isosorbide mononitrate 60 MG 24 hr tablet Commonly known as: IMDUR Take 1 tablet (60 mg total) by mouth daily. Start taking on: February 08, 2021 What changed:   medication strength  how much to take   rosuvastatin 40 MG tablet Commonly known as: CRESTOR Take 1 tablet (40 mg total) by mouth daily at 6 PM. What changed: how much to take   sacubitril-valsartan 49-51 MG Commonly known as: ENTRESTO Take 1 tablet by mouth 2 (two) times daily.   spironolactone  25 MG tablet Commonly known as: ALDACTONE Take 1 tablet (25 mg total) by mouth daily. Start taking on: February 08, 2021      No Known Allergies  Follow-up Information    Alphonzo Severance [place], [place]. Call in 1 day(s).   Specialty: Family Medicine Why: Please call for a post hospital follow-up appointment. Contact information: [place] [address] [phone]        Pixie Casino, MD Follow up.   Specialty: Cardiology Why: Keep f/u appt 03/06/2021 w/ Richardson Dopp, [facility]  Contact information: [address] [place] [address] [phone]                The results of  significant diagnostics from this hospitalization (including imaging, microbiology, ancillary and laboratory) are listed below for reference.    Significant Diagnostic Studies: DG Chest 1 View  Result Date: 02/03/2021 CLINICAL DATA:  Nausea. EXAM: CHEST  1 VIEW COMPARISON:  November 10, 2018 FINDINGS: There is stable prominence of the mediastinum in the region of the ascending thoracic aorta secondary to the patient's known thoracic aortic aneurysm. The heart size and mediastinal contours are otherwise within normal limits. Both lungs are clear. The visualized skeletal structures are unremarkable. IMPRESSION: 1. Stable exam without evidence of active cardiopulmonary disease. Electronically Signed   By: Virgina Norfolk M.D.   On: 02/03/2021 17:17   CARDIAC CATHETERIZATION  Result Date: 02/06/2021  Mid LAD lesion is 15% stenosed.  Very low right heart pressures. No significant coronary obstructive disease with very large LAD with minimal 15% narrowing after a large bifurcating first diagonal vessel and an LAD that wraps around the apex to supply the distal third of the inferior wall; all nondominant left circumflex vessel; large dominant normal RCA. RECOMMENDATION: Findings are consistent with a nonischemic cardiomyopathy.  Right heart pressures are extremely low.  Will gently hydrate post procedure in this patient with EF of 25 to 30%.   ECHOCARDIOGRAM COMPLETE  Result Date: 02/04/2021    ECHOCARDIOGRAM REPORT   Patient Name:   BRACEN SCHUM Date of Exam: 02/04/2021 Medical Rec #:  [id number]        Height:       76.5 in Accession #:    [id number]       Weight:       195.0 lb Date of Birth:  1961-07-01         BSA:          2.202 m Patient Age:    60 years         BP:           184/111 mmHg Patient Gender: M                HR:           82 bpm. Exam Location:  Inpatient Procedure: 2D Echo Indications:    ventricular tachycardia  History:        Patient has prior history of Echocardiogram examinations, most                  recent 11/12/2018. Chronic kidney disease; Risk                 Factors:Dyslipidemia.  Sonographer:    Johny Chess Referring Phys: [place]  1. Diffuse hypokinesis worse in the inferior base . Left ventricular ejection fraction, by estimation, is 25 to 30%. The left ventricle has severely decreased function. The  left ventricle has no regional wall motion abnormalities. The left ventricular internal cavity size was moderately dilated. There is mild left ventricular hypertrophy. Left ventricular diastolic parameters were normal.  2. Right ventricular systolic function is normal. The right ventricular size is normal.  3. Left atrial size was mildly dilated.  4. The mitral valve is normal in structure. Trivial mitral valve regurgitation. No evidence of mitral stenosis.  5. The aortic valve was not well visualized. There is moderate calcification of the aortic valve. Aortic valve regurgitation is moderate. Mild to moderate aortic valve sclerosis/calcification is present, without any evidence of aortic stenosis.  6. Aortic dilatation noted. There is moderate dilatation of the ascending aorta, measuring 44 mm.  7. The inferior vena cava is normal in size with greater than 50% respiratory variability, suggesting right atrial pressure of 3 mmHg. FINDINGS  Left Ventricle: Diffuse hypokinesis worse in the inferior base. Left ventricular ejection fraction, by estimation, is 25 to 30%. The left ventricle has severely decreased function. The left ventricle has no regional wall motion abnormalities. Global longitudinal strain performed but not reported based on interpreter judgement due to suboptimal tracking. 3D left ventricular ejection fraction analysis performed but not reported based on interpreter judgement due to suboptimal quality. The left ventricular internal cavity size was moderately dilated. There is mild left ventricular hypertrophy. Left ventricular diastolic parameters  were normal. Right Ventricle: The right ventricular size is normal. No increase in right ventricular wall thickness. Right ventricular systolic function is normal. Left Atrium: Left atrial size was mildly dilated. Right Atrium: Right atrial size was normal in size. Pericardium: There is no evidence of pericardial effusion. Mitral Valve: The mitral valve is normal in structure. Trivial mitral valve regurgitation. No evidence of mitral valve stenosis. Tricuspid Valve: The tricuspid valve is normal in structure. Tricuspid valve regurgitation is mild . No evidence of tricuspid stenosis. Aortic Valve: The aortic valve was not well visualized. There is moderate calcification of the aortic valve. Aortic valve regurgitation is moderate. Aortic regurgitation PHT measures 595 msec. Mild to moderate aortic valve sclerosis/calcification is present, without any evidence of aortic stenosis. Pulmonic Valve: The pulmonic valve was normal in structure. Pulmonic valve regurgitation is not visualized. No evidence of pulmonic stenosis. Aorta: The aortic root is normal in size and structure and aortic dilatation noted. There is moderate dilatation of the ascending aorta, measuring 44 mm. Venous: The inferior vena cava is normal in size with greater than 50% respiratory variability, suggesting right atrial pressure of 3 mmHg. IAS/Shunts: No atrial level shunt detected by color flow Doppler.  LEFT VENTRICLE PLAX 2D LVIDd:         5.60 cm      Diastology LVIDs:         5.00 cm      LV e' lateral:   3.26 cm/s LV PW:         1.10 cm      LV E/e' lateral: 9.2 LV IVS:        1.10 cm LVOT diam:     2.00 cm LV SV:         50 LV SV Index:   23 LVOT Area:     3.14 cm  LV Volumes (MOD) LV vol d, MOD A4C: 187.0 ml LV vol s, MOD A4C: 142.0 ml LV SV MOD A4C:     187.0 ml RIGHT VENTRICLE             IVC RV S prime:  11.20 cm/s  IVC diam: 0.70 cm TAPSE (M-mode): 1.3 cm LEFT ATRIUM             Index       RIGHT ATRIUM           Index LA diam:         4.10 cm 1.86 cm/m  RA Area:     16.70 cm LA Vol (A2C):   64.7 ml 29.38 ml/m RA Volume:   41.30 ml  18.75 ml/m LA Vol (A4C):   76.7 ml 34.83 ml/m LA Biplane Vol: 71.6 ml 32.51 ml/m  AORTIC VALVE LVOT Vmax:   94.40 cm/s LVOT Vmean:  61.500 cm/s LVOT VTI:    0.160 m AI PHT:      595 msec  AORTA Ao Root diam: 3.80 cm Ao Asc diam:  4.35 cm MV E velocity: 30.00 cm/s MV A velocity: 82.60 cm/s  SHUNTS MV E/A ratio:  0.36        Systemic VTI:  0.16 m                            Systemic Diam: 2.00 cm Jenkins Rouge MD Electronically signed by Jenkins Rouge MD Signature Date/Time: 02/04/2021/3:15:48 PM    Final     Microbiology: Recent Results (from the past 240 hour(s))  Resp Panel by RT-PCR (Flu A&B, Covid) Nasopharyngeal Swab     Status: None   Collection Time: 02/03/21  5:08 PM   Specimen: Nasopharyngeal Swab; Nasopharyngeal(NP) swabs in vial transport medium  Result Value Ref Range Status   SARS Coronavirus 2 by RT PCR NEGATIVE NEGATIVE Final    Comment: (NOTE) SARS-CoV-2 target nucleic acids are NOT DETECTED.  The SARS-CoV-2 RNA is generally detectable in upper respiratory specimens during the acute phase of infection. The lowest concentration of SARS-CoV-2 viral copies this assay can detect is 138 copies/mL. A negative result does not preclude SARS-Cov-2 infection and should not be used as the sole basis for treatment or other patient management decisions. A negative result may occur with  improper specimen collection/handling, submission of specimen other than nasopharyngeal swab, presence of viral mutation(s) within the areas targeted by this assay, and inadequate number of viral copies(<138 copies/mL). A negative result must be combined with clinical observations, patient history, and epidemiological information. The expected result is Negative.  Fact Sheet for Patients:  EntrepreneurPulse.com.au  Fact Sheet for Healthcare Providers:   IncredibleEmployment.be  This test is no t yet approved or cleared by the [place] FDA and  has been authorized for detection and/or diagnosis of SARS-CoV-2 by FDA under an Emergency Use Authorization (EUA). This EUA will remain  in effect (meaning this test can be used) for the duration of the COVID-19 declaration under Section 564(b)(1) of the Act, 21 U.S.C.section 360bbb-3(b)(1), unless the authorization is terminated  or revoked sooner.       Influenza A by PCR NEGATIVE NEGATIVE Final   Influenza B by PCR NEGATIVE NEGATIVE Final    Comment: (NOTE) The Xpert Xpress SARS-CoV-2/FLU/RSV plus assay is intended as an aid in the diagnosis of influenza from Nasopharyngeal swab specimens and should not be used as a sole basis for treatment. Nasal washings and aspirates are unacceptable for Xpert Xpress SARS-CoV-2/FLU/RSV testing.  Fact Sheet for Patients: EntrepreneurPulse.com.au  Fact Sheet for Healthcare Providers: IncredibleEmployment.be  This test is not yet approved or cleared by the [place] FDA and has been authorized for detection and/or diagnosis of  SARS-CoV-2 by FDA under an Emergency Use Authorization (EUA). This EUA will remain in effect (meaning this test can be used) for the duration of the COVID-19 declaration under Section 564(b)(1) of the Act, 21 U.S.C. section 360bbb-3(b)(1), unless the authorization is terminated or revoked.  Performed at [facility], [address] [place], [place] [address]      Labs: Basic Metabolic Panel: Recent Labs  Lab 02/03/21 1600 02/04/21 3329 02/05/21 0346 02/06/21 0235 02/06/21 1230 02/06/21 1237 02/06/21 1354 02/07/21 0309  NA 132* 133* 133* 133* 136  136 136  --  133*  K 3.1* 3.6 3.3* 4.0 4.1  4.1 4.1  --  4.3  CL 97* 101 101 103  --   --   --  103  CO2 25 25 24 23   --   --   --  24  GLUCOSE 154* 113* 104* 100*  --   --   --  110*  BUN  29* 25* 17 19  --   --   --  21*  CREATININE 2.06* 1.39* 1.31* 1.40*  --   --  1.42* 1.35*  CALCIUM 9.0 8.8* 9.0 9.0  --   --   --  8.8*  MG 2.9*  --   --  2.1  --   --   --   --   PHOS  --   --   --  3.2  --   --   --   --    Liver Function Tests: Recent Labs  Lab 02/03/21 1600  AST 19  ALT 13  ALKPHOS 51  BILITOT 0.7  PROT 7.7  ALBUMIN 4.0   Recent Labs  Lab 02/03/21 1600  LIPASE 32   No results for input(s): AMMONIA in the last 168 hours. CBC: Recent Labs  Lab 02/03/21 1600 02/04/21 0633 02/05/21 0346 02/06/21 0235 02/06/21 1230 02/06/21 1237 02/06/21 1354 02/07/21 0309  WBC 12.1* 12.6* 9.9 10.0  --   --  9.0 10.7*  NEUTROABS 8.2*  --   --  5.5  --   --   --   --   HGB 14.5 13.7 13.7 13.6 12.6*  12.6* 12.6* 12.9* 12.7*  HCT 41.4 38.6* 38.8* 39.6 37.0*  37.0* 37.0* 38.3* 36.9*  MCV 96.3 95.8 97.5 97.1  --   --  99.5 97.9  PLT 380 341 339 330  --   --  324 292   Cardiac Enzymes: No results for input(s): CKTOTAL, CKMB, CKMBINDEX, TROPONINI in the last 168 hours. BNP: BNP (last 3 results) Recent Labs    02/03/21 1600  BNP 107.4*    ProBNP (last 3 results) No results for input(s): PROBNP in the last 8760 hours.  CBG: No results for input(s): GLUCAP in the last 168 hours.     Signed:  Kayleen Memos, MD [facility] 02/07/2021, 2:17 PM

## 2021-03-05 NOTE — Progress Notes (Signed)
Cardiology Office Note:    Date:  03/06/2021   ID:  Darius Rivers, DOB November 23, 1960, MRN [id number]  PCP:  Darius Rivers, [facility] Cardiologist:  Darius Casino, MD >> Pt wants to switch to [facility]. Cardiology APP:  Darius Rivers     Referring MD: Darius Rivers, *   Chief Complaint:  Hospitalization Follow-up (CHF)    Patient Profile:    Darius Rivers is a 60 y.o. male with:   Coronary artery disease   NSTEMI in 11/2018  Myoview 11/2018: Lg inf scar, EF 34  Med Rx given chronic kidney disease   Cath 4/22: mLAD 15  (HFrEF) heart failure with reduced ejection fraction   Non-ischemic cardiomyopathy   Echocardiogram 11/2018: EF 40-45  Echocardiogram 4/22:  EF 25-30   NSVT   Chronic kidney disease   Ascending thoracic aortic aneurysm   Echocardiogram 4/22:  44 mm  Hypertension   Hyperlipidemia   Prostate CA    Prior CV studies: RIGHT/LEFT HEART CATH AND CORONARY ANGIOGRAPHY 02/06/2021 Narrative  Mid LAD lesion is 15% stenosed. Very low right heart pressures. RECOMMENDATION: Findings are consistent with a nonischemic cardiomyopathy.  Right heart pressures are extremely low.  Will gently hydrate post procedure in this patient with EF of 25 to 30%.  Echocardiogram 02/04/21 Diff HK, worse in inf base, EF 25-30, no RWMA, mild LVH, normal RVSF, mild LAE, trivial MR, mod AI, mild to mod AV sclerosis, no AS, mod dilation of ascending aorta (44 mm)  NM Myocar Multi W/Spect W/Wall Motion / EF 11/13/2018 Narrative IMPRESSION: 1. No evidence of pharmacologic induced myocardial ischemia. Large inferior wall myocardial infarct. 2. Inferior wall akinesis of left ventricle, with moderate to severe dilatation. 3. Left ventricular ejection fraction 34% 4. Non invasive risk stratification*: High  Echocardiogram 11/12/2018  EF 40-45, mild to mod AI  History of Present Illness: Mr. Darius Rivers was last seen by Dr. Debara Rivers  in 03/2019.  He was recently admitted 4/3-4/7 AKI.  He presented with n/v.  Creatinine was 2.06 but improved to 1.35 with hydration.  EF was noted to be worse at 25-30.  It was previously 40-45.  He underwent cardiac catheterization which demonstrated minimal non-obstructive CAD with 15% mLAD disease.  He was place on GDMT with Entresto, Carvedilol, Hydralazine, Isosorbide, Spironolactone.  He was noted to have NSVT and his beta-blocker was adjusted.  He returns for f/u.  He is here alone. He is doing well without chest pain, significant shortness of breath, syncope, palpitations, orthopnea, leg edema.          Past Medical History:  Diagnosis Date  . Chronic kidney disease stage II   . Coronary artery disease    NSTEMI in 11/2018 - Lg scar on Myoview; Rx medically due to CKD // Cath 4/22: mLAD 15 (no sig CAD)  . HFrEF (heart failure with reduced EF)    NICM // Echocardiogram 11/2018: EF 40-45 // Echocardiogram 4/22:  EF 25-30   . Hyperlipidemia   . Hypertension   . Nephrolithiasis   . NSVT (nonsustained ventricular tachycardia)   . Prostate cancer [facility])    watchful waiting, due to go back to urology  . Thoracic ascending aortic aneurysm    Echo 4/22: 44 mm    Current Medications: Current Meds  Medication Sig  . aspirin EC 81 MG tablet Take 81 mg by mouth daily.  . carvedilol (COREG) 25 MG tablet Take 1 tablet (25 mg total) by  mouth 2 (two) times daily with a meal.  . hydrALAZINE (APRESOLINE) 25 MG tablet Take 1 tablet (25 mg total) by mouth every 8 (eight) hours.  . isosorbide mononitrate (IMDUR) 60 MG 24 hr tablet Take 1 tablet (60 mg total) by mouth daily.  . rosuvastatin (CRESTOR) 40 MG tablet Take 1 tablet (40 mg total) by mouth daily at 6 PM.     Allergies:   Patient has no known allergies.   Social History   Tobacco Use  . Smoking status: Former Smoker    Packs/day: 0.33    Years: 20.00    Pack years: 6.60    Types: Cigarettes    Quit date: 11/11/1998    Years since  quitting: 22.3  . Smokeless tobacco: Never Used  Vaping Use  . Vaping Use: Never used  Substance Use Topics  . Alcohol use: Yes    Comment: 11/15/2018 "mixed drink q 2-3 weeks; if that"  . Drug use: Yes    Types: Marijuana    Comment: occasional use     Family Hx: The patient's family history includes High blood pressure in his mother. There is no history of Heart attack, Cancer, or Diabetes.  ROS   EKGs/Labs/Other Test Reviewed:    EKG:  EKG is not ordered today.  The ekg ordered today demonstrates n/a  Recent Labs: 02/03/2021: ALT 13; B Natriuretic Peptide 107.4 02/04/2021: TSH 0.637 02/06/2021: Magnesium 2.1 02/07/2021: BUN 21; Creatinine, Ser 1.35; Hemoglobin 12.7; Platelets 292; Potassium 4.3; Sodium 133   Recent Lipid Panel Lab Results  Component Value Date/Time   CHOL 117 12/07/2018 08:58 AM   TRIG 43 12/07/2018 08:58 AM   HDL 44 12/07/2018 08:58 AM   CHOLHDL 2.7 12/07/2018 08:58 AM   LDLCALC 64 12/07/2018 08:58 AM      Risk Assessment/Calculations:      Physical Exam:    VS:  BP (!) 160/80   Pulse (!) 58   Ht 6' 4.5" (1.943 m)   Wt 196 lb 9.6 oz (89.2 kg)   SpO2 99%   BMI 23.62 kg/m     Wt Readings from Last 3 Encounters:  03/06/21 196 lb 9.6 oz (89.2 kg)  02/07/21 181 lb 3.2 oz (82.2 kg)  03/10/19 185 lb (83.9 kg)     Constitutional:      Appearance: Healthy appearance. Not in distress.  Neck:     Vascular: JVD normal.  Pulmonary:     Breath sounds: No wheezing. No rales.  Cardiovascular:     Normal rate. Regular rhythm. Normal S1. Normal S2.     Murmurs: There is a grade 2/6 systolic murmur at the URSB and LLSB.     Comments: R wrist without hematoma Pulses:    Intact distal pulses.  Edema:    Peripheral edema absent.  Abdominal:     Palpations: Abdomen is soft.  Skin:    General: Skin is warm and dry.  Neurological:     General: No focal deficit present.     Mental Status: Alert and oriented to person, place and time.     Cranial  Nerves: Cranial nerves are intact.       ASSESSMENT & PLAN:    1. HFrEF (heart failure with reduced ejection fraction) (HCC) EF 25-30.  Non-ischemic cardiomyopathy.  NYHA II.  Volume status is stable.  He is not sure about his medications.  He will call us when he gets home to confirm.  As long as he has been taking [name]  49/51 mg twice daily, I will increase this to 97/103 mg twice daily.  Continue current dose of hydralazine, isosorbide, carvedilol, spironolactone.  Consider adding SGLT2i at some point.  For now, given his elevated BP, will focus on adjusting meds that will get his BP well controlled.  Will try to get his echocardiogram repeated in the next 90 days to recheck EF. Given NSVT, if EF remains < 35, will refer to EP for consideration of ICD.  F/u with PharmD in 3-4 weeks and me in 6-8 weeks.   2. Coronary artery disease involving native coronary artery of native heart without angina pectoris Minimal CAD on cardiac catheterization.  No angina. Continue ASA, statin.    3. Thoracic aortic aneurysm without rupture (HCC) 44 mm on echocardiogram . Will eventually need to get a CTA to size his aneurysm.  Continue with med adjustments to control his BP.  Continue beta-blocker.   4. Stage 3a chronic kidney disease (HCC) Creatinine improved prior to DC.  Check f/u BMET today.   5. Essential hypertension BP uncontrolled.  Goal < 130/80.  Adjust meds as noted.    6. Mixed hyperlipidemia LDL optimal on most recent lab work.  Continue current Rx.       Dispo:  Return in about 8 weeks (around 05/01/2021) for Routine Follow Up, w/ Richardson Dopp, PA-C, in person.   Medication Adjustments/Labs and Tests Ordered: Current medicines are reviewed at length with the patient today.  Concerns regarding medicines are outlined above.  Tests Ordered: Orders Placed This Encounter  Procedures  . Basic Metabolic Panel (BMET)  . Ambulatory referral to [facility] [place]   Medication Changes: No orders of the defined types were placed in this encounter.   Signed, Richardson Dopp, PA-C  03/06/2021 12:18 PM    [facility] [address], [place], [place]  [address] Phone: [phone]; Fax: [phone]

## 2021-03-06 ENCOUNTER — Ambulatory Visit (INDEPENDENT_AMBULATORY_CARE_PROVIDER_SITE_OTHER): Payer: BC Managed Care – PPO | Admitting: Physician Assistant

## 2021-03-06 ENCOUNTER — Telehealth: Payer: Self-pay | Admitting: Physician Assistant

## 2021-03-06 ENCOUNTER — Other Ambulatory Visit: Payer: Self-pay

## 2021-03-06 ENCOUNTER — Encounter: Payer: Self-pay | Admitting: Physician Assistant

## 2021-03-06 VITALS — BP 160/80 | HR 58 | Ht 76.5 in | Wt 196.6 lb

## 2021-03-06 DIAGNOSIS — N1831 Chronic kidney disease, stage 3a: Secondary | ICD-10-CM

## 2021-03-06 DIAGNOSIS — I712 Thoracic aortic aneurysm, without rupture, unspecified: Secondary | ICD-10-CM

## 2021-03-06 DIAGNOSIS — E782 Mixed hyperlipidemia: Secondary | ICD-10-CM

## 2021-03-06 DIAGNOSIS — I2510 Atherosclerotic heart disease of native coronary artery without angina pectoris: Secondary | ICD-10-CM | POA: Diagnosis not present

## 2021-03-06 DIAGNOSIS — I5020 Unspecified systolic (congestive) heart failure: Secondary | ICD-10-CM

## 2021-03-06 DIAGNOSIS — I10 Essential (primary) hypertension: Secondary | ICD-10-CM

## 2021-03-06 LAB — BASIC METABOLIC PANEL WITH GFR
BUN/Creatinine Ratio: 11 (ref 9–20)
BUN: 13 mg/dL (ref 6–24)
CO2: 21 mmol/L (ref 20–29)
Calcium: 8.9 mg/dL (ref 8.7–10.2)
Chloride: 106 mmol/L (ref 96–106)
Creatinine, Ser: 1.21 mg/dL (ref 0.76–1.27)
Glucose: 87 mg/dL (ref 65–99)
Potassium: 4.7 mmol/L (ref 3.5–5.2)
Sodium: 140 mmol/L (ref 134–144)
eGFR: 70 mL/min/1.73

## 2021-03-06 NOTE — Telephone Encounter (Signed)
**Note De-Identified Darius Rivers Obfuscation** The pt confirmed that he has commercial BCBS ins RX coverage so he can use a $10 Entresto co-pay card. He is aware that we are leaving him a $10 Entresto co-pay card in the front office at [facility] office to pickup. He is aware that the card will need to be activated and I advised him to take it with him when he goes to pick up his Entresto refill just in case they need the information from the card.  He thanked me for calling him back and states that he will pick the card up in the morning as he is going to work shortly.

## 2021-03-06 NOTE — Patient Instructions (Signed)
Medication Instructions:  Your physician recommends that you continue on your current medications as directed. Please refer to the Current Medication list given to you today.    *If you need a refill on your cardiac medications before your next appointment, please call your pharmacy*   Lab Work: TODAY!!!  BMET   If you have labs (blood work) drawn today and your tests are completely normal, you will receive your results only by: Marland Kitchen MyChart Message (if you have MyChart) OR . A paper copy in the mail If you have any lab test that is abnormal or we need to change your treatment, we will call you to review the results.   Testing/Procedures: You have been referred to HTN clinic on Wednesday, May 25 @ 8:30 am.    Follow-Up: At [facility], you and your health needs are our priority.  As part of our continuing mission to provide you with exceptional heart care, we have created designated Provider Care Teams.  These Care Teams include your primary Cardiologist (physician) and Advanced Practice Providers (APPs -  Physician Assistants and Nurse Practitioners) who all work together to provide you with the care you need, when you need it.  We recommend signing up for the patient portal called "MyChart".  Sign up information is provided on this After Visit Summary.  MyChart is used to connect with patients for Virtual Visits (Telemedicine).  Patients are able to view lab/test results, encounter notes, upcoming appointments, etc.  Non-urgent messages can be sent to your provider as well.   To learn more about what you can do with MyChart, go to [email or web address].    Your next appointment:   2 month(s) with Richardson Dopp, PA on Tuesday, June 28 @ 8:45 am.    The format for your next appointment:   In Person  Provider:   Richardson Dopp, PA-C   Other Instructions  1.  Patient is going to call office to let us know about medications.

## 2021-03-06 NOTE — Telephone Encounter (Signed)
    Pt c/o medication issue:  1. Name of Medication:   sacubitril-valsartan (ENTRESTO) 49-51 MG    2. How are you currently taking this medication (dosage and times per day)? Take 1 tablet by mouth 2 (two) times daily.  3. Are you having a reaction (difficulty breathing--STAT)?  4. What is your medication issue? Pt is calling back, he said he never got the Entresto because its going to cost him $175, he wanted to know if he can get any type of discount for this medication

## 2021-03-07 ENCOUNTER — Telehealth: Payer: Self-pay | Admitting: *Deleted

## 2021-03-07 DIAGNOSIS — Z79899 Other long term (current) drug therapy: Secondary | ICD-10-CM

## 2021-03-07 NOTE — Telephone Encounter (Signed)
-----   Message from Nuala Alpha, LPN sent at 0/11/7791  5:10 PM EDT -----  ----- Message ----- From: Liliane Shi, PA-C Sent: 03/06/2021   5:07 PM EDT To: Rebeca Alert [facility] Triage  Creatinine, potassium normal Phone note from earlier today reviewed. The patient had never started [facility] but is picking up prescription card and will start it soon. We will need to repeat his BMET again after starting Entresto. PLAN:  - Continue current medications/treatment plan and follow up as scheduled.  - BMET in 2 weeks Richardson Dopp, PA-C    03/06/2021 5:05 PM

## 2021-03-22 ENCOUNTER — Other Ambulatory Visit: Payer: BC Managed Care – PPO | Admitting: *Deleted

## 2021-03-22 ENCOUNTER — Other Ambulatory Visit: Payer: Self-pay

## 2021-03-22 DIAGNOSIS — Z79899 Other long term (current) drug therapy: Secondary | ICD-10-CM | POA: Diagnosis not present

## 2021-03-22 LAB — BASIC METABOLIC PANEL
BUN/Creatinine Ratio: 11 (ref 9–20)
BUN: 13 mg/dL (ref 6–24)
CO2: 21 mmol/L (ref 20–29)
Calcium: 9.2 mg/dL (ref 8.7–10.2)
Chloride: 110 mmol/L — ABNORMAL HIGH (ref 96–106)
Creatinine, Ser: 1.19 mg/dL (ref 0.76–1.27)
Glucose: 94 mg/dL (ref 65–99)
Potassium: 4.3 mmol/L (ref 3.5–5.2)
Sodium: 141 mmol/L (ref 134–144)
eGFR: 71 mL/min/{1.73_m2} (ref >59)

## 2021-03-26 NOTE — Patient Instructions (Addendum)
It was nice to see you today!  Your blood pressure goal is < 130/32mmHg  We are checking your electrolytes, kidney function, and cholesterol today  If your labs are stable, we will plan to increase your Entresto to 97-103mg  twice daily.  I will reach out to your insurance to see if they will cover Jardiance as well  Limit your daily sodium intake to < 2,000mg  daily

## 2021-03-27 ENCOUNTER — Telehealth: Payer: Self-pay | Admitting: Pharmacist

## 2021-03-27 ENCOUNTER — Ambulatory Visit (INDEPENDENT_AMBULATORY_CARE_PROVIDER_SITE_OTHER): Payer: BC Managed Care – PPO | Admitting: Pharmacist

## 2021-03-27 ENCOUNTER — Other Ambulatory Visit: Payer: Self-pay

## 2021-03-27 VITALS — BP 146/90 | HR 56 | Wt 192.0 lb

## 2021-03-27 DIAGNOSIS — I2510 Atherosclerotic heart disease of native coronary artery without angina pectoris: Secondary | ICD-10-CM | POA: Diagnosis not present

## 2021-03-27 DIAGNOSIS — E782 Mixed hyperlipidemia: Secondary | ICD-10-CM

## 2021-03-27 DIAGNOSIS — I5042 Chronic combined systolic (congestive) and diastolic (congestive) heart failure: Secondary | ICD-10-CM | POA: Diagnosis not present

## 2021-03-27 DIAGNOSIS — I5020 Unspecified systolic (congestive) heart failure: Secondary | ICD-10-CM | POA: Diagnosis not present

## 2021-03-27 LAB — COMPREHENSIVE METABOLIC PANEL
ALT: 10 IU/L (ref 0–44)
AST: 13 IU/L (ref 0–40)
Albumin/Globulin Ratio: 1.7 (ref 1.2–2.2)
Albumin: 4 g/dL (ref 3.8–4.9)
Alkaline Phosphatase: 56 IU/L (ref 44–121)
BUN/Creatinine Ratio: 13 (ref 9–20)
BUN: 15 mg/dL (ref 6–24)
Bilirubin Total: 0.3 mg/dL (ref 0.0–1.2)
CO2: 21 mmol/L (ref 20–29)
Calcium: 9.2 mg/dL (ref 8.7–10.2)
Chloride: 106 mmol/L (ref 96–106)
Creatinine, Ser: 1.15 mg/dL (ref 0.76–1.27)
Globulin, Total: 2.4 g/dL (ref 1.5–4.5)
Glucose: 93 mg/dL (ref 65–99)
Potassium: 3.9 mmol/L (ref 3.5–5.2)
Sodium: 139 mmol/L (ref 134–144)
Total Protein: 6.4 g/dL (ref 6.0–8.5)
eGFR: 74 mL/min/{1.73_m2} (ref >59)

## 2021-03-27 LAB — LIPID PANEL
Chol/HDL Ratio: 3.1 ratio (ref 0.0–5.0)
Cholesterol, Total: 123 mg/dL (ref 100–199)
HDL: 40 mg/dL (ref >39)
LDL Chol Calc (NIH): 71 mg/dL (ref 0–99)
Triglycerides: 55 mg/dL (ref 0–149)
VLDL Cholesterol Cal: 12 mg/dL (ref 5–40)

## 2021-03-27 MED ORDER — EMPAGLIFLOZIN 10 MG PO TABS: 10.0000 mg | ORAL_TABLET | Freq: Every day | ORAL | 3 refills | Status: DC

## 2021-03-27 NOTE — Progress Notes (Signed)
Patient ID: [name]                 DOB: [birth year]                      MRN: [id number]     HPI: Darius Rivers is a 60 y.o. male referred by Darius Dopp, PA to pharmacy clinic for HF medication management. PMH is significant for CAD s/p NSTEMI 11/2018, HFrEF with LVEF 40-45% on 11/2018 echo, most recently decreased to 25-30% on 02/2021 echo, CKD, HTN, HLD, and prostate cancer. He was recently seen by Darius Rivers on 03/06/21 and BP was elevated at 160/80. He had not been taking Entresto as advised, and was started on 49-51mg  BID dosing.   Pt presents today in good spirits. The pharmacy had to order his [name] so he was not able to start on therapy until 5 days ago (same day he had BMET checked). Reports tolerating meds well. Denies dizziness, balance issues, swelling, blurred vision, headache, and SOB. 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  Current CHF meds:  Carvedilol 25mg  BID Entresto 49-51mg  BID - 7am, 7pm Spironolactone 25mg  daily Hydralazine 25mg  TID - 7am, 7pm, 12am Imdur 60mg  daily  BP goal: <130/87mmHg  Family History: High blood pressure in his mother. There is no history of Heart attack, Cancer, or Diabetes.  Social History: Former smoker for 20 years, quit in 2000. Occasional alcohol and marijuana use.   Diet: Has tried to cut back on fast food, rinses food to lower sodium content. Likes [place], limiting bacon. Decaf coffee.  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  Past Medical History:  Diagnosis Date  . Chronic kidney disease stage II   . Coronary artery disease    NSTEMI in 11/2018 - Lg scar on Myoview; Rx medically due to CKD // Cath 4/22: mLAD 15 (no sig CAD)  . HFrEF (heart failure with reduced EF)    NICM // Echocardiogram 11/2018: EF 40-45 // Echocardiogram 4/22:  EF 25-30   . Hyperlipidemia   . Hypertension   . Nephrolithiasis   . NSVT (nonsustained ventricular tachycardia)   . Prostate cancer [facility])    watchful waiting, due to go back to urology  . Thoracic ascending aortic aneurysm    Echo 4/22: 44 mm    Current Outpatient Medications on File Prior to Visit  Medication Sig Dispense Refill  . aspirin EC 81 MG tablet Take 81 mg by mouth daily.    . carvedilol (COREG) 25 MG tablet Take 1 tablet (25 mg total) by mouth 2 (two) times daily with a meal. 180 tablet 0  . hydrALAZINE (APRESOLINE) 25 MG tablet Take 1 tablet (25 mg total) by mouth every 8 (eight) hours. 270 tablet 0  . isosorbide mononitrate (IMDUR) 60 MG 24 hr tablet Take 1 tablet (60 mg total) by mouth daily. 90 tablet 0  . rosuvastatin (  CRESTOR) 40 MG tablet Take 1 tablet (40 mg total) by mouth daily at 6 PM. 90 tablet 0  . sacubitril-valsartan (ENTRESTO) 49-51 MG Take 1 tablet by mouth 2 (two) times daily. 180 tablet 0  . spironolactone (ALDACTONE) 25 MG tablet Take 1 tablet (25 mg total) by mouth daily. 90 tablet 0   No current facility-administered medications on file prior to visit.    No Known Allergies   Assessment/Plan:  1. CHF - BP improved from last visit but remains above goal <130/69mmHg and have room to optimize CHF medications. Will start Jardiance 10mg  daily. Med available on formulary and pt provided with $10 copay card. Checking BMET today since pt was delayed in starting Entresto due to pharmacy needing to order med - he has been on Entresto 49-51mg  BID for the past 5 days. If labs are stable, will plan to increase to 97-103mg  BID. Pharmacy  did give him a 90 day supply so he would double up on his current rx for a while. Will continue carvedilol 25mg  BID, spironolactone 25mg  daily, hydralazine 25mg  TID, and Imdur 60mg  daily. Pt aware he will be called tomorrow with Entresto dose update pending lab results.  2. Hyperlipidemia - LDL goal < 55mg /dL per AACE/ACE guidelines given history of premature ASCVD. Pt currently taking rosuvastatin 40mg  daily, last LDL was 64 in 2020. Checking updated lipid panel today.  Darius Rivers E. Darius Rivers, PharmD, BCACP, [place] [address] [place], [place] [address] Phone: [phone]; Fax: [phone] 03/27/2021 9:11 AM

## 2021-03-27 NOTE — Telephone Encounter (Signed)
Received a fax that Darius Rivers is available to the patient without a prior [place]

## 2021-03-27 NOTE — Telephone Encounter (Signed)
I saw pt today for CHF med follow up. Pt is aware to start Jardiance 10mg  daily and was provided with copay card at office visit.

## 2021-03-28 ENCOUNTER — Telehealth: Payer: Self-pay | Admitting: Pharmacist

## 2021-03-28 DIAGNOSIS — I5042 Chronic combined systolic (congestive) and diastolic (congestive) heart failure: Secondary | ICD-10-CM

## 2021-03-28 DIAGNOSIS — E782 Mixed hyperlipidemia: Secondary | ICD-10-CM

## 2021-03-28 MED ORDER — ENTRESTO 97-103 MG PO TABS: 1.0000 | ORAL_TABLET | Freq: Two times a day (BID) | ORAL | 3 refills | Status: DC

## 2021-03-28 MED ORDER — EZETIMIBE 10 MG PO TABS: 10.0000 mg | ORAL_TABLET | Freq: Every day | ORAL | 3 refills | Status: DC

## 2021-03-28 NOTE — Telephone Encounter (Signed)
Spoke to patient. Reviewed labs with him. Advised that we will increase his Entresto to 97/103mg  twice a day. He may take 2 tablets of the 49/51mg  twice a day until he runs out. New rx for the 97/103mg  sent to pharmacy. Our next available apt is not until 6/17. He already has an apt with Scott weaver on 6/28. Therefore I will have him come in for lab work only on 6/8 and then will follow up with scott on 6/28. Per Nicki Reaper- add zetia. I advised the patient of this and sent Rx to pharmacy. Patient verbalized understanding.

## 2021-03-28 NOTE — Telephone Encounter (Signed)
Scr and K stable. Normal BMP. Will plan to increase Entresto to 97/103mg  twice a day. Pt just got a 90 day supply of the 49-51mg  dosing so he can double up until he runs out.  New Rx send? Needs apt for 2 weeks  Called pt and LVM to call back

## 2021-04-11 ENCOUNTER — Other Ambulatory Visit: Payer: BC Managed Care – PPO | Admitting: *Deleted

## 2021-04-11 ENCOUNTER — Other Ambulatory Visit: Payer: Self-pay

## 2021-04-11 DIAGNOSIS — I5042 Chronic combined systolic (congestive) and diastolic (congestive) heart failure: Secondary | ICD-10-CM

## 2021-04-11 LAB — BASIC METABOLIC PANEL
BUN/Creatinine Ratio: 13 (ref 9–20)
BUN: 15 mg/dL (ref 6–24)
CO2: 20 mmol/L (ref 20–29)
Calcium: 9.3 mg/dL (ref 8.7–10.2)
Chloride: 107 mmol/L — ABNORMAL HIGH (ref 96–106)
Creatinine, Ser: 1.16 mg/dL (ref 0.76–1.27)
Glucose: 95 mg/dL (ref 65–99)
Potassium: 4 mmol/L (ref 3.5–5.2)
Sodium: 141 mmol/L (ref 134–144)
eGFR: 73 mL/min/{1.73_m2} (ref >59)

## 2021-04-12 ENCOUNTER — Telehealth: Payer: Self-pay | Admitting: Pharmacist

## 2021-04-12 NOTE — Telephone Encounter (Signed)
Labs stable after increasing Entresto. Continue Entresto 97/103mg  BID, Spironolactone 25mg  daily, Jardiance 10mg  daily, carvedilol 25mg  BID,isosorbide 60mg  daily and hydralazine 25mg  TID.  Called pt and LVM for patient to call back

## 2021-04-29 NOTE — Progress Notes (Signed)
Cardiology Office Note:    Date:  04/30/2021   ID:  Darius Rivers, DOB [birth year], MRN [id number]  PCP:  Fortino Sic, [place] Providers Cardiologist:  Freada Bergeron, MD Cardiology APP:  Sharmon Revere       Referring MD: Fortino Sic, *   Chief Complaint:  Follow-up (CHF)    Patient Profile:    Darius Rivers is a 60 y.o. male with:  Coronary artery disease NSTEMI in 11/2018 Myoview 11/2018: Lg inf scar, EF 34 Med Rx given chronic kidney disease Cath 4/22: mLAD 15 (HFrEF) heart failure with reduced ejection fraction  Non-ischemic cardiomyopathy Echocardiogram 11/2018: EF 40-45 Echocardiogram 4/22:  EF 25-30 NSVT Chronic kidney disease Ascending thoracic aortic aneurysm Echocardiogram 4/22:  44 mm Hypertension Hyperlipidemia Prostate CA   Prior CV studies: RIGHT/LEFT HEART CATH AND CORONARY ANGIOGRAPHY 02/06/2021 Narrative  Mid LAD lesion is 15% stenosed. Very low right heart pressures. RECOMMENDATION: Findings are consistent with a nonischemic cardiomyopathy.  Right heart pressures are extremely low.  Will gently hydrate post procedure in this patient with EF of 25 to 30%.   Echocardiogram 02/04/21 Diff HK, worse in inf base, EF 25-30, no RWMA, mild LVH, normal RVSF, mild LAE, trivial MR, mod AI, mild to mod AV sclerosis, no AS, mod dilation of ascending aorta (44 mm)   NM Myocar Multi W/Spect W/Wall Motion / EF 11/13/2018 Narrative IMPRESSION: 1. No evidence of pharmacologic induced myocardial ischemia. Large inferior wall myocardial infarct. 2. Inferior wall akinesis of left ventricle, with moderate to severe dilatation. 3. Left ventricular ejection fraction 34% 4. Non invasive risk stratification*: High   Echocardiogram 11/12/2018 EF 40-45, mild to mod AI  History of Present Illness: Mr. Darius Rivers was last seen in 5/22 for post hospital f/u.  He had been admitted with AKI and n/v and was noted to have  worsening LVF with EF 25-30. He returns for further titration of GDMT for his HFrEF.  He is here alone.  Overall, he has been doing well.  He has not had chest pain, shortness of breath, syncope, orthopnea, leg edema        Past Medical History:  Diagnosis Date   Chronic kidney disease stage II    Coronary artery disease    NSTEMI in 11/2018 - Lg scar on Myoview; Rx medically due to CKD // Cath 4/22: mLAD 15 (no sig CAD)   HFrEF (heart failure with reduced EF)    NICM // Echocardiogram 11/2018: EF 40-45 // Echocardiogram 4/22:  EF 25-30    Hyperlipidemia    Hypertension    Nephrolithiasis    NSVT (nonsustained ventricular tachycardia)    Prostate cancer ([place])    watchful waiting, due to go back to urology   Thoracic ascending aortic aneurysm    Echo 4/22: 44 mm    Current Medications: Current Meds  Medication Sig   aspirin EC 81 MG tablet Take 81 mg by mouth daily.   carvedilol (COREG) 25 MG tablet Take 1 tablet (25 mg total) by mouth 2 (two) times daily with a meal.   empagliflozin (JARDIANCE) 10 MG TABS tablet Take 1 tablet (10 mg total) by mouth daily before breakfast.   ezetimibe (ZETIA) 10 MG tablet Take 1 tablet (10 mg total) by mouth daily.   hydrALAZINE (APRESOLINE) 50 MG tablet Take 1 tablet (50 mg total) by mouth 3 (three) times daily.   isosorbide mononitrate (IMDUR) 60 MG 24 hr tablet Take 1 tablet (60 mg  total) by mouth daily.   rosuvastatin (CRESTOR) 40 MG tablet Take 1 tablet (40 mg total) by mouth daily at 6 PM.   sacubitril-valsartan (ENTRESTO) 97-103 MG Take 1 tablet by mouth 2 (two) times daily.   spironolactone (ALDACTONE) 25 MG tablet Take 1 tablet (25 mg total) by mouth daily.   [DISCONTINUED] hydrALAZINE (APRESOLINE) 25 MG tablet Take 1 tablet (25 mg total) by mouth every 8 (eight) hours.     Allergies:   Patient has no known allergies.   Social History   Tobacco Use   Smoking status: Former    Packs/day: 0.33    Years: 20.00    Pack years: 6.60     Types: Cigarettes    Quit date: 11/11/1998    Years since quitting: 22.4   Smokeless tobacco: Never  Vaping Use   Vaping Use: Never used  Substance Use Topics   Alcohol use: Yes    Comment: 11/15/2018 "mixed drink q 2-3 weeks; if that"   Drug use: Yes    Types: Marijuana    Comment: occasional use     Family Hx: The patient's family history includes High blood pressure in his mother. There is no history of Heart attack, Cancer, or Diabetes.  ROS   EKGs/Labs/Other Test Reviewed:    EKG:  EKG is not ordered today.  The ekg ordered today demonstrates n/a  Recent Labs: 02/03/2021: B Natriuretic Peptide 107.4 02/04/2021: TSH 0.637 02/06/2021: Magnesium 2.1 02/07/2021: Hemoglobin 12.7; Platelets 292 03/27/2021: ALT 10 04/11/2021: BUN 15; Creatinine, Ser 1.16; Potassium 4.0; Sodium 141   Recent Lipid Panel Lab Results  Component Value Date/Time   CHOL 123 03/27/2021 09:05 AM   TRIG 55 03/27/2021 09:05 AM   HDL 40 03/27/2021 09:05 AM   LDLCALC 71 03/27/2021 09:05 AM      Risk Assessment/Calculations:      Physical Exam:    VS:  BP (!) 130/92   Pulse 68   Ht 6\' 4"  (1.93 m)   Wt 181 lb 3.2 oz (82.2 kg)   SpO2 96%   BMI 22.06 kg/m     Wt Readings from Last 3 Encounters:  04/30/21 181 lb 3.2 oz (82.2 kg)  03/27/21 192 lb (87.1 kg)  03/06/21 196 lb 9.6 oz (89.2 kg)     Constitutional:      Appearance: Healthy appearance. Not in distress.  Neck:     Vascular: JVD normal.  Pulmonary:     Effort: Pulmonary effort is normal.     Breath sounds: No wheezing. No rales.  Cardiovascular:     Normal rate. Regular rhythm. Normal S1. Normal S2.      Murmurs: There is a grade 2/6 systolic murmur at the URSB.  Edema:    Peripheral edema absent.  Abdominal:     Palpations: Abdomen is soft.  Skin:    General: Skin is warm and dry.  Neurological:     General: No focal deficit present.     Mental Status: Alert and oriented to person, place and time.     Cranial Nerves: Cranial  nerves are intact.        ASSESSMENT & PLAN:    1. HFrEF (heart failure with reduced ejection fraction) (HCC) EF 25-30.  Non-ischemic cardiomyopathy.  NYHA II.  Volume status is stable.  His blood pressure remains borderline elevated.  I will adjust his hydralazine further to 50 mg 3 times a day.  Continue current dose of Entresto, carvedilol, spironolactone, isosorbide, empagliflozin.  Arrange follow-up  limited echocardiogram in 4-6 weeks to recheck EF.  If EF remains <35, refer to EP for consideration of ICD.  Obtain follow-up BMET today.  2. Stage 3a chronic kidney disease (HCC) Creatinine has remained stable.  Recheck BMET today.  3. Essential hypertension Blood pressure above target.  Increase hydralazine as noted.  4. Coronary artery disease involving native coronary artery of native heart without angina pectoris Minimal CAD on recent cardiac catheterization.  He is not having anginal symptoms.  Continue aspirin, rosuvastatin, ezetimibe.    5. Mixed hyperlipidemia Goal LDL <70.  Ezetimibe recently added to achieve LDL goal.  Labs are pending.   6.  Thoracic aortic aneurysm 44 mm on echocardiogram.  Arrange chest CTA.   Dispo:  Return in about 3 months (around 07/31/2021) for Routine Follow Up, w/ Richardson Dopp, PA-C.   Medication Adjustments/Labs and Tests Ordered: Current medicines are reviewed at length with the patient today.  Concerns regarding medicines are outlined above.  Tests Ordered: Orders Placed This Encounter  Procedures   CT ANGIO CHEST AORTA W/CM & OR WO/CM   Basic metabolic panel   ECHOCARDIOGRAM LIMITED    Medication Changes: Meds ordered this encounter  Medications   hydrALAZINE (APRESOLINE) 50 MG tablet    Sig: Take 1 tablet (50 mg total) by mouth 3 (three) times daily.    Dispense:  270 tablet    Refill:  3     Signed, Richardson Dopp, PA-C  04/30/2021 10:02 AM    [facility], [place], [place]  [address] Phone:  [phone]; Fax: [phone]

## 2021-04-30 ENCOUNTER — Ambulatory Visit (INDEPENDENT_AMBULATORY_CARE_PROVIDER_SITE_OTHER): Payer: BC Managed Care – PPO | Admitting: Physician Assistant

## 2021-04-30 ENCOUNTER — Other Ambulatory Visit: Payer: Self-pay

## 2021-04-30 ENCOUNTER — Encounter: Payer: Self-pay | Admitting: Physician Assistant

## 2021-04-30 VITALS — BP 130/92 | HR 68 | Ht 76.0 in | Wt 181.2 lb

## 2021-04-30 DIAGNOSIS — N1831 Chronic kidney disease, stage 3a: Secondary | ICD-10-CM

## 2021-04-30 DIAGNOSIS — I2510 Atherosclerotic heart disease of native coronary artery without angina pectoris: Secondary | ICD-10-CM | POA: Diagnosis not present

## 2021-04-30 DIAGNOSIS — I5020 Unspecified systolic (congestive) heart failure: Secondary | ICD-10-CM | POA: Diagnosis not present

## 2021-04-30 DIAGNOSIS — I10 Essential (primary) hypertension: Secondary | ICD-10-CM

## 2021-04-30 DIAGNOSIS — I712 Thoracic aortic aneurysm, without rupture, unspecified: Secondary | ICD-10-CM

## 2021-04-30 DIAGNOSIS — E782 Mixed hyperlipidemia: Secondary | ICD-10-CM

## 2021-04-30 LAB — BASIC METABOLIC PANEL
BUN/Creatinine Ratio: 10 (ref 9–20)
BUN: 15 mg/dL (ref 6–24)
CO2: 20 mmol/L (ref 20–29)
Calcium: 9.8 mg/dL (ref 8.7–10.2)
Chloride: 109 mmol/L — ABNORMAL HIGH (ref 96–106)
Creatinine, Ser: 1.44 mg/dL — ABNORMAL HIGH (ref 0.76–1.27)
Glucose: 103 mg/dL — ABNORMAL HIGH (ref 65–99)
Potassium: 3.8 mmol/L (ref 3.5–5.2)
Sodium: 142 mmol/L (ref 134–144)
eGFR: 56 mL/min/{1.73_m2} — ABNORMAL LOW (ref >59)

## 2021-04-30 MED ORDER — HYDRALAZINE HCL 50 MG PO TABS: 50.0000 mg | ORAL_TABLET | Freq: Three times a day (TID) | ORAL | 3 refills | Status: DC

## 2021-04-30 NOTE — Patient Instructions (Addendum)
Medication Instructions:  Increase Hydralazine to 50 mg 3 times a day   *If you need a refill on your cardiac medications before your next appointment, please call your pharmacy*   Lab Work: Bmp- Today   If you have labs (blood work) drawn today and your tests are completely normal, you will receive your results only by: [place] (if you have MyChart) OR A paper copy in the mail If you have any lab test that is abnormal or we need to change your treatment, we will call you to review the results.   Testing/Procedures: Your physician has requested that you have an echocardiogram in 4-6 weeks. Echocardiography is a painless test that uses sound waves to create images of your heart. It provides your doctor with information about the size and shape of your heart and how well your heart's chambers and valves are working. This procedure takes approximately one hour. There are no restrictions for this procedure.  Your physician has ordered for you to have a chest cta of the aorta   Follow-Up: At [facility], you and your health needs are our priority.  As part of our continuing mission to provide you with exceptional heart care, we have created designated Provider Care Teams.  These Care Teams include your primary Cardiologist (physician) and Advanced Practice Providers (APPs -  Physician Assistants and Nurse Practitioners) who all work together to provide you with the care you need, when you need it.  We recommend signing up for the patient portal called "MyChart".  Sign up information is provided on this After Visit Summary.  MyChart is used to connect with patients for Virtual Visits (Telemedicine).  Patients are able to view lab/test results, encounter notes, upcoming appointments, etc.  Non-urgent messages can be sent to your provider as well.   To learn more about what you can do with MyChart, go to [email or web address].    Your next appointment:   3 month(s)  The format for  your next appointment:   In Person  Provider:   Richardson Dopp, PA-C   Other Instructions None

## 2021-05-10 DIAGNOSIS — I10 Essential (primary) hypertension: Secondary | ICD-10-CM | POA: Diagnosis not present

## 2021-05-10 DIAGNOSIS — I110 Hypertensive heart disease with heart failure: Secondary | ICD-10-CM | POA: Diagnosis not present

## 2021-05-10 DIAGNOSIS — E782 Mixed hyperlipidemia: Secondary | ICD-10-CM | POA: Diagnosis not present

## 2021-05-10 DIAGNOSIS — I5042 Chronic combined systolic (congestive) and diastolic (congestive) heart failure: Secondary | ICD-10-CM | POA: Diagnosis not present

## 2021-05-10 DIAGNOSIS — R7309 Other abnormal glucose: Secondary | ICD-10-CM | POA: Diagnosis not present

## 2021-05-15 ENCOUNTER — Other Ambulatory Visit: Payer: Self-pay

## 2021-05-15 MED ORDER — CARVEDILOL 25 MG PO TABS: 25.0000 mg | ORAL_TABLET | Freq: Two times a day (BID) | ORAL | 3 refills | Status: DC

## 2021-05-15 MED ORDER — ISOSORBIDE MONONITRATE ER 60 MG PO TB24: 60.0000 mg | ORAL_TABLET | Freq: Every day | ORAL | 3 refills | Status: DC

## 2021-05-15 MED ORDER — ROSUVASTATIN CALCIUM 40 MG PO TABS: 40.0000 mg | ORAL_TABLET | Freq: Every day | ORAL | 3 refills | Status: DC

## 2021-05-15 MED ORDER — SPIRONOLACTONE 25 MG PO TABS: 25.0000 mg | ORAL_TABLET | Freq: Every day | ORAL | 3 refills | Status: DC

## 2021-05-15 NOTE — Telephone Encounter (Signed)
Pt's medications were sent to pt's pharmacy as requested. Confirmation received.  

## 2021-05-20 ENCOUNTER — Encounter: Payer: Self-pay | Admitting: *Deleted

## 2021-05-22 ENCOUNTER — Telehealth: Payer: Self-pay | Admitting: Physician Assistant

## 2021-05-22 ENCOUNTER — Other Ambulatory Visit: Payer: Self-pay

## 2021-05-22 MED ORDER — ENTRESTO 97-103 MG PO TABS: 1.0000 | ORAL_TABLET | Freq: Two times a day (BID) | ORAL | 3 refills | Status: DC

## 2021-05-22 NOTE — Telephone Encounter (Signed)
*  STAT* If patient is at the pharmacy, call can be transferred to refill team.   1. Which medications need to be refilled? (please list name of each medication and dose if known) sacubitril-valsartan (ENTRESTO) 97-103 MG  2. Which pharmacy/location (including street and city if local pharmacy) is medication to be sent to? [facility] [id number] - [place]  3. Do they need a 30 day or 90 day supply? 90 day supply    Pt is all out of this medication

## 2021-05-24 ENCOUNTER — Encounter (INDEPENDENT_AMBULATORY_CARE_PROVIDER_SITE_OTHER): Payer: Self-pay

## 2021-05-24 ENCOUNTER — Other Ambulatory Visit: Payer: Self-pay

## 2021-05-24 ENCOUNTER — Ambulatory Visit (INDEPENDENT_AMBULATORY_CARE_PROVIDER_SITE_OTHER)
Admission: RE | Admit: 2021-05-24 | Discharge: 2021-05-24 | Disposition: A | Discharge status: Home / Self Care | Payer: BC Managed Care – PPO | Source: Ambulatory Visit | Attending: Physician Assistant | Admitting: Physician Assistant

## 2021-05-24 ENCOUNTER — Ambulatory Visit (HOSPITAL_COMMUNITY): Payer: BC Managed Care – PPO | Attending: Cardiovascular Disease

## 2021-05-24 ENCOUNTER — Encounter: Payer: Self-pay | Admitting: Physician Assistant

## 2021-05-24 DIAGNOSIS — I5020 Unspecified systolic (congestive) heart failure: Secondary | ICD-10-CM | POA: Insufficient documentation

## 2021-05-24 DIAGNOSIS — I712 Thoracic aortic aneurysm, without rupture, unspecified: Secondary | ICD-10-CM

## 2021-05-24 DIAGNOSIS — I700 Atherosclerosis of aorta: Secondary | ICD-10-CM | POA: Diagnosis not present

## 2021-05-24 DIAGNOSIS — R911 Solitary pulmonary nodule: Secondary | ICD-10-CM | POA: Diagnosis not present

## 2021-05-24 DIAGNOSIS — I7121 Aneurysm of the ascending aorta, without rupture: Secondary | ICD-10-CM | POA: Insufficient documentation

## 2021-05-24 DIAGNOSIS — J9811 Atelectasis: Secondary | ICD-10-CM | POA: Diagnosis not present

## 2021-05-24 LAB — ECHOCARDIOGRAM LIMITED
Area-P 1/2: 2.95 cm2
P 1/2 time: 894 msec
S' Lateral: 5.1 cm

## 2021-05-24 MED ORDER — IOHEXOL 350 MG/ML SOLN
80.0000 mL | Freq: Once | INTRAVENOUS | Status: AC | PRN
  Administered 2021-05-24: 80 mL via INTRAVENOUS

## 2021-05-24 MED ORDER — PERFLUTREN LIPID MICROSPHERE
1.0000 mL | INTRAVENOUS | Status: AC | PRN
  Administered 2021-05-24: 2 mL via INTRAVENOUS

## 2021-05-28 ENCOUNTER — Other Ambulatory Visit (HOSPITAL_COMMUNITY): Payer: BC Managed Care – PPO

## 2021-06-03 ENCOUNTER — Encounter: Payer: Self-pay | Admitting: *Deleted

## 2021-06-12 ENCOUNTER — Telehealth: Payer: Self-pay | Admitting: Cardiology

## 2021-06-12 NOTE — Telephone Encounter (Signed)
Pt returning call for results... please advise  

## 2021-06-12 NOTE — Telephone Encounter (Signed)
Returned call to patient and reviewed results of echocardiogram with him. He verbalized understanding and agreement with plan of care and thanked me for the call.

## 2021-08-12 NOTE — Progress Notes (Signed)
Cardiology Office Note:    Date:  08/13/2021   ID:  Darius Rivers, DOB [birth year], MRN [id number]  PCP:  Fortino Sic, [place] Providers Cardiologist:  Freada Bergeron, MD Cardiology APP:  Sharmon Revere     Referring MD: Fortino Sic, *   Chief Complaint:  F/u for CHF, HTN    Patient Profile:   Darius Rivers is a 60 y.o. male with:  Coronary artery disease NSTEMI in 11/2018 Myoview 11/2018: Lg inf scar, EF 34 Med Rx given chronic kidney disease Cath 4/22: mLAD 15 (HFrEF) heart failure with reduced ejection fraction  Non-ischemic cardiomyopathy Echocardiogram 11/2018: EF 40-45 Echocardiogram 4/22:  EF 25-30 Echocardiogram 7/22: EF 40-45 Moderate aortic insufficiency (echocardiogram 7/22) >> rpt 1 year NSVT Chronic kidney disease Ascending thoracic aortic aneurysm Echocardiogram 4/22:  44 mm CT 7/22: 44 mm >> rpt 1 year  Lung nodule (3 mm LUL by CT in 7/22) >> repeat CT 1 year.  Hypertension Hyperlipidemia Prostate CA    Prior CV studies: Chest Aorta CTA 05/24/21 IMPRESSION: 4.4 cm ascending thoracic aortic aneurysm. 3 mm left upper lobe pulmonary nodule  Aortic Atherosclerosis (ICD10-I70.0) and Emphysema (ICD10-J43.9). Aortic aneurysm NOS (ICD10-I71.9).  Echocardiogram 05/24/21 EF 40-45, global HK, normal RVSF, mild LAE, mild MR, moderate AI, mild AV sclerosis without stenosis, aortic root 42 mm  RIGHT/LEFT HEART CATH AND CORONARY ANGIOGRAPHY 02/06/2021  Mid LAD lesion is 15% stenosed. RECOMMENDATION: Findings are consistent with a nonischemic cardiomyopathy.  Right heart pressures are extremely low.  Will gently hydrate post procedure in this patient with EF of 25 to 30%.   Echocardiogram 02/04/21 Diff HK, worse in inf base, EF 25-30, no RWMA, mild LVH, normal RVSF, mild LAE, trivial MR, mod AI, mild to mod AV sclerosis, no AS, mod dilation of ascending aorta (44 mm)   NM Myocar Multi W/Spect W/Wall Motion / EF  11/13/2018 1. No evidence of pharmacologic induced myocardial ischemia. Large inferior wall myocardial infarct. 2. Inferior wall akinesis of left ventricle, with moderate to severe dilatation. 3. Left ventricular ejection fraction 34% 4. Non invasive risk stratification*: High   Echocardiogram 11/12/2018 EF 40-45, mild to mod AI   History of Present Illness: Darius Rivers was last seen in 6/22.  Follow-up echocardiogram was obtained and demonstrated improved LV function.  His EF is now 40-45.  He has mild MR and moderate AI.  A repeat echo is scheduled for 1 year.  Follow-up CT did demonstrate 44 mm ascending thoracic aortic aneurysm as well as a 3 mm left upper lobe nodule.  He has a repeat CT scheduled in 1 year.  He returns for follow-up.  He is doing well without chest pain, shortness of breath, syncope, orthopnea, leg edema.  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        Past Medical History:  Diagnosis Date   Chronic kidney disease stage II    Coronary artery disease    NSTEMI in 11/2018 - Lg scar on Myoview; Rx medically due to CKD // Cath 4/22: mLAD 15 (no sig CAD)   HFrEF (heart failure with reduced EF)    NICM // Echocardiogram 11/2018: EF 40-45 // Echocardiogram 4/22:  EF 25-30 // Echo 7/22: EF 40-45, global HK normal RVSF, mild LAE, mild MR, moderate AI, mild AV sclerosis without stenosis, mild dilation of aortic root (42 mm)  Hyperlipidemia    Hypertension    Lung nodule    CT 7/22: Left upper lobe 3 mm> repeat chest CT in 1 year   Nephrolithiasis    NSVT (nonsustained ventricular tachycardia)    Prostate cancer ([place])    watchful waiting, due to go back to urology   Thoracic ascending aortic aneurysm    Echo 4/22: 44 mm // Chest CTA 7/22: Ascending thoracic aortic aneurysm 4.4 cm.  3 mm left upper lobe nodule.  Aortic atherosclerosis.   Current  Medications: Current Meds  Medication Sig   aspirin EC 81 MG tablet Take 81 mg by mouth daily.   carvedilol (COREG) 25 MG tablet Take 1 tablet (25 mg total) by mouth 2 (two) times daily with a meal.   empagliflozin (JARDIANCE) 10 MG TABS tablet Take 1 tablet (10 mg total) by mouth daily before breakfast.   ezetimibe (ZETIA) 10 MG tablet Take 1 tablet (10 mg total) by mouth daily.   isosorbide mononitrate (IMDUR) 60 MG 24 hr tablet Take 1 tablet (60 mg total) by mouth daily.   rosuvastatin (CRESTOR) 40 MG tablet Take 1 tablet (40 mg total) by mouth daily at 6 PM.   sacubitril-valsartan (ENTRESTO) 97-103 MG Take 1 tablet by mouth 2 (two) times daily.   spironolactone (ALDACTONE) 25 MG tablet Take 1 tablet (25 mg total) by mouth daily.   [DISCONTINUED] hydrALAZINE (APRESOLINE) 50 MG tablet Take 1 tablet (50 mg total) by mouth 3 (three) times daily.    Allergies:   Patient has no known allergies.   Social History   Tobacco Use   Smoking status: Former    Packs/day: 0.33    Years: 20.00    Pack years: 6.60    Types: Cigarettes    Quit date: 11/11/1998    Years since quitting: 22.7   Smokeless tobacco: Never  Vaping Use   Vaping Use: Never used  Substance Use Topics   Alcohol use: Yes    Comment: 11/15/2018 "mixed drink q 2-3 weeks; if that"   Drug use: Yes    Types: Marijuana    Comment: occasional use    Family Hx: The patient's family history includes High blood pressure in his mother. There is no history of Heart attack, Cancer, or Diabetes.  ROS see HPI  EKGs/Labs/Other Test Reviewed:    EKG:  EKG is not ordered today.  The ekg ordered today demonstrates n/a  Recent Labs: 02/03/2021: B Natriuretic Peptide 107.4 02/04/2021: TSH 0.637 02/06/2021: Magnesium 2.1 02/07/2021: Hemoglobin 12.7; Platelets 292 03/27/2021: ALT 10 04/30/2021: BUN 15; Creatinine, Ser 1.44; Potassium 3.8; Sodium 142   Recent Lipid Panel Lab Results  Component Value Date/Time   CHOL 123 03/27/2021 09:05 AM    TRIG 55 03/27/2021 09:05 AM   HDL 40 03/27/2021 09:05 AM   LDLCALC 71 03/27/2021 09:05 AM     Risk Assessment/Calculations:          Physical Exam:    VS:  BP (!) 150/80   Pulse 64   Ht 6' 4.5" (1.943 m)   Wt 186 lb 9.6 oz (84.6 kg)   SpO2 98%   BMI 22.42 kg/m     Wt Readings from Last 3 Encounters:  08/13/21 186 lb 9.6 oz (84.6 kg)  04/30/21 181 lb 3.2 oz (82.2 kg)  03/27/21 192 lb (87.1 kg)    Constitutional:      Appearance: Healthy appearance. Not in distress.  Neck:     Vascular: JVD normal.  Pulmonary:  Effort: Pulmonary effort is normal.     Breath sounds: No wheezing. No rales.  Cardiovascular:     Normal rate. Regular rhythm. Normal S1. Normal S2.      Murmurs: There is a grade 2/6 systolic murmur at the URSB.  Edema:    Peripheral edema absent.  Abdominal:     Palpations: Abdomen is soft. There is no hepatomegaly.  Skin:    General: Skin is warm and dry.  Neurological:     General: No focal deficit present.     Mental Status: Alert and oriented to person, place and time.     Cranial Nerves: Cranial nerves are intact.         ASSESSMENT & PLAN:   1. HFrEF (heart failure with reduced ejection fraction) (HCC) EF has improved to 40-45.  Nonischemic cardiomyopathy.  NYHA II.  Volume status stable.  Continue carvedilol 25 mg twice daily, empagliflozin 10 mg daily, isosorbide mononitrate 60 mg daily, Entresto 97/103 mg twice daily, spironolactone 25 mg daily.  Increase hydralazine to 75 mg 3 times a day for better blood pressure control.  2. Stage 3a chronic kidney disease (HCC) Recent creatinine stable at 1.44.  3. Aneurysm of ascending aorta without rupture 44 mm by recent chest CT.  Plan follow-up CT in 05/2022.  4. Essential hypertension Blood pressure above target.  Increase hydralazine to 75 mg 3 times a day.  Continue carvedilol 25 mg twice daily, isosorbide mononitrate 60 mg daily, Entresto 97/103 mg twice daily, spironolactone 25 mg daily.   Follow-up in the Pharm.D. hypertension clinic in 1 month.  We discussed the importance of low-salt diet.  I have asked him to bring his blood pressure cuff to his next appointment.  5. Lung nodule 3 mm left upper lobe nodule by recent CT.  Repeat CT will be obtained in 05/2022.  6. Aortic insufficiency Repeat echo will be obtained in 05/2022.      Dispo:  Return in about 6 months (around 02/11/2022) for Routine Follow Up, w/ Dr. Johney Frame, or Richardson Dopp, PA-C.   Medication Adjustments/Labs and Tests Ordered: Current medicines are reviewed at length with the patient today.  Concerns regarding medicines are outlined above.  Tests Ordered: Orders Placed This Encounter  Procedures   AMB Referral to [facility] Pharm-D    Medication Changes: Meds ordered this encounter  Medications   hydrALAZINE (APRESOLINE) 50 MG tablet    Sig: Take 1.5 tablets (75 mg total) by mouth 3 (three) times daily.    Dispense:  135 tablet    Refill:  [id number], Richardson Dopp, PA-C  08/13/2021 11:02 AM    [facility] [place], [place], [place]  [address] Phone: [phone]; Fax: [phone]

## 2021-08-13 ENCOUNTER — Other Ambulatory Visit: Payer: Self-pay

## 2021-08-13 ENCOUNTER — Ambulatory Visit (INDEPENDENT_AMBULATORY_CARE_PROVIDER_SITE_OTHER): Payer: BC Managed Care – PPO | Admitting: Physician Assistant

## 2021-08-13 ENCOUNTER — Encounter: Payer: Self-pay | Admitting: Physician Assistant

## 2021-08-13 VITALS — BP 150/80 | HR 64 | Ht 76.5 in | Wt 186.6 lb

## 2021-08-13 DIAGNOSIS — I5020 Unspecified systolic (congestive) heart failure: Secondary | ICD-10-CM

## 2021-08-13 DIAGNOSIS — E782 Mixed hyperlipidemia: Secondary | ICD-10-CM

## 2021-08-13 DIAGNOSIS — I7121 Aneurysm of the ascending aorta, without rupture: Secondary | ICD-10-CM | POA: Diagnosis not present

## 2021-08-13 DIAGNOSIS — I10 Essential (primary) hypertension: Secondary | ICD-10-CM | POA: Diagnosis not present

## 2021-08-13 DIAGNOSIS — R911 Solitary pulmonary nodule: Secondary | ICD-10-CM

## 2021-08-13 DIAGNOSIS — N1831 Chronic kidney disease, stage 3a: Secondary | ICD-10-CM

## 2021-08-13 DIAGNOSIS — I351 Nonrheumatic aortic (valve) insufficiency: Secondary | ICD-10-CM

## 2021-08-13 MED ORDER — HYDRALAZINE HCL 50 MG PO TABS: 75.0000 mg | ORAL_TABLET | Freq: Three times a day (TID) | ORAL | 11 refills | Status: DC

## 2021-08-13 NOTE — Patient Instructions (Addendum)
Medication Instructions:   INCREASE Hydralazine one and one half tablet by mouth ( 75 mg) three times daily.  *If you need a refill on your cardiac medications before your next appointment, please call your pharmacy*  Lab Work:  -NONE  If you have labs (blood work) drawn today and your tests are completely normal, you will receive your results only by: [place] (if you have MyChart) OR A paper copy in the mail If you have any lab test that is abnormal or we need to change your treatment, we will call you to review the results.  Testing/Procedures:  -NONE-  Follow-Up: At [facility], you and your health needs are our priority.  As part of our continuing mission to provide you with exceptional heart care, we have created designated Provider Care Teams.  These Care Teams include your primary Cardiologist (physician) and Advanced Practice Providers (APPs -  Physician Assistants and Nurse Practitioners) who all work together to provide you with the care you need, when you need it.  We recommend signing up for the patient portal called "MyChart".  Sign up information is provided on this After Visit Summary.  MyChart is used to connect with patients for Virtual Visits (Telemedicine).  Patients are able to view lab/test results, encounter notes, upcoming appointments, etc.  Non-urgent messages can be sent to your provider as well.   To learn more about what you can do with MyChart, go to [email or web address].    Your next appointment:   6 month(s)  The format for your next appointment:   In Person  Provider:   You may see Freada Bergeron, MD or one of the following Advanced Practice Providers on your designated Care Team:   Richardson Dopp, PA-C   Other Instructions You have been referred to Pharmacist for Hypertension please bring your blood pressure cuff with you day of appointment.   Your physician wants you to follow-up in: 6 months with Dr. Johney Frame or Richardson Dopp,  PA-C.  You will receive a reminder letter in the mail two months in advance. If you don't receive a letter, please call our office to schedule the follow-up appointment.   Low-Sodium Eating Plan Sodium, which is an element that makes up salt, helps you maintain a healthy balance of fluids in your body. Too much sodium can increase your blood pressure and cause fluid and waste to be held in your body. Your health care provider or dietitian may recommend following this plan if you have high blood pressure (hypertension), kidney disease, liver disease, or heart failure. Eating less sodium can help lower your blood pressure, reduce swelling, and protect your heart, liver, and kidneys. What are tips for following this plan? Reading food labels The Nutrition Facts label lists the amount of sodium in one serving of the food. If you eat more than one serving, you must multiply the listed amount of sodium by the number of servings. Choose foods with less than 140 mg of sodium per serving. Avoid foods with 300 mg of sodium or more per serving. Shopping  Look for lower-sodium products, often labeled as "low-sodium" or "no salt added." Always check the sodium content, even if foods are labeled as "unsalted" or "no salt added." Buy fresh foods. Avoid canned foods and pre-made or frozen meals. Avoid canned, cured, or processed meats. Buy breads that have less than 80 mg of sodium per slice. Cooking  Eat more home-cooked food and less restaurant, buffet, and fast food. Avoid adding salt when  cooking. Use salt-free seasonings or herbs instead of table salt or sea salt. Check with your health care provider or pharmacist before using salt substitutes. Cook with plant-based oils, such as canola, sunflower, or olive oil. Meal planning When eating at a restaurant, ask that your food be prepared with less salt or no salt, if possible. Avoid dishes labeled as brined, pickled, cured, smoked, or made with soy sauce,  miso, or teriyaki sauce. Avoid foods that contain MSG (monosodium glutamate). MSG is sometimes added to [place] food, bouillon, and some canned foods. Make meals that can be grilled, baked, poached, roasted, or steamed. These are generally made with less sodium. General information Most people on this plan should limit their sodium intake to 1,500-2,000 mg (milligrams) of sodium each day. What foods should I eat? Fruits Fresh, frozen, or canned fruit. Fruit juice. Vegetables Fresh or frozen vegetables. "No salt added" canned vegetables. "No salt added" tomato sauce and paste. Low-sodium or reduced-sodium tomato and vegetable juice. Grains Low-sodium cereals, including oats, puffed wheat and rice, and shredded wheat. Low-sodium crackers. Unsalted rice. Unsalted pasta. Low-sodium bread. Whole-grain breads and whole-grain pasta. Meats and other proteins Fresh or frozen (no salt added) meat, poultry, seafood, and fish. Low-sodium canned tuna and salmon. Unsalted nuts. Dried peas, beans, and lentils without added salt. Unsalted canned beans. Eggs. Unsalted nut butters. Dairy Milk. Soy milk. Cheese that is naturally low in sodium, such as ricotta cheese, fresh mozzarella, or Swiss cheese. Low-sodium or reduced-sodium cheese. Cream cheese. Yogurt. Seasonings and condiments Fresh and dried herbs and spices. Salt-free seasonings. Low-sodium mustard and ketchup. Sodium-free salad dressing. Sodium-free light mayonnaise. Fresh or refrigerated horseradish. Lemon juice. Vinegar. Other foods Homemade, reduced-sodium, or low-sodium soups. Unsalted popcorn and pretzels. Low-salt or salt-free chips. The items listed above may not be a complete list of foods and beverages you can eat. Contact a dietitian for more information. What foods should I avoid? Vegetables Sauerkraut, pickled vegetables, and relishes. Olives. [place] fries. Onion rings. Regular canned vegetables (not low-sodium or reduced-sodium). Regular  canned tomato sauce and paste (not low-sodium or reduced-sodium). Regular tomato and vegetable juice (not low-sodium or reduced-sodium). Frozen vegetables in sauces. Grains Instant hot cereals. Bread stuffing, pancake, and biscuit mixes. Croutons. Seasoned rice or pasta mixes. Noodle soup cups. Boxed or frozen macaroni and cheese. Regular salted crackers. Self-rising flour. Meats and other proteins Meat or fish that is salted, canned, smoked, spiced, or pickled. Precooked or cured meat, such as sausages or meat loaves. Berniece Salines. Ham. Pepperoni. Hot dogs. Corned beef. Chipped beef. Salt pork. Jerky. Pickled herring. Anchovies and sardines. Regular canned tuna. Salted nuts. Dairy Processed cheese and cheese spreads. Hard cheeses. Cheese curds. Blue cheese. Feta cheese. String cheese. Regular cottage cheese. Buttermilk. Canned milk. Fats and oils Salted butter. Regular margarine. Ghee. Bacon fat. Seasonings and condiments Onion salt, garlic salt, seasoned salt, table salt, and sea salt. Canned and packaged gravies. Worcestershire sauce. Tartar sauce. Barbecue sauce. Teriyaki sauce. Soy sauce, including reduced-sodium. Steak sauce. Fish sauce. Oyster sauce. Cocktail sauce. Horseradish that you find on the shelf. Regular ketchup and mustard. Meat flavorings and tenderizers. Bouillon cubes. Hot sauce. Pre-made or packaged marinades. Pre-made or packaged taco seasonings. Relishes. Regular salad dressings. Salsa. Other foods Salted popcorn and pretzels. Corn chips and puffs. Potato and tortilla chips. Canned or dried soups. Pizza. Frozen entrees and pot pies. The items listed above may not be a complete list of foods and beverages you should avoid. Contact a dietitian for more information. Summary Eating  less sodium can help lower your blood pressure, reduce swelling, and protect your heart, liver, and kidneys. Most people on this plan should limit their sodium intake to 1,500-2,000 mg (milligrams) of sodium  each day. Canned, boxed, and frozen foods are high in sodium. Restaurant foods, fast foods, and pizza are also very high in sodium. You also get sodium by adding salt to food. Try to cook at home, eat more fresh fruits and vegetables, and eat less fast food and canned, processed, or prepared foods. This information is not intended to replace advice given to you by your health care provider. Make sure you discuss any questions you have with your health care provider. Document Revised: 11/25/2019 Document Reviewed: 09/21/2019 Elsevier Patient Education  2022 [employer].

## 2021-09-19 ENCOUNTER — Ambulatory Visit (INDEPENDENT_AMBULATORY_CARE_PROVIDER_SITE_OTHER): Payer: BC Managed Care – PPO | Admitting: Pharmacist

## 2021-09-19 ENCOUNTER — Telehealth: Payer: Self-pay | Admitting: Pharmacist

## 2021-09-19 ENCOUNTER — Other Ambulatory Visit: Payer: Self-pay

## 2021-09-19 VITALS — BP 138/90

## 2021-09-19 DIAGNOSIS — I5042 Chronic combined systolic (congestive) and diastolic (congestive) heart failure: Secondary | ICD-10-CM | POA: Diagnosis not present

## 2021-09-19 DIAGNOSIS — I10 Essential (primary) hypertension: Secondary | ICD-10-CM | POA: Diagnosis not present

## 2021-09-19 MED ORDER — HYDRALAZINE HCL 100 MG PO TABS: 100.0000 mg | ORAL_TABLET | Freq: Three times a day (TID) | ORAL | 3 refills | Status: DC

## 2021-09-19 NOTE — Patient Instructions (Addendum)
You should be taking your Entresto and carvedilol 2 times a day You should be taking hydralazine 3 times a day Please call me at [phone] to let me know how you are taking  Please start checking your blood pressure at home. Take your blood pressure 2 time twice a day. Please write these readings down and bring with you to your next appointment.

## 2021-09-19 NOTE — Telephone Encounter (Signed)
Patient called back. States he has been taking Entresto and carvedilol twice a day. Will increase hydralazine to 100mg  TID. Pt aware he can take 2 of the hydralazine 50mg  three times a day until he runs out. New Rx for hydralazine 100mg  sent to pharmacy.

## 2021-09-19 NOTE — Progress Notes (Signed)
Patient ID: Darius Rivers                 DOB: 1960/12/03                      MRN: [id number]     HPI: Darius Rivers is a 60 y.o. male patient of Dr. Kristine Garbe referred by Richardson Dopp, PA to HTN clinic. PMH is significant for CAD s/p NSTEMI in 2020, HFrEF (40-45%), NSVT, CKD, ascending thoracic aortic aneurysm, HTN, HLD and prostate cancer. Patient was seen by Nicki Reaper 10/11. BP was above goal and hydralazine was increased to 75mg  three times a day.  Patient presents to HTN clinic today. He denies dizziness, lightheadedness, headache, blurred vision, SOB or swelling. He has not been checking his BP at home, but did get his blood pressure cuff back from his sister and brought to clinic today to have it checked. He thought there was only one medication that he was taking twice a day.   Home cuff is a Reli-on upper arm cuff 1st home cuff reading: 141/85 2nd home cuff reading: 136/86 Clinic reading: 138/90 3rd home BP cuff: 131/84 Average: 133/85 Home BP cuff is accurate  Current HTN meds: carvedilol 25 mg twice daily, empagliflozin 10 mg daily, isosorbide mononitrate 60 mg daily, Entresto 97/103 mg twice daily, spironolactone 25 mg daily, hydralazine 75 mg 3 times a day BP goal: <130/80  Family History:  Family History  Problem Relation Age of Onset   High blood pressure Mother    Heart attack Neg Hx    Cancer Neg Hx    Diabetes Neg Hx     Social History: 1-2 drinks a week, former smoker- quite 20 years ago, no tobacco, no illict drugs   Diet: 1 cup of coffee per day, very limited 1-2 sodas per week, drinks water  Exercise: treadmill, light lifting 3-4 times a week  Home BP readings: none available  Wt Readings from Last 3 Encounters:  08/13/21 186 lb 9.6 oz (84.6 kg)  04/30/21 181 lb 3.2 oz (82.2 kg)  03/27/21 192 lb (87.1 kg)   BP Readings from Last 3 Encounters:  08/13/21 (!) 150/80  04/30/21 (!) 130/92  03/27/21 (!) 146/90   Pulse Readings from Last 3 Encounters:   08/13/21 64  04/30/21 68  03/27/21 (!) 56    Renal function: CrCl cannot be calculated (Patient's most recent lab result is older than the maximum 21 days allowed.).  Past Medical History:  Diagnosis Date   Chronic kidney disease stage II    Coronary artery disease    NSTEMI in 11/2018 - Lg scar on Myoview; Rx medically due to CKD // Cath 4/22: mLAD 15 (no sig CAD)   HFrEF (heart failure with reduced EF)    NICM // Echocardiogram 11/2018: EF 40-45 // Echocardiogram 4/22:  EF 25-30 // Echo 7/22: EF 40-45, global HK normal RVSF, mild LAE, mild MR, moderate AI, mild AV sclerosis without stenosis, mild dilation of aortic root (42 mm)   Hyperlipidemia    Hypertension    Lung nodule    CT 7/22: Left upper lobe 3 mm> repeat chest CT in 1 year   Nephrolithiasis    NSVT (nonsustained ventricular tachycardia)    Prostate cancer ([place])    watchful waiting, due to go back to urology   Thoracic ascending aortic aneurysm    Echo 4/22: 44 mm // Chest CTA 7/22: Ascending thoracic aortic aneurysm 4.4 cm.  3 mm left upper  lobe nodule.  Aortic atherosclerosis.    Current Outpatient Medications on File Prior to Visit  Medication Sig Dispense Refill   aspirin EC 81 MG tablet Take 81 mg by mouth daily.     carvedilol (COREG) 25 MG tablet Take 1 tablet (25 mg total) by mouth 2 (two) times daily with a meal. 180 tablet 3   empagliflozin (JARDIANCE) 10 MG TABS tablet Take 1 tablet (10 mg total) by mouth daily before breakfast. 90 tablet 3   ezetimibe (ZETIA) 10 MG tablet Take 1 tablet (10 mg total) by mouth daily. 90 tablet 3   hydrALAZINE (APRESOLINE) 50 MG tablet Take 1.5 tablets (75 mg total) by mouth 3 (three) times daily. 135 tablet 11   isosorbide mononitrate (IMDUR) 60 MG 24 hr tablet Take 1 tablet (60 mg total) by mouth daily. 90 tablet 3   rosuvastatin (CRESTOR) 40 MG tablet Take 1 tablet (40 mg total) by mouth daily at 6 PM. 90 tablet 3   sacubitril-valsartan (ENTRESTO) 97-103 MG Take 1 tablet by  mouth 2 (two) times daily. 180 tablet 3   spironolactone (ALDACTONE) 25 MG tablet Take 1 tablet (25 mg total) by mouth daily. 90 tablet 3   No current facility-administered medications on file prior to visit.    No Known Allergies  There were no vitals taken for this visit.   Assessment/Plan:  1. Hypertension - BP is above goal of <130/80 in clinic today. He is not sure if he has been taking both the carvedilol and the Entresto twice a day. Thought he was only taking 1 medication twice a day and the hydralazine 3 times a day. He will check when he gets home and let me know. If he has only been taking either Entresto or carvedilol once a day, then I will have him resume twice a day and follow up in 2.5 weeks. If he has been taking twice a day, then will plan to increase hydralazine to 100mg  TID and follow up in 2.5 weeks. Will await return call from patient. Patient was taught proper technique for checking his BP. I have asked him to check 2 times, twice daily and bring his readings with him to his next appointment.   Thank you  Ramond Dial, Pharm.D, BCPS, CPP [place]  [address], [place], [place] [address]  Phone: [phone]; Fax: [phone]

## 2021-10-08 ENCOUNTER — Ambulatory Visit: Payer: BC Managed Care – PPO

## 2021-10-08 NOTE — Progress Notes (Deleted)
Patient ID: Darius Rivers                 DOB: Nov 05, 1960                      MRN: [id number]     HPI: Darius Rivers is a 60 y.o. male patient of Dr. Kristine Garbe referred by Richardson Dopp, PA to HTN clinic. PMH is significant for CAD s/p NSTEMI in 2020, HFrEF (40-45%), NSVT, CKD, ascending thoracic aortic aneurysm, HTN, HLD and prostate cancer. Patient was seen by Nicki Reaper 10/11. BP was above goal and hydralazine was increased to 75mg  three times a day.  At last visit with PharmD, BP was 138/90. Hydralazine was increased to 100mg  three times a day.   Dizziness, lightheadedness, headache, blurred vision, SOB, swelling Home BP HR? Increase carvedilol? Arlyce Harman?  Patient presents to HTN clinic today. He denies dizziness, lightheadedness, headache, blurred vision, SOB or swelling. He has not been checking his BP at home, but did get his blood pressure cuff back from his sister and brought to clinic today to have it checked. He thought there was only one medication that he was taking twice a day.   Home cuff is a Reli-on upper arm cuff 1st home cuff reading: 141/85 2nd home cuff reading: 136/86 Clinic reading: 138/90 3rd home BP cuff: 131/84 Average: 133/85 Home BP cuff is accurate  Current HTN meds: carvedilol 25 mg twice daily, empagliflozin 10 mg daily, isosorbide mononitrate 60 mg daily, Entresto 97/103 mg twice daily, spironolactone 25 mg daily, hydralazine 100 mg 3 times a day BP goal: <130/80  Family History:  Family History  Problem Relation Age of Onset   High blood pressure Mother    Heart attack Neg Hx    Cancer Neg Hx    Diabetes Neg Hx     Social History: 1-2 drinks a week, former smoker- quite 20 years ago, no tobacco, no illict drugs   Diet: 1 cup of coffee per day, very limited 1-2 sodas per week, drinks water  Exercise: treadmill, light lifting 3-4 times a week  Home BP readings: none available  Wt Readings from Last 3 Encounters:  08/13/21 186 lb 9.6 oz (84.6  kg)  04/30/21 181 lb 3.2 oz (82.2 kg)  03/27/21 192 lb (87.1 kg)   BP Readings from Last 3 Encounters:  09/19/21 138/90  08/13/21 (!) 150/80  04/30/21 (!) 130/92   Pulse Readings from Last 3 Encounters:  08/13/21 64  04/30/21 68  03/27/21 (!) 56    Renal function: CrCl cannot be calculated (Patient's most recent lab result is older than the maximum 21 days allowed.).  Past Medical History:  Diagnosis Date   Chronic kidney disease stage II    Coronary artery disease    NSTEMI in 11/2018 - Lg scar on Myoview; Rx medically due to CKD // Cath 4/22: mLAD 15 (no sig CAD)   HFrEF (heart failure with reduced EF)    NICM // Echocardiogram 11/2018: EF 40-45 // Echocardiogram 4/22:  EF 25-30 // Echo 7/22: EF 40-45, global HK normal RVSF, mild LAE, mild MR, moderate AI, mild AV sclerosis without stenosis, mild dilation of aortic root (42 mm)   Hyperlipidemia    Hypertension    Lung nodule    CT 7/22: Left upper lobe 3 mm> repeat chest CT in 1 year   Nephrolithiasis    NSVT (nonsustained ventricular tachycardia)    Prostate cancer ([place])    watchful waiting, due to  go back to urology   Thoracic ascending aortic aneurysm    Echo 4/22: 44 mm // Chest CTA 7/22: Ascending thoracic aortic aneurysm 4.4 cm.  3 mm left upper lobe nodule.  Aortic atherosclerosis.    Current Outpatient Medications on File Prior to Visit  Medication Sig Dispense Refill   aspirin EC 81 MG tablet Take 81 mg by mouth daily.     carvedilol (COREG) 25 MG tablet Take 1 tablet (25 mg total) by mouth 2 (two) times daily with a meal. 180 tablet 3   empagliflozin (JARDIANCE) 10 MG TABS tablet Take 1 tablet (10 mg total) by mouth daily before breakfast. 90 tablet 3   ezetimibe (ZETIA) 10 MG tablet Take 1 tablet (10 mg total) by mouth daily. 90 tablet 3   hydrALAZINE (APRESOLINE) 100 MG tablet Take 1 tablet (100 mg total) by mouth 3 (three) times daily. 270 tablet 3   isosorbide mononitrate (IMDUR) 60 MG 24 hr tablet Take 1  tablet (60 mg total) by mouth daily. 90 tablet 3   rosuvastatin (CRESTOR) 40 MG tablet Take 1 tablet (40 mg total) by mouth daily at 6 PM. 90 tablet 3   sacubitril-valsartan (ENTRESTO) 97-103 MG Take 1 tablet by mouth 2 (two) times daily. 180 tablet 3   spironolactone (ALDACTONE) 25 MG tablet Take 1 tablet (25 mg total) by mouth daily. 90 tablet 3   No current facility-administered medications on file prior to visit.    No Known Allergies  There were no vitals taken for this visit.   Assessment/Plan:  1. Hypertension - BP is above goal of <130/80 in clinic today. He is not sure if he has been taking both the carvedilol and the Entresto twice a day. Thought he was only taking 1 medication twice a day and the hydralazine 3 times a day. He will check when he gets home and let me know. If he has only been taking either Entresto or carvedilol once a day, then I will have him resume twice a day and follow up in 2.5 weeks. If he has been taking twice a day, then will plan to increase hydralazine to 100mg  TID and follow up in 2.5 weeks. Will await return call from patient. Patient was taught proper technique for checking his BP. I have asked him to check 2 times, twice daily and bring his readings with him to his next appointment.   Thank you  Ramond Dial, Pharm.D, BCPS, CPP [place]  [address], [place], [place] [address]  Phone: [phone]; Fax: [phone]

## 2022-04-16 ENCOUNTER — Other Ambulatory Visit: Payer: Self-pay

## 2022-04-16 MED ORDER — SPIRONOLACTONE 25 MG PO TABS: 25.0000 mg | ORAL_TABLET | Freq: Every day | ORAL | 0 refills | Status: DC

## 2022-05-12 ENCOUNTER — Other Ambulatory Visit: Payer: Self-pay

## 2022-05-12 MED ORDER — ISOSORBIDE MONONITRATE ER 60 MG PO TB24: 60.0000 mg | ORAL_TABLET | Freq: Every day | ORAL | 0 refills | Status: DC

## 2022-05-12 MED ORDER — CARVEDILOL 25 MG PO TABS: 25.0000 mg | ORAL_TABLET | Freq: Two times a day (BID) | ORAL | 0 refills | Status: DC

## 2022-05-13 ENCOUNTER — Other Ambulatory Visit: Payer: Self-pay

## 2022-05-13 MED ORDER — ROSUVASTATIN CALCIUM 40 MG PO TABS: 40.0000 mg | ORAL_TABLET | Freq: Every day | ORAL | 0 refills | Status: DC

## 2022-05-17 ENCOUNTER — Other Ambulatory Visit: Payer: Self-pay | Admitting: Physician Assistant

## 2022-05-28 ENCOUNTER — Other Ambulatory Visit: Payer: Self-pay | Admitting: Physician Assistant

## 2022-07-10 ENCOUNTER — Encounter: Payer: Self-pay | Admitting: *Deleted

## 2022-08-08 ENCOUNTER — Other Ambulatory Visit: Payer: Self-pay

## 2022-08-08 MED ORDER — CARVEDILOL 25 MG PO TABS: 25.0000 mg | ORAL_TABLET | Freq: Two times a day (BID) | ORAL | 0 refills | Status: DC

## 2022-08-08 MED ORDER — ROSUVASTATIN CALCIUM 40 MG PO TABS: 40.0000 mg | ORAL_TABLET | Freq: Every day | ORAL | 0 refills | Status: DC

## 2022-08-08 MED ORDER — ISOSORBIDE MONONITRATE ER 60 MG PO TB24: 60.0000 mg | ORAL_TABLET | Freq: Every day | ORAL | 0 refills | Status: DC

## 2022-08-22 NOTE — Progress Notes (Unsigned)
Cardiology Office Note:    Date:  08/22/2022   ID:  Darius Rivers, DOB 11-01-1961, MRN [id number]  PCP:  Fortino Sic, [place] Providers Cardiologist:  Freada Bergeron, MD Cardiology APP:  Liliane Shi, PA-C { Click to update primary MD,subspecialty MD or APP then REFRESH:1}    Referring MD: Fortino Sic, *   No chief complaint on file. ***  History of Present Illness:    Darius Rivers is a 61 y.o. male with a hx of ***  Past Medical History:  Diagnosis Date   Chronic kidney disease stage II    Coronary artery disease    NSTEMI in 11/2018 - Lg scar on Myoview; Rx medically due to CKD // Cath 4/22: mLAD 15 (no sig CAD)   HFrEF (heart failure with reduced EF)    NICM // Echocardiogram 11/2018: EF 40-45 // Echocardiogram 4/22:  EF 25-30 // Echo 7/22: EF 40-45, global HK normal RVSF, mild LAE, mild MR, moderate AI, mild AV sclerosis without stenosis, mild dilation of aortic root (42 mm)   Hyperlipidemia    Hypertension    Lung nodule    CT 7/22: Left upper lobe 3 mm> repeat chest CT in 1 year   Nephrolithiasis    NSVT (nonsustained ventricular tachycardia)    Prostate cancer ([place])    watchful waiting, due to go back to urology   Thoracic ascending aortic aneurysm    Echo 4/22: 44 mm // Chest CTA 7/22: Ascending thoracic aortic aneurysm 4.4 cm.  3 mm left upper lobe nodule.  Aortic atherosclerosis.    Past Surgical History:  Procedure Laterality Date   CYSTOSCOPY WITH URETEROSCOPY, STONE BASKETRY AND STENT PLACEMENT     "put a stent in; pulled stent out later"   INGUINAL HERNIA REPAIR Bilateral 1980s   PROSTATE BIOPSY     RIGHT/LEFT HEART CATH AND CORONARY ANGIOGRAPHY N/A 02/06/2021   Procedure: RIGHT/LEFT HEART CATH AND CORONARY ANGIOGRAPHY;  Surgeon: Troy Sine, MD;  Location: [facility];  Service: Cardiovascular;  Laterality: N/A;    Current Medications: No outpatient medications have been marked as taking for  the 08/25/22 encounter (Appointment) with Freada Bergeron, MD.     Allergies:   Patient has no known allergies.   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  Tobacco Use   Smoking status: Former    Packs/day: 0.33    Years: 20.00    Total pack years: 6.60    Types: Cigarettes    Quit date: 11/11/1998    Years since quitting: 23.7   Smokeless tobacco: Never  Vaping Use   Vaping Use: Never used  Substance and Sexual Activity   Alcohol use: Yes    Comment: 11/15/2018 "mixed drink q 2-3 weeks; if that"   Drug use: Yes    Types: Marijuana    Comment: occasional use   Sexual activity: Yes  Other Topics Concern   Not on file  Social History Narrative   Not on file   Social Determinants of Health   Financial Resource Strain: Not on file  Food Insecurity: Not on  file  Transportation Needs: Not on file  Physical Activity: Not on file  Stress: Not on file  Social Connections: Not on file     Family History: The patient's ***family history includes High blood pressure in his mother. There is no history of Heart attack, Cancer, or Diabetes.  ROS:   Please see the history of present illness.    *** All other systems reviewed and are negative.  EKGs/Labs/Other Studies Reviewed:    The following studies were reviewed today: ***  EKG:  EKG is *** ordered today.  The ekg ordered today demonstrates ***  Recent Labs: No results found for requested labs within last 365 days.  Recent Lipid Panel    Component Value Date/Time   CHOL 123 03/27/2021 0905   TRIG 55 03/27/2021 0905   HDL 40 03/27/2021 0905   CHOLHDL 3.1 03/27/2021 0905   LDLCALC 71 03/27/2021 0905     Risk  Assessment/Calculations:   {Does this patient have ATRIAL FIBRILLATION?:[phone]}  No BP recorded.  {Refresh Note OR Click here to enter BP  :1}***         Physical Exam:    VS:  There were no vitals taken for this visit.    Wt Readings from Last 3 Encounters:  08/13/21 186 lb 9.6 oz (84.6 kg)  04/30/21 181 lb 3.2 oz (82.2 kg)  03/27/21 192 lb (87.1 kg)     GEN: *** Well nourished, well developed in no acute distress HEENT: Normal NECK: No JVD; No carotid bruits LYMPHATICS: No lymphadenopathy CARDIAC: ***RRR, no murmurs, rubs, gallops RESPIRATORY:  Clear to auscultation without rales, wheezing or rhonchi  ABDOMEN: Soft, non-tender, non-distended MUSCULOSKELETAL:  No edema; No deformity  SKIN: Warm and dry NEUROLOGIC:  Alert and oriented x 3 PSYCHIATRIC:  Normal affect   ASSESSMENT:    No diagnosis found. PLAN:    In order of problems listed above:  ***      {Are you ordering a CV Procedure (e.g. stress test, cath, DCCV, TEE, etc)?   Press F2        :[id number]}    Medication Adjustments/Labs and Tests Ordered: Current medicines are reviewed at length with the patient today.  Concerns regarding medicines are outlined above.  No orders of the defined types were placed in this encounter.  No orders of the defined types were placed in this encounter.   There are no Patient Instructions on file for this visit.   Signed, Freada Bergeron, MD  08/22/2022 8:41 AM    [place]

## 2022-08-25 ENCOUNTER — Ambulatory Visit: Payer: BC Managed Care – PPO | Attending: Cardiology | Admitting: Cardiology

## 2022-08-25 ENCOUNTER — Encounter: Payer: Self-pay | Admitting: Cardiology

## 2022-08-25 VITALS — BP 170/105 | HR 68 | Ht 76.0 in | Wt 187.4 lb

## 2022-08-25 DIAGNOSIS — I5042 Chronic combined systolic (congestive) and diastolic (congestive) heart failure: Secondary | ICD-10-CM

## 2022-08-25 DIAGNOSIS — I7121 Aneurysm of the ascending aorta, without rupture: Secondary | ICD-10-CM

## 2022-08-25 DIAGNOSIS — Z79899 Other long term (current) drug therapy: Secondary | ICD-10-CM

## 2022-08-25 DIAGNOSIS — I10 Essential (primary) hypertension: Secondary | ICD-10-CM

## 2022-08-25 DIAGNOSIS — I351 Nonrheumatic aortic (valve) insufficiency: Secondary | ICD-10-CM | POA: Diagnosis not present

## 2022-08-25 DIAGNOSIS — N1831 Chronic kidney disease, stage 3a: Secondary | ICD-10-CM

## 2022-08-25 DIAGNOSIS — I2510 Atherosclerotic heart disease of native coronary artery without angina pectoris: Secondary | ICD-10-CM

## 2022-08-25 DIAGNOSIS — E782 Mixed hyperlipidemia: Secondary | ICD-10-CM

## 2022-08-25 MED ORDER — ENTRESTO 97-103 MG PO TABS: 1.0000 | ORAL_TABLET | Freq: Two times a day (BID) | ORAL | 3 refills | Status: DC

## 2022-08-25 MED ORDER — EMPAGLIFLOZIN 10 MG PO TABS: 10.0000 mg | ORAL_TABLET | Freq: Every day | ORAL | 3 refills | Status: DC

## 2022-08-25 MED ORDER — ISOSORBIDE MONONITRATE ER 30 MG PO TB24: 90.0000 mg | ORAL_TABLET | Freq: Two times a day (BID) | ORAL | 4 refills | Status: DC

## 2022-08-25 NOTE — Progress Notes (Signed)
Cardiology Office Note:    Date:  08/25/2022   ID:  Darius Rivers, DOB 05-07-1961, MRN [id number]  PCP:  Fortino Sic, [place] Providers Cardiologist:  Freada Bergeron, MD Cardiology APP:  Sharmon Revere  {  Referring MD: Fortino Sic, *    History of Present Illness:    Darius Rivers is a 61 y.o. male with a hx of nonobstructive CAD, NICM, HFrEF with LVEF 40-45%, NSVT, ascending aortic aneurysm, HTN, HLD, CKD and prostate cancer who presents to clinic for follow-up.  Per records, he has a history of NICM and chronic systolic HF. EF has been as low as 20%. Per prior notes, he had a LHC which showed nonobstructive CAD. He also had NSVT and was treated with amiodarone. He was referred to EP given persisetently low EF despite medical therapy and was considered for ICD. He declined ICD at that time given lack of insurance. However, he had a repeat echo in 2017 that did show improvement in LVEF to 40-45%. Moderate LVH was noted.  He also has an ascending aortic aneurysm. Last CT scan 05/2021 with max diameter 4.4cm.  Was last seen in follow-up by Richardson Dopp 08/2021 where he was doing well.  Today, he states he has been feeling well. Has run out of two of his medications and states "he is having trouble getting them refilled." He does not know the names of the medications he does not have but suspect it is entresto and jardiance. He has not been checking his blood pressure at home. His blood pressure today in clinic was 200/120. Upon recheck, his blood pressure was 170/105. He denies any palpitations, chest pain, shortness of breath, or peripheral edema. No lightheadedness, headaches, syncope, orthopnea, or PND. Does not have a BP cuff but will go buy one.   Past Medical History:  Diagnosis Date   Chronic kidney disease stage II    Coronary artery disease    NSTEMI in 11/2018 - Lg scar on Myoview; Rx medically due to CKD // Cath 4/22: mLAD  15 (no sig CAD)   HFrEF (heart failure with reduced EF)    NICM // Echocardiogram 11/2018: EF 40-45 // Echocardiogram 4/22:  EF 25-30 // Echo 7/22: EF 40-45, global HK normal RVSF, mild LAE, mild MR, moderate AI, mild AV sclerosis without stenosis, mild dilation of aortic root (42 mm)   Hyperlipidemia    Hypertension    Lung nodule    CT 7/22: Left upper lobe 3 mm> repeat chest CT in 1 year   Nephrolithiasis    NSVT (nonsustained ventricular tachycardia)    Prostate cancer ([place])    watchful waiting, due to go back to urology   Thoracic ascending aortic aneurysm    Echo 4/22: 44 mm // Chest CTA 7/22: Ascending thoracic aortic aneurysm 4.4 cm.  3 mm left upper lobe nodule.  Aortic atherosclerosis.    Past Surgical History:  Procedure Laterality Date   CYSTOSCOPY WITH URETEROSCOPY, STONE BASKETRY AND STENT PLACEMENT     "put a stent in; pulled stent out later"   INGUINAL HERNIA REPAIR Bilateral 1980s   PROSTATE BIOPSY     RIGHT/LEFT HEART CATH AND CORONARY ANGIOGRAPHY N/A 02/06/2021   Procedure: RIGHT/LEFT HEART CATH AND CORONARY ANGIOGRAPHY;  Surgeon: Troy Sine, MD;  Location: [facility];  Service: Cardiovascular;  Laterality: N/A;    Current Medications: Current Meds  Medication Sig   aspirin EC 81 MG tablet  Take 81 mg by mouth daily.   carvedilol (COREG) 25 MG tablet Take 1 tablet (25 mg total) by mouth 2 (two) times daily with a meal.   ezetimibe (ZETIA) 10 MG tablet TAKE 1 TABLET BY MOUTH DAILY   hydrALAZINE (APRESOLINE) 100 MG tablet Take 1 tablet (100 mg total) by mouth 3 (three) times daily.   isosorbide mononitrate (IMDUR) 30 MG 24 hr tablet Take 3 tablets (90 mg total) by mouth 2 (two) times daily.   rosuvastatin (CRESTOR) 40 MG tablet Take 1 tablet (40 mg total) by mouth daily at 6 PM.   spironolactone (ALDACTONE) 25 MG tablet Take 1 tablet (25 mg total) by mouth daily.   [DISCONTINUED] empagliflozin (JARDIANCE) 10 MG TABS tablet Take 1 tablet (10 mg total) by  mouth daily before breakfast.   [DISCONTINUED] isosorbide mononitrate (IMDUR) 60 MG 24 hr tablet Take 1 tablet (60 mg total) by mouth daily.   [DISCONTINUED] sacubitril-valsartan (ENTRESTO) 97-103 MG Take 1 tablet by mouth 2 (two) times daily.     Allergies:   Patient has no known allergies.   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  Tobacco Use   Smoking status: Former    Packs/day: 0.33    Years: 20.00    Total pack years: 6.60    Types: Cigarettes    Quit date: 11/11/1998    Years since quitting: 23.8   Smokeless tobacco: Never  Vaping Use   Vaping Use: Never used  Substance and Sexual Activity   Alcohol use: Yes    Comment: 11/15/2018 "mixed drink q 2-3 weeks; if that"   Drug use: Yes    Types: Marijuana    Comment: occasional use   Sexual activity: Yes  Other Topics Concern   Not on file  Social History Narrative   Not on file   Social Determinants of Health   Financial Resource Strain: Not on file  Food Insecurity: Not on file  Transportation Needs: Not on file  Physical Activity: Not on file  Stress: Not on file  Social Connections: Not on file     Family History: The patient's family history includes High blood pressure in his mother. There is no history of Heart attack, Cancer, or Diabetes.  ROS:   Please see the history of present illness.    All other systems reviewed and are negative.  EKGs/Labs/Other Studies Reviewed:    The following studies were reviewed today:  Chest Aorta CTA 05/24/21 IMPRESSION: 4.4 cm ascending thoracic aortic aneurysm. 3 mm left upper lobe pulmonary nodule  Aortic Atherosclerosis (ICD10-I70.0) and Emphysema  (ICD10-J43.9). Aortic aneurysm NOS (ICD10-I71.9).   Echocardiogram 05/24/21 EF 40-45, global HK, normal RVSF, mild LAE, mild MR, moderate AI, mild AV sclerosis without stenosis, aortic root 42 mm   RIGHT/LEFT HEART CATH AND CORONARY ANGIOGRAPHY 02/06/2021  Mid LAD lesion is 15% stenosed. RECOMMENDATION: Findings are consistent with a nonischemic cardiomyopathy.  Right heart pressures are extremely low.  Will gently hydrate post procedure in this patient with EF of 25 to 30%.   Echocardiogram 02/04/21 Diff HK, worse in inf base, EF 25-30, no RWMA, mild LVH, normal RVSF, mild LAE,  trivial MR, mod AI, mild to mod AV sclerosis, no AS, mod dilation of ascending aorta (44 mm)   NM Myocar Multi W/Spect W/Wall Motion / EF 11/13/2018 1. No evidence of pharmacologic induced myocardial ischemia. Large inferior wall myocardial infarct. 2. Inferior wall akinesis of left ventricle, with moderate to severe dilatation. 3. Left ventricular ejection fraction 34% 4. Non invasive risk stratification*: High   Echocardiogram 11/12/2018 EF 40-45, mild to mod AI  EKG:  EKG is personally reviewed. 08/25/22: Sinus rhythm. LVH with strain, inferior q-waves, HR 68bpm   Recent Labs: No results found for requested labs within last 365 days.  Recent Lipid Panel    Component Value Date/Time   CHOL 123 03/27/2021 0905   TRIG 55 03/27/2021 0905   HDL 40 03/27/2021 0905   CHOLHDL 3.1 03/27/2021 0905   LDLCALC 71 03/27/2021 0905     Risk Assessment/Calculations:      HYPERTENSION CONTROL Vitals:   08/25/22 1131 08/25/22 1148  BP: (!) 200/120 (!) 170/105    The patient's blood pressure is elevated above target today.  In order to address the patient's elevated BP: A new medication was prescribed today.; A current anti-hypertensive medication was adjusted today.            Physical Exam:    VS:  BP (!) 170/105 (BP Location: Left Arm, Patient Position: Sitting, Cuff Size: Normal)   Pulse 68   Ht  '6\' 4"'$  (1.93 m)   Wt 187 lb 6.4 oz (85 kg)   SpO2 100%   BMI 22.81 kg/m     Wt Readings from Last 3 Encounters:  08/25/22 187 lb 6.4 oz (85 kg)  08/13/21 186 lb 9.6 oz (84.6 kg)  04/30/21 181 lb 3.2 oz (82.2 kg)     GEN:  Well nourished, well developed in no acute distress HEENT: Normal NECK: No JVD; No carotid bruits CARDIAC: RRR, 3/6 systolic and 1/6 diastolic murmur, rubs, gallops RESPIRATORY:  Clear to auscultation without rales, wheezing or rhonchi  ABDOMEN: Soft, non-tender, non-distended MUSCULOSKELETAL:  No edema; No deformity  SKIN: Warm and dry NEUROLOGIC:  Alert and oriented x 3 PSYCHIATRIC:  Normal affect   ASSESSMENT:    1. Chronic combined systolic and diastolic CHF (congestive heart failure) ([place])   2. Moderate aortic regurgitation   3. Medication management   4. Essential hypertension   5. Stage 3a chronic kidney disease ([place])   6. Mixed hyperlipidemia   7. Nonrheumatic aortic valve insufficiency   8. Aneurysm of ascending aorta without rupture ([place])   9. Coronary artery disease involving native coronary artery of native heart without angina pectoris     PLAN:    In order of problems listed above:  #NICM: #Chronic Systolic HF: Patient with history of NICM with nonobstructive CAD on cath. EF as low as 20% but has improved to 40-45%. Currently with NYHA II symptoms. Euvolemic on exam, but with severe HTN in the setting of not being on multiple medications. Discussed with Pharm D and likely he just needs written refills.  -Continue coreg '25mg'$  BID -Continue jardiance '10mg'$  daily (ran out of med) -Continue entresto 97/'103mg'$  BID (ran out of med) -Continue spiro '25mg'$  daily -Continue hydralazine '100mg'$  TID -Increase imdur to '90mg'$  BID -BMET next week  #HTN: Very elevated today but has run out of entresto. Refilled all meds and increase imdur as below. Will have him follow-up with Pharm D as well. -Continue entresto 97/'103mg'$  BID -Continue spiro '25mg'$   daily -Continue hydralazine '100mg'$  TID -  Increase imdur to '90mg'$  BID -Continue coreg '25mg'$  BID -Follow-up with Pharm D -BMET next week  #Ascending aortic aneurysm: Measures 4.4cm on most recent CT 05/2021. Need repeat for monitorig. -Serial monitoring with yearly CT scans  #Moderate AI: -Continue serial monitoring with yearly echoes  #Stage III A CKD: -Trend  #Nonobstructive CAD: -Continue crestor '20mg'$  daily -Continue zetia '10mg'$  daily -Continue ASA '81mg'$  daily -Lipids when fasting  #HLD: -Continue crestor '20mg'$  daily -Continue zetia '10mg'$  daily -Lipids when fasting          Follow up: 3 months with APP.  Medication Adjustments/Labs and Tests Ordered: Current medicines are reviewed at length with the patient today.  Concerns regarding medicines are outlined above.  Orders Placed This Encounter  Procedures   Basic metabolic panel   Lipid Profile   EKG 12-Lead   ECHOCARDIOGRAM COMPLETE   Meds ordered this encounter  Medications   sacubitril-valsartan (ENTRESTO) 97-103 MG    Sig: Take 1 tablet by mouth 2 (two) times daily.    Dispense:  180 tablet    Refill:  3   empagliflozin (JARDIANCE) 10 MG TABS tablet    Sig: Take 1 tablet (10 mg total) by mouth daily before breakfast.    Dispense:  90 tablet    Refill:  3   isosorbide mononitrate (IMDUR) 30 MG 24 hr tablet    Sig: Take 3 tablets (90 mg total) by mouth 2 (two) times daily.    Dispense:  180 tablet    Refill:  4    Dose increase    Patient Instructions  Medication Instructions:   RESTART BACK TAKING YOUR JARDIANCE  RESTART BACK TAKING YOUR ENTRESTO  INCREASE YOUR ISOSORBIDE MONONITRATE (IMDUR) TO 90 MG BY MOUTH TWICE DAILY   *If you need a refill on your cardiac medications before your next appointment, please call your pharmacy*   Lab Work:  IN ONE WEEK HERE IN THE OFFICE--CHECK BMET AND LIPIDS AT THAT VISIT--PLEASE COME FASTING TO THIS LAB APPOINTMENT  If you have labs (blood work) drawn today  and your tests are completely normal, you will receive your results only by: [place] (if you have MyChart) OR A paper copy in the mail If you have any lab test that is abnormal or we need to change your treatment, we will call you to review the results.   Testing/Procedures:  Your physician has requested that you have an echocardiogram. Echocardiography is a painless test that uses sound waves to create images of your heart. It provides your doctor with information about the size and shape of your heart and how well your heart's chambers and valves are working. This procedure takes approximately one hour. There are no restrictions for this procedure. Please do NOT wear cologne, perfume, aftershave, or lotions (deodorant is allowed). Please arrive 15 minutes prior to your appointment time.    Follow-Up:  1.)  WITH PHARMACIST IN BLOOD PRESSURE CLINIC AT VERY NEXT AVAILABLE APPOINTMENT--PATIENT IS ALREADY ESTABLISHED WITH THEM, JUST NEEDS AN APPOINTMENT   2.)  3 MONTHS WITH AN EXTENDER IN THE OFFICE   Other Instructions  --PLEASE BUY YOURSELF A BP CUFF AND START LOGGING YOUR READINGS--BRING THE READINGS IN WITH YOU TO YOUR BP CLINIC APPOINTMENT TO SEE THE PHARMACIST AND INTO YOUR 3 MONTH APPOINTMENT WITH THE EXTENDER         I,Breanna Adamick,acting as a scribe for Freada Bergeron, MD.,have documented all relevant documentation on the behalf of Freada Bergeron, MD,as directed by  Nira Conn  Renae Fickle, MD while in the presence of Freada Bergeron, MD.  I, Freada Bergeron, MD, have reviewed all documentation for this visit. The documentation on 08/25/22 for the exam, diagnosis, procedures, and orders are all accurate and complete.    Signed, Freada Bergeron, MD  08/25/2022 12:48 PM    [place]

## 2022-08-25 NOTE — Patient Instructions (Signed)
Medication Instructions:   RESTART BACK TAKING YOUR JARDIANCE  RESTART BACK TAKING YOUR ENTRESTO  INCREASE YOUR ISOSORBIDE MONONITRATE (IMDUR) TO 90 MG BY MOUTH TWICE DAILY   *If you need a refill on your cardiac medications before your next appointment, please call your pharmacy*   Lab Work:  IN ONE WEEK HERE IN THE OFFICE--CHECK BMET AND LIPIDS AT THAT VISIT--PLEASE COME FASTING TO THIS LAB APPOINTMENT  If you have labs (blood work) drawn today and your tests are completely normal, you will receive your results only by: [place] (if you have MyChart) OR A paper copy in the mail If you have any lab test that is abnormal or we need to change your treatment, we will call you to review the results.   Testing/Procedures:  Your physician has requested that you have an echocardiogram. Echocardiography is a painless test that uses sound waves to create images of your heart. It provides your doctor with information about the size and shape of your heart and how well your heart's chambers and valves are working. This procedure takes approximately one hour. There are no restrictions for this procedure. Please do NOT wear cologne, perfume, aftershave, or lotions (deodorant is allowed). Please arrive 15 minutes prior to your appointment time.    Follow-Up:  1.)  WITH PHARMACIST IN BLOOD PRESSURE CLINIC AT VERY NEXT AVAILABLE APPOINTMENT--PATIENT IS ALREADY ESTABLISHED WITH THEM, JUST NEEDS AN APPOINTMENT   2.)  3 MONTHS WITH AN EXTENDER IN THE OFFICE   Other Instructions  --PLEASE BUY YOURSELF A BP CUFF AND START LOGGING YOUR READINGS--BRING THE READINGS IN WITH YOU TO YOUR BP CLINIC APPOINTMENT TO SEE THE PHARMACIST AND INTO YOUR 3 MONTH APPOINTMENT WITH THE EXTENDER

## 2022-08-29 ENCOUNTER — Other Ambulatory Visit: Payer: Self-pay

## 2022-08-29 MED ORDER — EZETIMIBE 10 MG PO TABS: 10.0000 mg | ORAL_TABLET | Freq: Every day | ORAL | 3 refills | Status: DC

## 2022-09-04 ENCOUNTER — Ambulatory Visit: Payer: BC Managed Care – PPO | Attending: Cardiology

## 2022-09-04 DIAGNOSIS — E782 Mixed hyperlipidemia: Secondary | ICD-10-CM | POA: Diagnosis not present

## 2022-09-04 DIAGNOSIS — I351 Nonrheumatic aortic (valve) insufficiency: Secondary | ICD-10-CM | POA: Diagnosis not present

## 2022-09-04 DIAGNOSIS — N1831 Chronic kidney disease, stage 3a: Secondary | ICD-10-CM

## 2022-09-04 DIAGNOSIS — I10 Essential (primary) hypertension: Secondary | ICD-10-CM

## 2022-09-04 DIAGNOSIS — Z79899 Other long term (current) drug therapy: Secondary | ICD-10-CM | POA: Diagnosis not present

## 2022-09-04 DIAGNOSIS — I5042 Chronic combined systolic (congestive) and diastolic (congestive) heart failure: Secondary | ICD-10-CM | POA: Diagnosis not present

## 2022-09-04 LAB — BASIC METABOLIC PANEL
BUN/Creatinine Ratio: 8 — ABNORMAL LOW (ref 9–20)
BUN: 9 mg/dL (ref 6–24)
CO2: 23 mmol/L (ref 20–29)
Calcium: 9.3 mg/dL (ref 8.7–10.2)
Chloride: 110 mmol/L — ABNORMAL HIGH (ref 96–106)
Creatinine, Ser: 1.09 mg/dL (ref 0.76–1.27)
Glucose: 109 mg/dL — ABNORMAL HIGH (ref 70–99)
Potassium: 3.9 mmol/L (ref 3.5–5.2)
Sodium: 142 mmol/L (ref 134–144)
eGFR: 78 mL/min/{1.73_m2} (ref >59)

## 2022-09-04 LAB — LIPID PANEL
Chol/HDL Ratio: 1.9 ratio (ref 0.0–5.0)
Cholesterol, Total: 88 mg/dL — ABNORMAL LOW (ref 100–199)
HDL: 46 mg/dL (ref >39)
LDL Chol Calc (NIH): 32 mg/dL (ref 0–99)
Triglycerides: 28 mg/dL (ref 0–149)
VLDL Cholesterol Cal: 10 mg/dL (ref 5–40)

## 2022-09-09 ENCOUNTER — Other Ambulatory Visit (HOSPITAL_COMMUNITY): Payer: BC Managed Care – PPO

## 2022-09-23 ENCOUNTER — Ambulatory Visit (HOSPITAL_COMMUNITY): Payer: BC Managed Care – PPO | Attending: Cardiology

## 2022-09-23 ENCOUNTER — Telehealth: Payer: Self-pay | Admitting: *Deleted

## 2022-09-23 DIAGNOSIS — I340 Nonrheumatic mitral (valve) insufficiency: Secondary | ICD-10-CM

## 2022-09-23 DIAGNOSIS — I351 Nonrheumatic aortic (valve) insufficiency: Secondary | ICD-10-CM | POA: Insufficient documentation

## 2022-09-23 DIAGNOSIS — I77819 Aortic ectasia, unspecified site: Secondary | ICD-10-CM

## 2022-09-23 LAB — ECHOCARDIOGRAM COMPLETE
Area-P 1/2: 3.42 cm2
P 1/2 time: 604 msec
S' Lateral: 5.1 cm

## 2022-09-23 NOTE — Telephone Encounter (Signed)
-----   Message from Freada Bergeron, MD sent at 09/23/2022  1:06 PM EST ----- His echo shows that his pumping function decreased to 30-35% likely because he had been out of his medications and his blood pressure has been elevated. There is mild leakiness of his mitral valve and mild dilation of his aorta which we will continue to monitor with yearly echoes.  Is he taking his medications and is his blood pressure better controlled? I see that his SBP was 170 on his echo so if he doesn't have his meds and/or BP has been high, we need to get him in for medication titration.

## 2022-09-23 NOTE — Telephone Encounter (Signed)
The patient has been notified of the result and verbalized understanding.  All questions (if any) were answered.  Pt aware he will need another echo in one year for survelliance mild MR and dilated aorta.  Pt aware I will go ahead and place the order for repeat echo in one year in the system and send a message to our Echo Scheduler to call him back and arrange this appt for this time.    Asked the pt if he was taking all his BP medications and monitoring his numbers at home.  Pt states he has been taking all his meds except for entresto and jardiance, due to cost.   Pt states he has been taking entresto and jardiance before and Pharmacist had them approved, but now his insurance is denying both stating he will have to pay full cost for each medication, which he states is $1000 per med per month.     Pt states he will be receiving his BP cuff in the mail tomorrow and will start monitoring his numbers and recording this daily.  He states he will bring his cuff and recordings to his upcoming PharmD appt with BP clinic on 10/03/22.  Pt is scheduled to see PharmD in BP clinic on 12/1 and confirmed with me that he will be attending this visit.   Pt would like for our PharmD team and Prior Auth Nurse to give him a callback tomorrow, to further assist him in getting assistance for both his entresto and jardiance.   He is aware that I will route this communication to both parties, so that they can reach out to him tomorrow and assist with cost issues for both meds.  Pt states he is easily reached if they call him in the morning time.   Pt aware that I will also make Dr. Johney Frame aware of this message as well.  Pt verbalized understanding and agrees with this plan.

## 2022-09-24 NOTE — Telephone Encounter (Signed)
He has Pharmacist, community and should be using copay cards - Jeani Hawking are you able to check in on this to make sure he has them/they're being processed at the pharmacy?

## 2022-09-24 NOTE — Telephone Encounter (Signed)
**Note De-Identified Jakeel Starliper Obfuscation** I called [facility] and was advised by the pharmacist that the pts co-pay cards from 2022 are no longer working and that the pt needs new co-pay cards for both [place].  I called the pt and advised him of this. He is aware that we are leaving him a [place] co-pay card and and Jardiance co-pay card in the front office at Dr [employer] office for him to pick up and to take with him to [employer] when picking up his [name] and [place] refills.  He verbalized understanding and thanked me for our assistance.

## 2022-09-30 ENCOUNTER — Other Ambulatory Visit: Payer: Self-pay

## 2022-09-30 MED ORDER — HYDRALAZINE HCL 100 MG PO TABS: 100.0000 mg | ORAL_TABLET | Freq: Three times a day (TID) | ORAL | 3 refills | Status: DC

## 2022-10-01 NOTE — Telephone Encounter (Signed)
Called [facility], pt has high deductible so copay card for Jardiance only takes off $175/month, therefore his copay is still >$500/90 days. This is unaffordable for patient. Entresto copay card did cover all but $10. He thinks his plan is the same next year. I advised that I will let Dr. Johney Frame know that he cannot afford Jardiance. Really no replacement for it.

## 2022-10-01 NOTE — Addendum Note (Signed)
Addended by: Marcelle Overlie D on: 10/01/2022 11:44 AM   Modules accepted: Orders

## 2022-10-03 ENCOUNTER — Ambulatory Visit: Payer: BC Managed Care – PPO | Attending: Cardiovascular Disease | Admitting: Student

## 2022-10-03 DIAGNOSIS — I10 Essential (primary) hypertension: Secondary | ICD-10-CM | POA: Diagnosis not present

## 2022-10-03 NOTE — Progress Notes (Signed)
Patient ID: Darius Rivers                 DOB: 1960/11/20                      MRN: [id number]      HPI: Darius Rivers is a 61 y.o. male referred by Dr. Johney Frame to HTN clinic. PMH is significant for nonobstructive CAD, NICM, HFrEF with LVEF 40-45%, NSVT, ascending aortic aneurysm, HTN, HLD, CKD and prostate cancer. Patient saw Richardson Dopp, PA on 08/13/2021 his BP was high so hydralazine was increased to 75 mg three times daily and moth later Marcelle Overlie, PharmD had titrate the hydralazine dose to 100 mg three times daily for optimal BP. Patient saw Dr. Johney Frame on 08/25/2022 in office BP was high so Imdur was increased to 90 mg twice daily  Patient has no acute concern. He feels great denies any palpitations, chest pain, shortness of breath, or peripheral edema. No lightheadedness, headaches, syncope, orthopnea, or PND. He was off of Jardiance and Entresto due to cost issue but restarted Entresto as co-pay card is helping with higher co-pays. But not taking Jardiance anymore. Reports taking all his medications regularly and tolerating them well. Patient brought all his medications and spironolactone was not there so he will check at home and if he does not have it he will get the refill from the pharmacy.He eats home cooked low salt food. 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 He is very active at work and do regular exercise. He got new home cuff which is validated today and it turn out to be accurate.   SBP/DBP  HR  1st  on home monitor 127/84 64  1st manual  124/80 62  2nd on home monitor 130/85 61  2nd manual  128/80    Current HTN meds: hydralazine 100 mg three times daily, coreg 25 mg twice daily, isosorbide 90 mg twice daily  daily, spironolactone 25 mg daily, Entresto 97/103 mg twice daily   BP goal: <130/80  Family History: hypertension - mother  Social History:  Alcohol:  1- 2 drink every other week  Recreational drug BZJ:IRCV  Smoking: none quit 26 years ago    Diet: less salt  Breakfast: Fruits, grits , eggs, cereal  Lunch- sandwiches or couple banana (avoids deli meat)  Dinner: rice green beans corn broccoli -cheese collard green (some time does not eat dinner) Snacks: oatmeal cake, moon pie (avoids salty snacks) Drink: mainly water some time unsweetened tea or soda   Exercise: Every day 1 mile walking and some resistance exercise   Home BP readings: does not check at home got new monitor and its validated today    Wt Readings from Last 3 Encounters:  08/25/22 187 lb 6.4 oz (85 kg)  08/13/21 186 lb 9.6 oz (84.6 kg)  04/30/21 181 lb 3.2 oz (82.2 kg)   BP Readings from Last 3 Encounters:  10/03/22 128/80  08/25/22 (!) 170/105  09/19/21 138/90   Pulse Readings from Last 3 Encounters:  10/03/22 62  08/25/22 68  08/13/21 64    Renal function: CrCl cannot be calculated (Patient's most recent lab result is older than the maximum 21 days allowed.).  Past Medical History:  Diagnosis Date   Chronic kidney disease stage II    Coronary artery disease    NSTEMI in 11/2018 - Lg scar on Myoview; Rx medically due to CKD // Cath 4/22: mLAD 15 (no sig CAD)  HFrEF (heart failure with reduced EF)    NICM // Echocardiogram 11/2018: EF 40-45 // Echocardiogram 4/22:  EF 25-30 // Echo 7/22: EF 40-45, global HK normal RVSF, mild LAE, mild MR, moderate AI, mild AV sclerosis without stenosis, mild dilation of aortic root (42 mm)   Hyperlipidemia    Hypertension    Lung nodule    CT 7/22: Left upper lobe 3 mm> repeat chest CT in 1 year   Nephrolithiasis    NSVT (nonsustained ventricular tachycardia)    Prostate cancer ([place])    watchful waiting, due to go back to urology   Thoracic ascending aortic aneurysm    Echo 4/22: 44 mm // Chest CTA 7/22: Ascending thoracic aortic aneurysm 4.4 cm.  3 mm left upper lobe nodule.  Aortic atherosclerosis.    Current Outpatient Medications on File Prior to Visit  Medication Sig Dispense Refill   aspirin EC 81  MG tablet Take 81 mg by mouth daily.     carvedilol (COREG) 25 MG tablet Take 1 tablet (25 mg total) by mouth 2 (two) times daily with a meal. 180 tablet 0   ezetimibe (ZETIA) 10 MG tablet Take 1 tablet (10 mg total) by mouth daily. 90 tablet 3   hydrALAZINE (APRESOLINE) 100 MG tablet Take 1 tablet (100 mg total) by mouth 3 (three) times daily. 270 tablet 3   isosorbide mononitrate (IMDUR) 30 MG 24 hr tablet Take 3 tablets (90 mg total) by mouth 2 (two) times daily. 180 tablet 4   rosuvastatin (CRESTOR) 40 MG tablet Take 1 tablet (40 mg total) by mouth daily at 6 PM. 90 tablet 0   sacubitril-valsartan (ENTRESTO) 97-103 MG Take 1 tablet by mouth 2 (two) times daily. 180 tablet 3   spironolactone (ALDACTONE) 25 MG tablet Take 1 tablet (25 mg total) by mouth daily. 30 tablet 3   No current facility-administered medications on file prior to visit.    No Known Allergies  Blood pressure 128/80, pulse 62, SpO2 98 %.   Essential hypertension Assessment: BP is un/controlled in office BP 124/80 with heart rate 62 goal (<130/80) Bought new home cuff - validated today - it is accurate  Tolerates current medication well without any side effects  Denies SOB, palpitation, chest pain, headaches,or swelling  Back on Entresto as cost issue has been resolved with co-pay card  Reiderated the importance of regular exercise and low salt diet   Plan:  Continue taking  current medications  hydralazine 100 mg three times daily Coreg 25 mg twice daily isosorbide 90 mg twice daily spironolactone 25 mg daily Entresto 97/103 mg twice daily  Patient to keep record of BP readings with heart rate and report [place] if it persistently stays above the goal (<130/80) 3 months follow up has been scheduled with Richardson Dopp, PA    Thank you  Cammy Copa, Pharm.D [facility] of [facility] [address] [place], [place] [address]  Phone: [phone]; Fax: [phone]

## 2022-10-03 NOTE — Patient Instructions (Addendum)
No changes made  by your pharmacist Cammy Copa, PharmD at today's visit:    Instructions/Changes  (what do you need to do) Your Notes  (what you did and when you did it)  Continue taking  hydralazine 100 mg three times daily, coreg 25 mg twice daily, isosorbide 90 mg twice daily  daily, spironolactone 25 mg daily, Entresto 97/103 mg twice daily       HOW TO TAKE YOUR BLOOD PRESSURE AT HOME  Rest 5 minutes before taking your blood pressure.  Don't smoke or drink caffeinated beverages for at least 30 minutes before. Take your blood pressure before (not after) you eat. Sit comfortably with your back supported and both feet on the floor (don't cross your legs). Elevate your arm to heart level on a table or a desk. Use the proper sized cuff. It should fit smoothly and snugly around your bare upper arm. There should be enough room to slip a fingertip under the cuff. The bottom edge of the cuff should be 1 inch above the crease of the elbow. Ideally, take 3 measurements at one sitting and record the average.  Important lifestyle changes to control high blood pressure  Intervention  Effect on the BP  Lose extra pounds and watch your waistline Weight loss is one of the most effective lifestyle changes for controlling blood pressure. If you're overweight or obese, losing even a small amount of weight can help reduce blood pressure. Blood pressure might go down by about 1 millimeter of mercury (mm Hg) with each kilogram (about 2.2 pounds) of weight lost.  Exercise regularly As a general goal, aim for at least 30 minutes of moderate physical activity every day. Regular physical activity can lower high blood pressure by about 5 to 8 mm Hg.  Eat a healthy diet Eating a diet rich in whole grains, fruits, vegetables, and low-fat dairy products and low in saturated fat and cholesterol. A healthy diet can lower high blood pressure by up to 11 mm Hg.  Reduce salt (sodium) in your diet Even a small  reduction of sodium in the diet can improve heart health and reduce high blood pressure by about 5 to 6 mm Hg.  Limit alcohol One drink equals 12 ounces of beer, 5 ounces of wine, or 1.5 ounces of 80-proof liquor.  Limiting alcohol to less than one drink a day for women or two drinks a day for men can help lower blood pressure by about 4 mm Hg.   If you have any questions or concerns please use My Chart to send questions or call the office at [phone]

## 2022-10-03 NOTE — Assessment & Plan Note (Addendum)
Assessment: BP is un/controlled in office BP 124/80 with heart rate 62 goal (<130/80) Bought new home cuff - validated today - it is accurate  Tolerates current medication well without any side effects  Denies SOB, palpitation, chest pain, headaches,or swelling  Back on Entresto as cost issue has been resolved with co-pay card  Reiderated the importance of regular exercise and low salt diet   Plan:  Continue taking  current medications  hydralazine 100 mg three times daily Coreg 25 mg twice daily isosorbide 90 mg twice daily spironolactone 25 mg daily Entresto 97/103 mg twice daily  Patient to keep record of BP readings with heart rate and report [place] if it persistently stays above the goal (<130/80) 3 months follow up has been scheduled with Richardson Dopp, PA

## 2022-11-07 ENCOUNTER — Other Ambulatory Visit: Payer: Self-pay

## 2022-11-07 ENCOUNTER — Other Ambulatory Visit: Payer: Self-pay | Admitting: *Deleted

## 2022-11-07 MED ORDER — ROSUVASTATIN CALCIUM 40 MG PO TABS: 40.0000 mg | ORAL_TABLET | Freq: Every day | ORAL | 0 refills | Status: DC

## 2022-11-07 MED ORDER — CARVEDILOL 25 MG PO TABS: 25.0000 mg | ORAL_TABLET | Freq: Two times a day (BID) | ORAL | 3 refills | Status: DC

## 2022-11-24 NOTE — Progress Notes (Addendum)
Cardiology Office Note:    Date:  11/25/2022   ID:  Darius Rivers, DOB 02-03-1961, MRN [id number]  PCP:  Fortino Sic, [place] Providers Cardiologist:  Freada Bergeron, MD Cardiology APP:  Sharmon Revere     Referring MD: Fortino Sic, *   Patient Profile: Coronary artery disease NSTEMI in 11/2018 >> Myoview 11/2018: Lg inf scar, EF 34 >> Med Rx 2/2 CKD Cath 4/22: non-obstructive CAD [mLAD 15] (HFrEF) heart failure with reduced ejection fraction  Non-ischemic cardiomyopathy TTE 11/2018: EF 40-45 TTE 4/22:  EF 25-30 >> TTE 7/22: EF 40-45 TTE 09/23/22: EF 30-35, global HK severe LVE, Gr 1 DD, NL RVSF, mild MR< mod AI, AV sclerosis, no AS, mild dilation of Ao root (43 mm), mild dilation of Asc Aorta (44 mm), RAP 8 Moderate aortic insufficiency  NSVT Chronic kidney disease Ascending thoracic aortic aneurysm TTE 4/22; TTE 11/23:  44 mm CT 7/22: 44 mm >> rpt 1 year  Lung nodule (3 mm LUL by CT in 7/22)  +Tobacco smoking  Hypertension Hyperlipidemia Prostate CA  Aortic atherosclerosis       History of Present Illness:   Darius Rivers is a 62 y.o. male with the above problem list.  He was last seen by Dr. Johney Frame 08/25/22. He was off Entresto due to cost and his BP was uncontrolled. A f/u echocardiogram showed worsening EF at 30-35. Our PharmD was able to get him a copay card for the [facility]. Unfortunately, his insurance does not cover Jardiance well (very high deductible) and he is unable to get this medication. He returns for f/u on his CHF. He is here alone. He is doing well w/o chest pain, shortness of breath, syncope, orthopnea, leg edema. He still smokes 1 cig per day. I have recommended that he quit.         Reviewed and updated this encounter:   Tobacco  Allergies  Meds  Problems  Med Hx  Surg Hx  Fam Hx     ROS  Labs/Other Test Reviewed:   Recent Labs: 09/04/2022: BUN 9; Creatinine, Ser 1.09; Potassium 3.9; Sodium  142   Recent Lipid Panel Recent Labs    09/04/22 0821  CHOL 88*  TRIG 28  HDL 46  LDLCALC 32     Risk Assessment/Calculations/Metrics:             Physical Exam:   VS:  BP 130/86   Pulse 64   Ht 6' 4.5" (1.943 m)   Wt 189 lb 9.6 oz (86 kg)   SpO2 95%   BMI 22.78 kg/m    Wt Readings from Last 3 Encounters:  11/25/22 189 lb 9.6 oz (86 kg)  08/25/22 187 lb 6.4 oz (85 kg)  08/13/21 186 lb 9.6 oz (84.6 kg)    Physical Exam       ASSESSMENT & PLAN:   HFrEF (heart failure with reduced ejection fraction) (HCC) Non-ischemic cardiomyopathy. NYHA II. Volume status stable.  EF had improved to 40-45 but recent TTE showed EF 30-35. This was in the setting of uncontrolled BP. He was off Entresto. He is back on Entresto and his BP is much better controlled. He is unable to get SGLT2i due to high deductible.  Continue Carvedilol 25 mg twice daily, hydralazine 100 mg three times a day, Isosorbide MN 90 mg twice daily, Entresto 97/103 mg twice daily, Spironolactone 25 mg once daily.  F/u 6 mos Repeat TTE planned for Nov 2024.  Essential hypertension Fair control. He just got a new BP machine I have asked him to send me BP readings in 1 week. Continue Carvedilol 25 mg twice daily, hydralazine 100 mg three times a day, Isosorbide MN 90 mg twice daily, Entrestor 97/103 mg twice daily, Spironolactone 25 mg once daily.   Thoracic ascending aortic aneurysm 44 mm on CT in 7/22. 44 mm on TTE in 09/2022. Repeat TTE planned in 09/2023.  Lung nodule 3 mm LUL on CT in 7/22. He smokes cigarettes. Will obtain f/u non-contrast chest CT.   Moderate aortic insufficiency Repeat TTE planned in Nov 2024.   CKD (chronic kidney disease), stage III ([place]) Creatinine normal in Nov 2023.  Hyperlipidemia LDL optimal on most recent lab work.  Continue current Rx with Ezetimibe 10 mg once daily, Rosuvastatin 40 mg once daily.            Dispo:  Return in about 6 months (around 05/26/2023) for Routine  Follow Up, w/ Dr. Johney Frame, or Richardson Dopp, PA-C.   Signed, Richardson Dopp, PA-C  11/25/2022 10:52 AM    [facility], [place], [place]  [address] Phone: [phone]; Fax: [phone]

## 2022-11-25 ENCOUNTER — Ambulatory Visit: Payer: 59 | Attending: Physician Assistant | Admitting: Physician Assistant

## 2022-11-25 ENCOUNTER — Encounter: Payer: Self-pay | Admitting: Physician Assistant

## 2022-11-25 VITALS — BP 130/86 | HR 64 | Ht 76.5 in | Wt 189.6 lb

## 2022-11-25 DIAGNOSIS — I10 Essential (primary) hypertension: Secondary | ICD-10-CM

## 2022-11-25 DIAGNOSIS — R911 Solitary pulmonary nodule: Secondary | ICD-10-CM

## 2022-11-25 DIAGNOSIS — I5020 Unspecified systolic (congestive) heart failure: Secondary | ICD-10-CM | POA: Diagnosis not present

## 2022-11-25 DIAGNOSIS — I351 Nonrheumatic aortic (valve) insufficiency: Secondary | ICD-10-CM

## 2022-11-25 DIAGNOSIS — E782 Mixed hyperlipidemia: Secondary | ICD-10-CM

## 2022-11-25 DIAGNOSIS — I7121 Aneurysm of the ascending aorta, without rupture: Secondary | ICD-10-CM | POA: Diagnosis not present

## 2022-11-25 DIAGNOSIS — N1831 Chronic kidney disease, stage 3a: Secondary | ICD-10-CM

## 2022-11-25 NOTE — Assessment & Plan Note (Signed)
44 mm on CT in 7/22. 44 mm on TTE in 09/2022. Repeat TTE planned in 09/2023.

## 2022-11-25 NOTE — Assessment & Plan Note (Signed)
3 mm LUL on CT in 7/22. He smokes cigarettes. Will obtain f/u non-contrast chest CT.

## 2022-11-25 NOTE — Assessment & Plan Note (Signed)
Fair control. He just got a new BP machine I have asked him to send me BP readings in 1 week. Continue Carvedilol 25 mg twice daily, hydralazine 100 mg three times a day, Isosorbide MN 90 mg twice daily, Entrestor 97/103 mg twice daily, Spironolactone 25 mg once daily.

## 2022-11-25 NOTE — Assessment & Plan Note (Signed)
LDL optimal on most recent lab work.  Continue current Rx with Ezetimibe 10 mg once daily, Rosuvastatin 40 mg once daily.

## 2022-11-25 NOTE — Assessment & Plan Note (Signed)
Repeat TTE planned in Nov 2024.

## 2022-11-25 NOTE — Assessment & Plan Note (Signed)
Creatinine normal in Nov 2023.

## 2022-11-25 NOTE — Assessment & Plan Note (Signed)
Non-ischemic cardiomyopathy. NYHA II. Volume status stable.  EF had improved to 40-45 but recent TTE showed EF 30-35. This was in the setting of uncontrolled BP. He was off Entresto. He is back on Entresto and his BP is much better controlled. He is unable to get SGLT2i due to high deductible.  Continue Carvedilol 25 mg twice daily, hydralazine 100 mg three times a day, Isosorbide MN 90 mg twice daily, Entresto 97/103 mg twice daily, Spironolactone 25 mg once daily.  F/u 6 mos Repeat TTE planned for Nov 2024.

## 2022-11-25 NOTE — Patient Instructions (Signed)
Medication Instructions:  Your physician recommends that you continue on your current medications as directed. Please refer to the Current Medication list given to you today.  *If you need a refill on your cardiac medications before your next appointment, please call your pharmacy*   Lab Work: None ordered  If you have labs (blood work) drawn today and your tests are completely normal, you will receive your results only by: [place] (if you have MyChart) OR A paper copy in the mail If you have any lab test that is abnormal or we need to change your treatment, we will call you to review the results.   Testing/Procedures: Your physician recommends that you have a CT Chest Without Contrast to follow up on the lung nodule from the past.     Follow-Up: At [facility], you and your health needs are our priority.  As part of our continuing mission to provide you with exceptional heart care, we have created designated Provider Care Teams.  These Care Teams include your primary Cardiologist (physician) and Advanced Practice Providers (APPs -  Physician Assistants and Nurse Practitioners) who all work together to provide you with the care you need, when you need it.  We recommend signing up for the patient portal called "MyChart".  Sign up information is provided on this After Visit Summary.  MyChart is used to connect with patients for Virtual Visits (Telemedicine).  Patients are able to view lab/test results, encounter notes, upcoming appointments, etc.  Non-urgent messages can be sent to your provider as well.   To learn more about what you can do with MyChart, go to [email or web address].    Your next appointment:   6 month(s)  Provider:   Freada Bergeron, MD  or Richardson Dopp, PA-C         Other Instructions

## 2022-12-08 ENCOUNTER — Other Ambulatory Visit: Payer: Self-pay

## 2022-12-08 MED ORDER — ROSUVASTATIN CALCIUM 40 MG PO TABS: 40.0000 mg | ORAL_TABLET | Freq: Every day | ORAL | 3 refills | Status: AC

## 2022-12-11 ENCOUNTER — Ambulatory Visit (HOSPITAL_BASED_OUTPATIENT_CLINIC_OR_DEPARTMENT_OTHER)
Admission: RE | Admit: 2022-12-11 | Discharge: 2022-12-11 | Disposition: A | Discharge status: Home / Self Care | Payer: 59 | Source: Ambulatory Visit | Attending: Physician Assistant | Admitting: Physician Assistant

## 2022-12-11 DIAGNOSIS — R911 Solitary pulmonary nodule: Secondary | ICD-10-CM | POA: Insufficient documentation

## 2022-12-15 ENCOUNTER — Telehealth: Payer: Self-pay

## 2022-12-15 DIAGNOSIS — I7121 Aneurysm of the ascending aorta, without rupture: Secondary | ICD-10-CM

## 2022-12-15 DIAGNOSIS — I5020 Unspecified systolic (congestive) heart failure: Secondary | ICD-10-CM

## 2022-12-15 NOTE — Telephone Encounter (Signed)
The patient has been notified of the result and verbalized understanding.  All questions (if any) were answered. Bernestine Amass, RN 12/15/2022 9:38 AM   CT has been ordered.

## 2022-12-15 NOTE — Telephone Encounter (Signed)
-----   Message from Liliane Shi, [place] sent at 12/12/2022  1:07 PM EST ----- CT shows small nodules that are unchanged and do not need further f/u. Ascending thoracic aortic aneurysm is slightly enlarged at 4.5 cm. PLAN:  -Arrange gated chest/aorta CTA in 6 mos to eval thoracic aortic aneurysm -Continue current medications/treatment plan and follow up as scheduled.  Richardson Dopp, PA-C    12/12/2022 1:04 PM

## 2022-12-26 ENCOUNTER — Telehealth: Payer: Self-pay

## 2022-12-26 NOTE — Telephone Encounter (Signed)
**Note De-Identified Merle Whitehorn Obfuscation** Entresto PA started through covermymeds. Key: [id number]

## 2022-12-26 NOTE — Telephone Encounter (Signed)
**Note De-Identified Diamante Rubin Obfuscation** Darius Rivers (Key: [id number]) PA Case ID #: [id number] Rx #: [id number] Outcome: Approved today ENTRESTO TAB 97-'103MG'$  is approved through 12/27/2023. Your patient may now fill this prescription and it will be covered. Authorization Expiration Date: 12/27/2023 Drug [name] 97-'103MG'$  tablets ePA cloud logo Form [employer] Electronic Prior Authorization Form [phone] NCPDP)  I have notified [facility] [id number] - [place] (Ph: [phone]) of this approval.

## 2023-01-01 ENCOUNTER — Other Ambulatory Visit: Payer: Self-pay

## 2023-01-01 MED ORDER — SPIRONOLACTONE 25 MG PO TABS: 25.0000 mg | ORAL_TABLET | Freq: Every day | ORAL | 3 refills | Status: DC

## 2023-02-04 ENCOUNTER — Telehealth: Payer: Self-pay | Admitting: *Deleted

## 2023-02-04 DIAGNOSIS — E782 Mixed hyperlipidemia: Secondary | ICD-10-CM

## 2023-02-04 DIAGNOSIS — Z79899 Other long term (current) drug therapy: Secondary | ICD-10-CM

## 2023-02-04 DIAGNOSIS — N1831 Chronic kidney disease, stage 3a: Secondary | ICD-10-CM

## 2023-02-04 DIAGNOSIS — I10 Essential (primary) hypertension: Secondary | ICD-10-CM

## 2023-02-04 DIAGNOSIS — I5042 Chronic combined systolic (congestive) and diastolic (congestive) heart failure: Secondary | ICD-10-CM

## 2023-02-04 DIAGNOSIS — I351 Nonrheumatic aortic (valve) insufficiency: Secondary | ICD-10-CM

## 2023-02-04 MED ORDER — ISOSORBIDE MONONITRATE ER 30 MG PO TB24: 90.0000 mg | ORAL_TABLET | Freq: Two times a day (BID) | ORAL | 9 refills | Status: DC

## 2023-02-04 NOTE — Telephone Encounter (Signed)
error 

## 2023-07-17 IMAGING — CT CT ANGIO CHEST
3 of 8 series · 18 of 46 positions shown · IV contrast (OMNIPAQUE 350)
Comparison: None.

CLINICAL DATA: Thoracic aortic aneurysm by echo measuring 4.4 cm

EXAM:
CT ANGIOGRAPHY CHEST WITH CONTRAST
TECHNIQUE: Multidetector CT imaging of the chest was performed using the
standard protocol during bolus administration of intravenous
contrast. Multiplanar CT image reconstructions and MIPs were
obtained to evaluate the vascular anatomy.
CONTRAST:  80mL OMNIPAQUE IOHEXOL 350 MG/ML SOLN

[Series 4: aorta 3.0 bf37 2 · axial · 0.71mm/px · z∈[+1230,+1527]mm · 12 of 118 slices shown]
[im 10/118  lung]
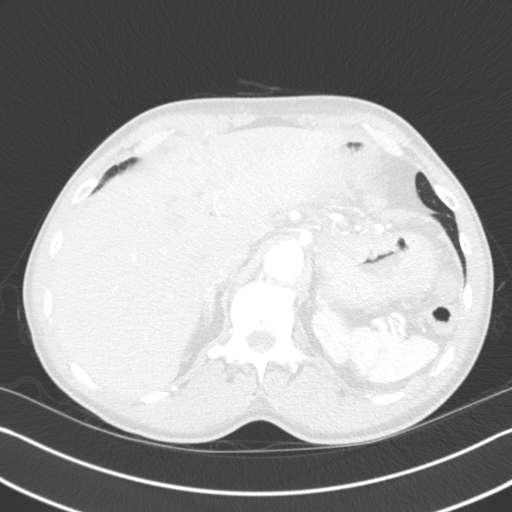
[im 19/118  soft-tissue]
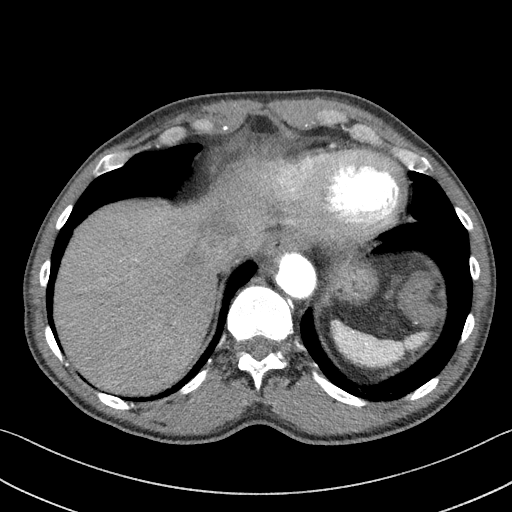
[im 28/118  lung]
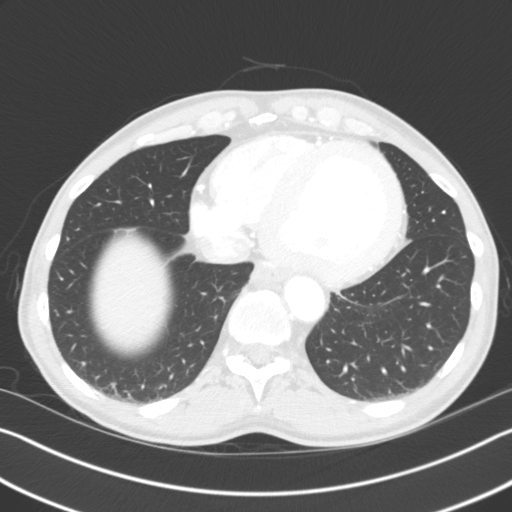
[im 37/118  soft-tissue]
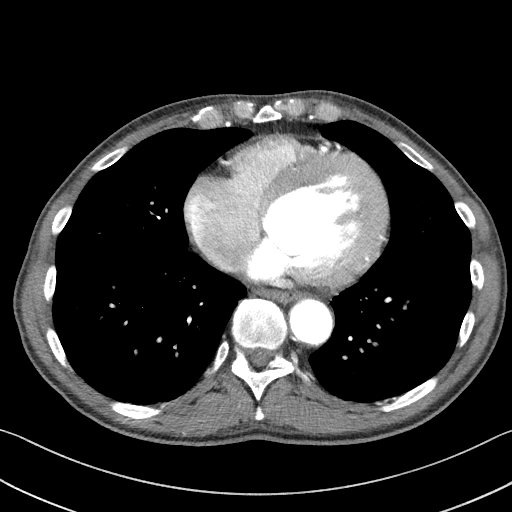
[im 46/118  lung]
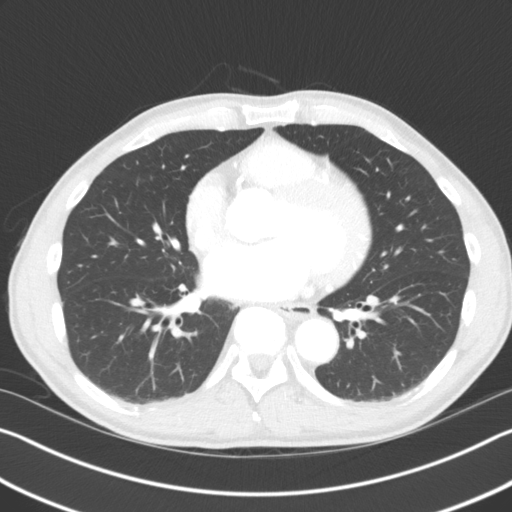
[im 55/118  soft-tissue]
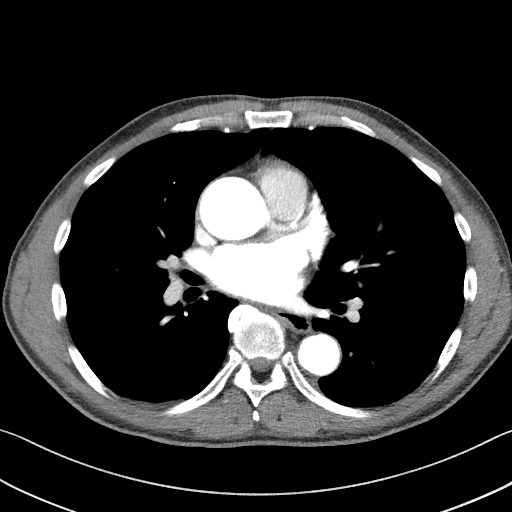
[im 64/118  lung]
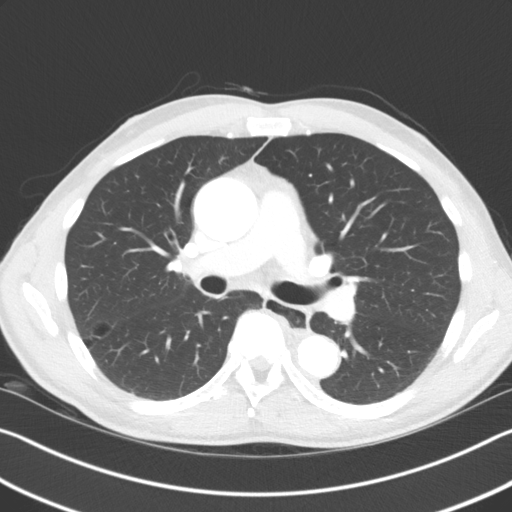
[im 73/118  soft-tissue]
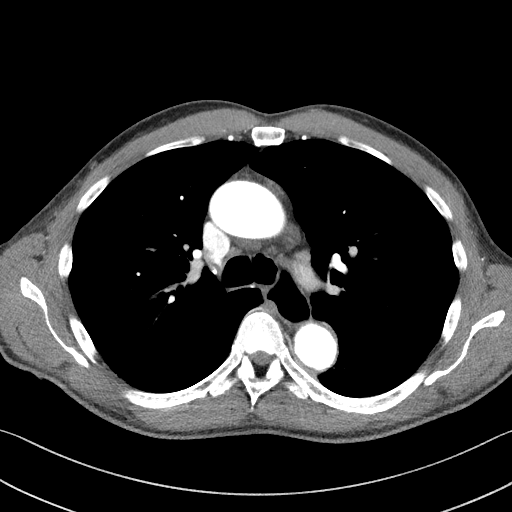
[im 82/118  lung]
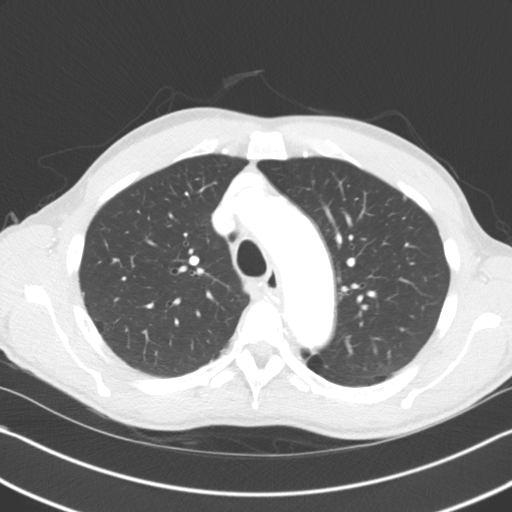
[im 91/118  soft-tissue]
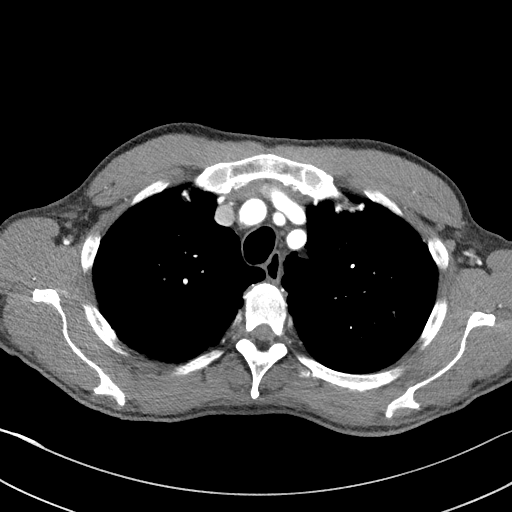
[im 100/118  lung]
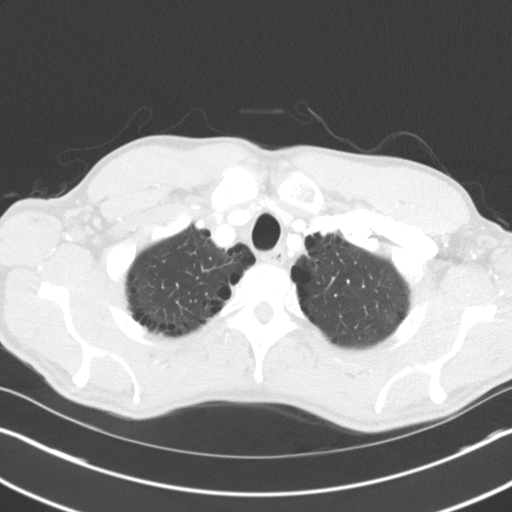
[im 109/118  soft-tissue]
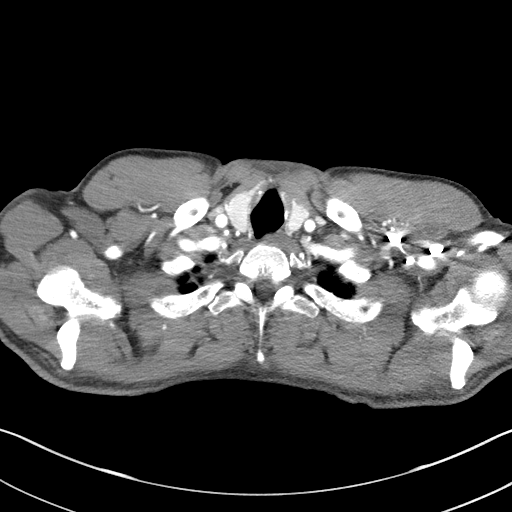

[Series 5: lung · axial · 0.71mm/px · z∈[+1227,+1326]mm · 3 of 118 slices shown]
[im 9/118  soft-tissue]
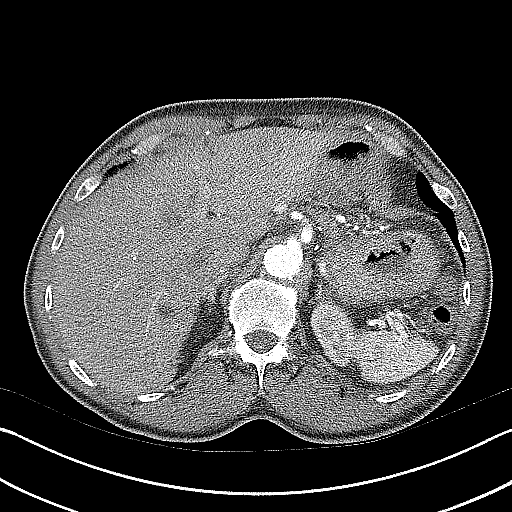
[im 26/118  soft-tissue]
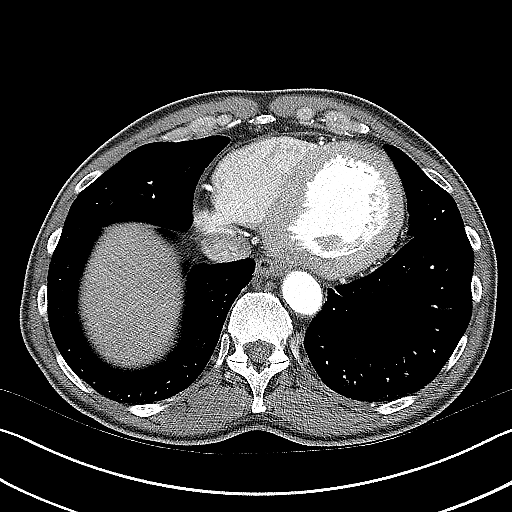
[im 42/118  soft-tissue]
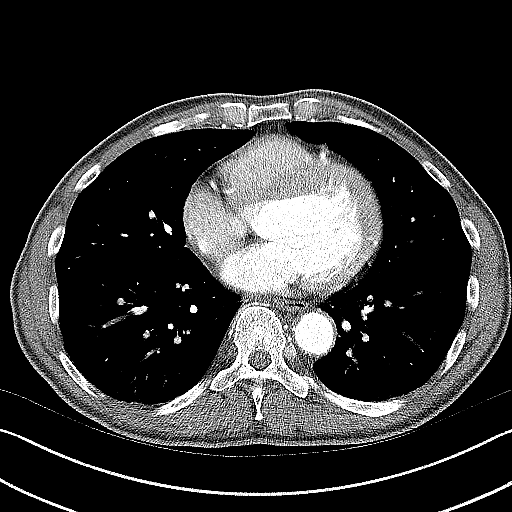

[Series 7: coronals · coronal · 0.68mm/px · 3 of 126 slices shown]
[im 32/126  soft-tissue]
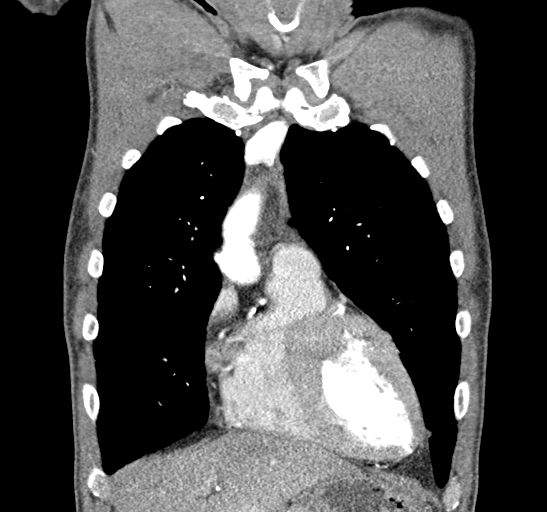
[im 63/126  soft-tissue]
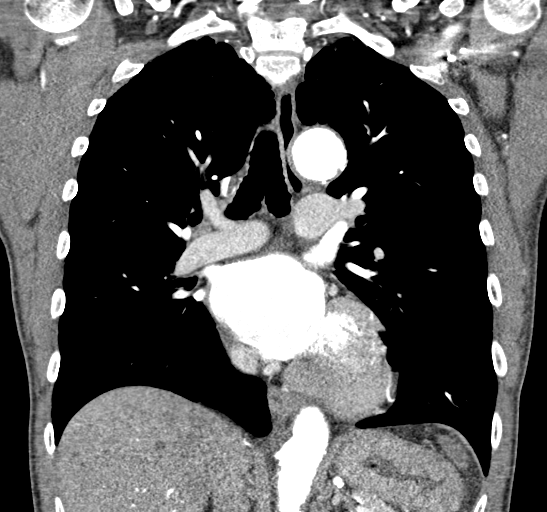
[im 94/126  soft-tissue]
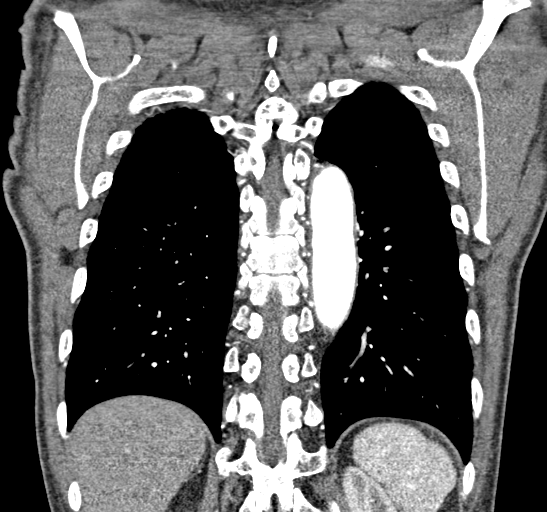

[18 of 46 positions shown; findings below may reference images not displayed]

FINDINGS: Cardiovascular: Mild fusiform aneurysmal dilatation of the ascending
thoracic aorta, maximal diameter 4.4 cm. Remainder of the aorta is
normal in caliber. Minor associated atherosclerotic change. Negative
for dissection. No mediastinal hemorrhage or hematoma. Patent 3
vessel arch anatomy.

Central pulmonary arteries are normal in caliber and patent.
Negative for significant acute pulmonary embolus.

Normal heart size. No pericardial or pleural effusion. Native
coronary atherosclerosis noted.

Mediastinum/Nodes: No enlarged mediastinal, hilar, or axillary lymph
nodes. Thyroid gland, trachea, and esophagus demonstrate no
significant findings.

Lungs/Pleura: Bilateral upper lobe predominant paraseptal emphysema.

Anterior subpleural left upper lobe nodule measures 3 mm, image 38
series 5.

Minor dependent bibasilar atelectasis.

No acute airspace process, pneumonia, collapse or consolidation. No
interstitial process or edema.

No pleural abnormality, effusion or pneumothorax.

Trachea central airways are patent.

Upper Abdomen: Scattered hepatic hypodense cysts noted in the
included portion of the liver. No biliary dilatation or obstruction.
No acute upper abdominal finding. Abdominal aortic atherosclerosis
noted. Celiac and SMA remain patent.

Musculoskeletal: Degenerative changes noted of the spine. Symmetric
gynecomastia. No acute osseous finding. Sternum intact.

Review of the MIP images confirms the above findings.
IMPRESSION: 4.4 cm ascending thoracic aortic aneurysm.

Recommend annual imaging followup by CTA or MRA. This recommendation
follows 6303 ACCF/AHA/AATS/ACR/ASA/SCA/MAO/FERIENHAUS/MARICAR/DA PAZ Guidelines
for the Diagnosis and Management of Patients with Thoracic Aortic
Disease. Circulation. 6303; 121: E266-e369. Aortic aneurysm NOS
(P6SPZ-BN7.7)

No other acute finding by chest CTA.

3 mm left upper lobe pulmonary nodule No follow-up needed if patient
is low-risk. Non-contrast chest CT can be considered in 12 months if
patient is high-risk. This recommendation follows the consensus
statement: Guidelines for Management of Incidental Pulmonary Nodules
Detected on CT Images: From the [HOSPITAL] 8045; Radiology
8045; [DATE].

Aortic Atherosclerosis (P6SPZ-Q81.1) and Emphysema (P6SPZ-L3C.C).

Aortic aneurysm NOS (P6SPZ-BN7.7).

## 2023-09-07 ENCOUNTER — Other Ambulatory Visit (HOSPITAL_COMMUNITY): Payer: 59

## 2023-09-15 ENCOUNTER — Other Ambulatory Visit: Payer: Self-pay

## 2023-09-15 DIAGNOSIS — I5042 Chronic combined systolic (congestive) and diastolic (congestive) heart failure: Secondary | ICD-10-CM

## 2023-09-15 DIAGNOSIS — I10 Essential (primary) hypertension: Secondary | ICD-10-CM

## 2023-09-15 DIAGNOSIS — Z79899 Other long term (current) drug therapy: Secondary | ICD-10-CM

## 2023-09-15 DIAGNOSIS — E782 Mixed hyperlipidemia: Secondary | ICD-10-CM

## 2023-09-15 DIAGNOSIS — I351 Nonrheumatic aortic (valve) insufficiency: Secondary | ICD-10-CM

## 2023-09-15 DIAGNOSIS — N1831 Chronic kidney disease, stage 3a: Secondary | ICD-10-CM

## 2023-09-15 MED ORDER — ENTRESTO 97-103 MG PO TABS: 1.0000 | ORAL_TABLET | Freq: Two times a day (BID) | ORAL | 0 refills | Status: DC

## 2023-09-16 ENCOUNTER — Ambulatory Visit (HOSPITAL_COMMUNITY): Payer: 59 | Attending: Cardiology

## 2023-09-16 DIAGNOSIS — I340 Nonrheumatic mitral (valve) insufficiency: Secondary | ICD-10-CM | POA: Insufficient documentation

## 2023-09-16 DIAGNOSIS — I77819 Aortic ectasia, unspecified site: Secondary | ICD-10-CM | POA: Insufficient documentation

## 2023-09-16 LAB — ECHOCARDIOGRAM COMPLETE: S' Lateral: 5.92 cm

## 2023-09-18 ENCOUNTER — Other Ambulatory Visit: Payer: Self-pay

## 2023-09-18 ENCOUNTER — Telehealth: Payer: Self-pay | Admitting: *Deleted

## 2023-09-18 DIAGNOSIS — I351 Nonrheumatic aortic (valve) insufficiency: Secondary | ICD-10-CM

## 2023-09-18 DIAGNOSIS — I5042 Chronic combined systolic (congestive) and diastolic (congestive) heart failure: Secondary | ICD-10-CM

## 2023-09-18 DIAGNOSIS — I7121 Aneurysm of the ascending aorta, without rupture: Secondary | ICD-10-CM

## 2023-09-18 DIAGNOSIS — I5020 Unspecified systolic (congestive) heart failure: Secondary | ICD-10-CM

## 2023-09-18 MED ORDER — EZETIMIBE 10 MG PO TABS: 10.0000 mg | ORAL_TABLET | Freq: Every day | ORAL | 0 refills | Status: DC

## 2023-09-18 NOTE — Telephone Encounter (Signed)
-----   Message from [place] sent at 09/18/2023  9:05 AM EST ----- Heart function (ejection fraction) is reduced but overall stable.  Aortic valve leakage (aortic insufficiency) is moderate to severe. This may be slightly worse compared to the last study. Ascending aorta remains dilated. He has a CT ordered to follow up on his aneurysm. It does not look like it has been scheduled yet. I reviewed his echocardiogram with Dr. Izora Ribas. A cardiac MRI and chest MRA will help [place] evaluate his ejection fraction, aortic insufficiency and ascending aortic aneurysm better.  PLAN:  -Cancel chest CTA -Order cardiac MRI, chest/aorta MRA (Dx: aortic insufficiency, ascending thoracic aortic aneurysm) Tereso Newcomer, PA-C    09/18/2023 9:00 AM

## 2023-11-11 ENCOUNTER — Ambulatory Visit: Payer: 59 | Admitting: Physician Assistant

## 2023-11-24 ENCOUNTER — Telehealth (HOSPITAL_COMMUNITY): Payer: Self-pay | Admitting: *Deleted

## 2023-11-24 NOTE — Telephone Encounter (Signed)
 Attempted to call patient regarding upcoming cardiac MRI appointment. Left message on voicemail with name and callback number Johney Frame RN Navigator Cardiac Imaging [facility] [phone] Office

## 2023-11-25 ENCOUNTER — Ambulatory Visit (HOSPITAL_COMMUNITY): Payer: 59 | Attending: Physician Assistant

## 2023-11-25 ENCOUNTER — Ambulatory Visit (HOSPITAL_COMMUNITY): Admission: RE | Admit: 2023-11-25 | Payer: 59 | Source: Ambulatory Visit

## 2023-12-03 DIAGNOSIS — I2510 Atherosclerotic heart disease of native coronary artery without angina pectoris: Secondary | ICD-10-CM | POA: Insufficient documentation

## 2023-12-03 NOTE — Progress Notes (Signed)
Cardiology Office Note:    Date:  12/04/2023  ID:  Darius Rivers, DOB [birth year], MRN [id number] PCP: Clide Dales, PA  [facility] Providers Cardiologist:  Christell Constant, MD Cardiology APP:  Beatrice Lecher, PA-C       Patient Profile:      Coronary artery disease NSTEMI in 11/2018 >> Myoview 11/2018: Lg inf scar, EF 34 >> Med Rx 2/2 CKD Cath 4/22: non-obstructive CAD [mLAD 15] (HFrEF) heart failure with reduced ejection fraction  Non-ischemic cardiomyopathy TTE 11/2018: EF 40-45 TTE 4/22:  EF 25-30 >> TTE 7/22: EF 40-45 TTE 09/23/22: EF 30-35, global HK severe LVE, Gr 1 DD, NL RVSF, mild MR< mod AI, AV sclerosis, no AS, mild dilation of Ao root (43 mm), mild dilation of Asc Aorta (44 mm), RAP 8 TTE 09/16/23: EF 35-40, global HK, mod conc LVH, Gr 2 DD, GLS -11.7, NL RVSF, severe LAE, mild to mod MR, severe AI (reviewed with Dr. Izora Ribas >> mod to severe), aortic root 40 mm, ascending aorta 45 mm, RAP 3  Moderate aortic insufficiency  TTE 09/2023: mod to severe >> CMR recommended (not done - 11/2023) NSVT Chronic kidney disease Ascending thoracic aortic aneurysm TTE 4/22; TTE 11/23:  44 mm CT 7/22: 44 mm >> rpt 1 year  CT 12/2022: 4.5 cm  Lung nodule (3 mm LUL by CT in 7/22)  +Tobacco smoking  Hypertension Hyperlipidemia Prostate CA  Aortic atherosclerosis         History of Present Illness:  Discussed the use of AI scribe software for clinical note transcription with the patient, who gave verbal consent to proceed.  Darius Rivers is a 63 y.o. male who returns for follow up of CHF, AI, ascending aortic aneurysm. He was last seen in 11/2022. Follow up TTE in 09/2023 showed mod to severe AI and stable EF. I reviewed the study with Dr. Izora Ribas and we decided to cancel his follow up chest CT for surveillance of his TAA and get a Cardiac MRI and chest MRA. This was scheduled last week but the pt did not have it done.   He is here alone.  He  has been doing well without chest pain, shortness of breath, syncope, orthopnea, leg edema.  He continues to work at [employer].  He was given a exercise program by the staff there.  He is doing well with this.  Review of Systems  Gastrointestinal:  Negative for hematochezia and melena.  Genitourinary:  Negative for hematuria.  -See HPI    Studies Reviewed:   EKG Interpretation Date/Time:  Friday December 04 2023 08:06:01 EST Ventricular Rate:  71 PR Interval:  180 QRS Duration:  130 QT Interval:  454 QTC Calculation: 493 R Axis:   -52  Text Interpretation: Normal sinus rhythm Possible Left atrial enlargement Left axis deviation Left ventricular hypertrophy with QRS widening and repolarization abnormality Cannot rule out Septal infarct , age undetermined Inferior infarct , age undetermined No significant change since last tracing in 08/2022 Confirmed by Tereso Newcomer [phone]) on 12/04/2023 8:43:15 AM     Risk Assessment/Calculations:             Physical Exam:   VS:  BP 134/86   Pulse 71   Ht 6' 4.5" (1.943 m)   Wt 183 lb 6.4 oz (83.2 kg)   SpO2 97%   BMI 22.03 kg/m    Wt Readings from Last 3 Encounters:  12/04/23 183 lb 6.4 oz (83.2 kg)  11/25/22 189 lb 9.6 oz (86 kg)  08/25/22 187 lb 6.4 oz (85 kg)    Constitutional:      Appearance: Healthy appearance. Not in distress.  Neck:     Vascular: JVD normal.  Pulmonary:     Breath sounds: Normal breath sounds. No wheezing. No rales.  Cardiovascular:     Normal rate. Regular rhythm.     Murmurs: There is a grade 2/6 systolic murmur at the URSB.  Edema:    Peripheral edema absent.  Abdominal:     Palpations: Abdomen is soft.      Assessment and Plan:   Assessment & Plan HFrEF (heart failure with reduced ejection fraction) (HCC) Non-ischemic cardiomyopathy. NYHA II. Volume status stable.  He has not been able to afford SGLT2 diameter in the past.  LVF was stable on echocardiogram in November 2024 with EF  35-40. -Continue carvedilol 25 mg twice daily, hydralazine 100 mg 3 times a day, Imdur 90 mg daily, Entresto 97/103 mg twice daily, spironolactone 25 mg daily -CMET, CBC today -Follow-up 6 months Aneurysm of ascending aorta without rupture (HCC) 4.5 cm by CT in February 2024.  Ascending aorta 45 mm on echocardiogram in November 2024.  Patient was to undergo cardiac MRI and chest MRA 2 weeks ago but he did not show for this.  He notes that he was not contacted. -Reschedule cardiac MRI and chest MRA Moderate aortic insufficiency Previous echo in November 2024 was reviewed with Dr. Raynelle Jan.  Patient has moderate to severe AI.  I am unable to hear an AI murmur on exam.  He is currently clinically stable.  As noted, he needs cardiac MRI to further assess his aortic valve. -Reschedule cardiac MRI, chest MRA Coronary artery disease involving native coronary artery of native heart without angina pectoris History of non-STEMI in 2020 managed medically due to chronic kidney disease.  Cardiac cath in 2022 with nonobstructive CAD.  He is not having symptoms of angina. -Continue ASA 81 mg daily, carvedilol 25 mg twice daily, Imdur 90 mg daily, Crestor 40 mg daily Essential hypertension The patient's blood pressure is controlled on his current regimen.  Continue current therapy.  Mixed hyperlipidemia Continue Crestor 40 mg once daily, Zetia 10 mg once daily.  -CMET, Lipids today         Dispo:  Return in about 6 months (around 06/02/2024) for Routine Follow Up, w/ Dr. Izora Ribas, or Tereso Newcomer, PA-C.  Signed, Tereso Newcomer, PA-C

## 2023-12-04 ENCOUNTER — Encounter: Payer: Self-pay | Admitting: Physician Assistant

## 2023-12-04 ENCOUNTER — Ambulatory Visit: Payer: 59 | Attending: Physician Assistant | Admitting: Physician Assistant

## 2023-12-04 VITALS — BP 134/86 | HR 71 | Ht 76.5 in | Wt 183.4 lb

## 2023-12-04 DIAGNOSIS — I351 Nonrheumatic aortic (valve) insufficiency: Secondary | ICD-10-CM | POA: Diagnosis not present

## 2023-12-04 DIAGNOSIS — I7121 Aneurysm of the ascending aorta, without rupture: Secondary | ICD-10-CM | POA: Diagnosis not present

## 2023-12-04 DIAGNOSIS — I2510 Atherosclerotic heart disease of native coronary artery without angina pectoris: Secondary | ICD-10-CM | POA: Diagnosis not present

## 2023-12-04 DIAGNOSIS — E782 Mixed hyperlipidemia: Secondary | ICD-10-CM

## 2023-12-04 DIAGNOSIS — I5020 Unspecified systolic (congestive) heart failure: Secondary | ICD-10-CM | POA: Diagnosis not present

## 2023-12-04 DIAGNOSIS — I10 Essential (primary) hypertension: Secondary | ICD-10-CM

## 2023-12-04 NOTE — Assessment & Plan Note (Signed)
Continue Crestor 40 mg once daily, Zetia 10 mg once daily.  -CMET, Lipids today

## 2023-12-04 NOTE — Assessment & Plan Note (Signed)
History of non-STEMI in 2020 managed medically due to chronic kidney disease.  Cardiac cath in 2022 with nonobstructive CAD.  He is not having symptoms of angina. -Continue ASA 81 mg daily, carvedilol 25 mg twice daily, Imdur 90 mg daily, Crestor 40 mg daily

## 2023-12-04 NOTE — Patient Instructions (Addendum)
Medication Instructions:  Your physician recommends that you continue on your current medications as directed. Please refer to the Current Medication list given to you today.  *If you need a refill on your cardiac medications before your next appointment, please call your pharmacy*   Lab Work: TODAY: CMET, LIPID, & CBC     If you have labs (blood work) drawn today and your tests are completely normal, you will receive your results only by: MyChart Message (if you have MyChart) OR A paper copy in the mail If you have any lab test that is abnormal or we need to change your treatment, we will call you to review the results.   Testing/Procedures: None ordered today.  Stop at Check out and reschedule the MRI.   Follow-Up: At [facility], you and your health needs are our priority.  As part of our continuing mission to provide you with exceptional heart care, we have created designated Provider Care Teams.  These Care Teams include your primary Cardiologist (physician) and Advanced Practice Providers (APPs -  Physician Assistants and Nurse Practitioners) who all work together to provide you with the care you need, when you need it.  We recommend signing up for the patient portal called "MyChart".  Sign up information is provided on this After Visit Summary.  MyChart is used to connect with patients for Virtual Visits (Telemedicine).  Patients are able to view lab/test results, encounter notes, upcoming appointments, etc.  Non-urgent messages can be sent to your provider as well.   To learn more about what you can do with MyChart, go to [email or web address].    Your next appointment:   6 month(s)  Provider:   Riley Lam, MD or Tereso Newcomer, PA-C         Other Instructions   1st Floor: - Lobby - Registration  - Pharmacy  - Lab - Cafe  2nd Floor: - PV Lab - Diagnostic Testing (echo, CT, nuclear med)  3rd Floor: - Vacant  4th Floor: - TCTS  (cardiothoracic surgery) - AFib Clinic - Structural Heart Clinic - Vascular Surgery  - Vascular Ultrasound  5th Floor: - HeartCare Cardiology (general and EP) - Clinical Pharmacy for coumadin, hypertension, lipid, weight-loss medications, and med management appointments    Valet parking services will be available as well.

## 2023-12-04 NOTE — Assessment & Plan Note (Signed)
Non-ischemic cardiomyopathy. NYHA II. Volume status stable.  He has not been able to afford SGLT2 diameter in the past.  LVF was stable on echocardiogram in November 2024 with EF 35-40. -Continue carvedilol 25 mg twice daily, hydralazine 100 mg 3 times a day, Imdur 90 mg daily, Entresto 97/103 mg twice daily, spironolactone 25 mg daily -CMET, CBC today -Follow-up 6 months

## 2023-12-04 NOTE — Assessment & Plan Note (Addendum)
The patient's blood pressure is controlled on his current regimen.  Continue current therapy.  

## 2023-12-04 NOTE — Assessment & Plan Note (Signed)
Previous echo in November 2024 was reviewed with Dr. Raynelle Jan.  Patient has moderate to severe AI.  I am unable to hear an AI murmur on exam.  He is currently clinically stable.  As noted, he needs cardiac MRI to further assess his aortic valve. -Reschedule cardiac MRI, chest MRA

## 2023-12-04 NOTE — Assessment & Plan Note (Signed)
4.5 cm by CT in February 2024.  Ascending aorta 45 mm on echocardiogram in November 2024.  Patient was to undergo cardiac MRI and chest MRA 2 weeks ago but he did not show for this.  He notes that he was not contacted. -Reschedule cardiac MRI and chest MRA

## 2023-12-05 LAB — COMPREHENSIVE METABOLIC PANEL
ALT: 11 [IU]/L (ref 0–44)
AST: 14 [IU]/L (ref 0–40)
Albumin: 3.5 g/dL — ABNORMAL LOW (ref 3.8–4.9)
Alkaline Phosphatase: 64 [IU]/L (ref 44–121)
BUN/Creatinine Ratio: 11 (ref 10–24)
BUN: 14 mg/dL (ref 8–27)
Bilirubin Total: 0.3 mg/dL (ref 0.0–1.2)
CO2: 21 mmol/L (ref 20–29)
Calcium: 8.7 mg/dL (ref 8.6–10.2)
Chloride: 106 mmol/L (ref 96–106)
Creatinine, Ser: 1.31 mg/dL — ABNORMAL HIGH (ref 0.76–1.27)
Globulin, Total: 2.7 g/dL (ref 1.5–4.5)
Glucose: 88 mg/dL (ref 70–99)
Potassium: 3.9 mmol/L (ref 3.5–5.2)
Sodium: 141 mmol/L (ref 134–144)
Total Protein: 6.2 g/dL (ref 6.0–8.5)
eGFR: 62 mL/min/{1.73_m2} (ref >59)

## 2023-12-05 LAB — CBC
Hematocrit: 29.9 % — ABNORMAL LOW (ref 37.5–51.0)
Hemoglobin: 9.3 g/dL — ABNORMAL LOW (ref 13.0–17.7)
MCH: 30.3 pg (ref 26.6–33.0)
MCHC: 31.1 g/dL — ABNORMAL LOW (ref 31.5–35.7)
MCV: 97 fL (ref 79–97)
Platelets: 354 10*3/uL (ref 150–450)
RBC: 3.07 x10E6/uL — ABNORMAL LOW (ref 4.14–5.80)
RDW: 14.2 % (ref 11.6–15.4)
WBC: 6.9 10*3/uL (ref 3.4–10.8)

## 2023-12-05 LAB — LIPID PANEL
Chol/HDL Ratio: 2.8 {ratio} (ref 0.0–5.0)
Cholesterol, Total: 91 mg/dL — ABNORMAL LOW (ref 100–199)
HDL: 33 mg/dL — ABNORMAL LOW (ref >39)
LDL Chol Calc (NIH): 47 mg/dL (ref 0–99)
Triglycerides: 40 mg/dL (ref 0–149)
VLDL Cholesterol Cal: 11 mg/dL (ref 5–40)

## 2023-12-09 ENCOUNTER — Other Ambulatory Visit: Payer: Self-pay | Admitting: *Deleted

## 2023-12-11 ENCOUNTER — Encounter: Payer: Self-pay | Admitting: *Deleted

## 2023-12-14 ENCOUNTER — Other Ambulatory Visit: Payer: Self-pay

## 2023-12-14 ENCOUNTER — Observation Stay (HOSPITAL_BASED_OUTPATIENT_CLINIC_OR_DEPARTMENT_OTHER)
Admission: EM | Admit: 2023-12-14 | Discharge: 2023-12-15 | Disposition: A | Discharge status: Home / Self Care | Payer: 59 | Attending: Internal Medicine | Admitting: Internal Medicine

## 2023-12-14 ENCOUNTER — Emergency Department (HOSPITAL_BASED_OUTPATIENT_CLINIC_OR_DEPARTMENT_OTHER): Payer: 59

## 2023-12-14 ENCOUNTER — Encounter (HOSPITAL_BASED_OUTPATIENT_CLINIC_OR_DEPARTMENT_OTHER): Payer: Self-pay

## 2023-12-14 DIAGNOSIS — Z7982 Long term (current) use of aspirin: Secondary | ICD-10-CM | POA: Insufficient documentation

## 2023-12-14 DIAGNOSIS — R778 Other specified abnormalities of plasma proteins: Secondary | ICD-10-CM | POA: Diagnosis not present

## 2023-12-14 DIAGNOSIS — Z87891 Personal history of nicotine dependence: Secondary | ICD-10-CM | POA: Insufficient documentation

## 2023-12-14 DIAGNOSIS — I509 Heart failure, unspecified: Secondary | ICD-10-CM

## 2023-12-14 DIAGNOSIS — Z79899 Other long term (current) drug therapy: Secondary | ICD-10-CM | POA: Insufficient documentation

## 2023-12-14 DIAGNOSIS — I10 Essential (primary) hypertension: Secondary | ICD-10-CM | POA: Diagnosis present

## 2023-12-14 DIAGNOSIS — I472 Ventricular tachycardia, unspecified: Secondary | ICD-10-CM | POA: Insufficient documentation

## 2023-12-14 DIAGNOSIS — I130 Hypertensive heart and chronic kidney disease with heart failure and stage 1 through stage 4 chronic kidney disease, or unspecified chronic kidney disease: Secondary | ICD-10-CM | POA: Insufficient documentation

## 2023-12-14 DIAGNOSIS — R059 Cough, unspecified: Secondary | ICD-10-CM | POA: Diagnosis not present

## 2023-12-14 DIAGNOSIS — I5043 Acute on chronic combined systolic (congestive) and diastolic (congestive) heart failure: Principal | ICD-10-CM | POA: Insufficient documentation

## 2023-12-14 DIAGNOSIS — N182 Chronic kidney disease, stage 2 (mild): Secondary | ICD-10-CM | POA: Diagnosis not present

## 2023-12-14 DIAGNOSIS — I2510 Atherosclerotic heart disease of native coronary artery without angina pectoris: Secondary | ICD-10-CM | POA: Diagnosis present

## 2023-12-14 DIAGNOSIS — E785 Hyperlipidemia, unspecified: Secondary | ICD-10-CM | POA: Diagnosis not present

## 2023-12-14 DIAGNOSIS — I5023 Acute on chronic systolic (congestive) heart failure: Principal | ICD-10-CM

## 2023-12-14 DIAGNOSIS — I4729 Other ventricular tachycardia: Secondary | ICD-10-CM | POA: Diagnosis present

## 2023-12-14 DIAGNOSIS — I351 Nonrheumatic aortic (valve) insufficiency: Secondary | ICD-10-CM | POA: Insufficient documentation

## 2023-12-14 DIAGNOSIS — R0602 Shortness of breath: Secondary | ICD-10-CM | POA: Diagnosis present

## 2023-12-14 DIAGNOSIS — I429 Cardiomyopathy, unspecified: Secondary | ICD-10-CM | POA: Diagnosis not present

## 2023-12-14 DIAGNOSIS — I7121 Aneurysm of the ascending aorta, without rupture: Secondary | ICD-10-CM | POA: Diagnosis present

## 2023-12-14 LAB — CBC WITH DIFFERENTIAL/PLATELET
Abs Immature Granulocytes: 0.02 10*3/uL (ref 0.00–0.07)
Basophils Absolute: 0 10*3/uL (ref 0.0–0.1)
Basophils Relative: 0 %
Eosinophils Absolute: 0 10*3/uL (ref 0.0–0.5)
Eosinophils Relative: 1 %
HCT: 34.8 % — ABNORMAL LOW (ref 39.0–52.0)
Hemoglobin: 11.3 g/dL — ABNORMAL LOW (ref 13.0–17.0)
Immature Granulocytes: 0 %
Lymphocytes Relative: 9 %
Lymphs Abs: 0.8 10*3/uL (ref 0.7–4.0)
MCH: 29.8 pg (ref 26.0–34.0)
MCHC: 32.5 g/dL (ref 30.0–36.0)
MCV: 91.8 fL (ref 80.0–100.0)
Monocytes Absolute: 0.3 10*3/uL (ref 0.1–1.0)
Monocytes Relative: 4 %
Neutro Abs: 7.6 10*3/uL (ref 1.7–7.7)
Neutrophils Relative %: 86 %
Platelets: 385 10*3/uL (ref 150–400)
RBC: 3.79 MIL/uL — ABNORMAL LOW (ref 4.22–5.81)
RDW: 16 % — ABNORMAL HIGH (ref 11.5–15.5)
WBC: 8.7 10*3/uL (ref 4.0–10.5)
nRBC: 0 % (ref 0.0–0.2)

## 2023-12-14 LAB — RESP PANEL BY RT-PCR (RSV, FLU A&B, COVID)  RVPGX2
Influenza A by PCR: NEGATIVE
Influenza B by PCR: NEGATIVE
Resp Syncytial Virus by PCR: NEGATIVE
SARS Coronavirus 2 by RT PCR: NEGATIVE

## 2023-12-14 LAB — HIV ANTIBODY (ROUTINE TESTING W REFLEX): HIV Screen 4th Generation wRfx: NONREACTIVE

## 2023-12-14 LAB — COMPREHENSIVE METABOLIC PANEL
ALT: 10 U/L (ref 0–44)
AST: 19 U/L (ref 15–41)
Albumin: 4.5 g/dL (ref 3.5–5.0)
Alkaline Phosphatase: 62 U/L (ref 38–126)
Anion gap: 14 (ref 5–15)
BUN: 21 mg/dL — ABNORMAL HIGH (ref 6–20)
CO2: 20 mmol/L — ABNORMAL LOW (ref 22–32)
Calcium: 10.4 mg/dL — ABNORMAL HIGH (ref 8.9–10.3)
Chloride: 106 mmol/L (ref 98–111)
Creatinine, Ser: 1.26 mg/dL — ABNORMAL HIGH (ref 0.61–1.24)
GFR, Estimated: >60 mL/min (ref >60)
Glucose, Bld: 118 mg/dL — ABNORMAL HIGH (ref 70–99)
Potassium: 4.3 mmol/L (ref 3.5–5.1)
Sodium: 140 mmol/L (ref 135–145)
Total Bilirubin: 0.9 mg/dL (ref 0.0–1.2)
Total Protein: 8.4 g/dL — ABNORMAL HIGH (ref 6.5–8.1)

## 2023-12-14 LAB — CBC
HCT: 35.3 % — ABNORMAL LOW (ref 39.0–52.0)
Hemoglobin: 11.6 g/dL — ABNORMAL LOW (ref 13.0–17.0)
MCH: 30 pg (ref 26.0–34.0)
MCHC: 32.9 g/dL (ref 30.0–36.0)
MCV: 91.2 fL (ref 80.0–100.0)
Platelets: 383 10*3/uL (ref 150–400)
RBC: 3.87 MIL/uL — ABNORMAL LOW (ref 4.22–5.81)
RDW: 15.9 % — ABNORMAL HIGH (ref 11.5–15.5)
WBC: 12.4 10*3/uL — ABNORMAL HIGH (ref 4.0–10.5)
nRBC: 0 % (ref 0.0–0.2)

## 2023-12-14 LAB — BRAIN NATRIURETIC PEPTIDE: B Natriuretic Peptide: >4500 pg/mL — ABNORMAL HIGH (ref 0.0–100.0)

## 2023-12-14 LAB — CREATININE, SERUM
Creatinine, Ser: 1.57 mg/dL — ABNORMAL HIGH (ref 0.61–1.24)
GFR, Estimated: 50 mL/min — ABNORMAL LOW (ref >60)

## 2023-12-14 LAB — TROPONIN I (HIGH SENSITIVITY)
Troponin I (High Sensitivity): 141 ng/L (ref <18)
Troponin I (High Sensitivity): 167 ng/L (ref <18)

## 2023-12-14 MED ORDER — SPIRONOLACTONE 25 MG PO TABS
25.0000 mg | ORAL_TABLET | Freq: Every day | ORAL | Status: DC
  Administered 2023-12-15: 25 mg via ORAL
  Filled 2023-12-14: qty 1

## 2023-12-14 MED ORDER — ROSUVASTATIN CALCIUM 20 MG PO TABS: 40.0000 mg | ORAL_TABLET | Freq: Every day | ORAL | Status: DC

## 2023-12-14 MED ORDER — SODIUM CHLORIDE 0.9% FLUSH: 3.0000 mL | INTRAVENOUS | Status: DC | PRN

## 2023-12-14 MED ORDER — ISOSORBIDE MONONITRATE ER 60 MG PO TB24
90.0000 mg | ORAL_TABLET | Freq: Two times a day (BID) | ORAL | Status: DC
  Administered 2023-12-15 (×2): 90 mg via ORAL
  Filled 2023-12-14 (×2): qty 1

## 2023-12-14 MED ORDER — SACUBITRIL-VALSARTAN 97-103 MG PO TABS
1.0000 | ORAL_TABLET | Freq: Two times a day (BID) | ORAL | Status: DC
  Administered 2023-12-14 – 2023-12-15 (×2): 1 via ORAL
  Filled 2023-12-14 (×2): qty 1

## 2023-12-14 MED ORDER — ACETAMINOPHEN 325 MG PO TABS: 650.0000 mg | ORAL_TABLET | ORAL | Status: DC | PRN

## 2023-12-14 MED ORDER — CARVEDILOL 25 MG PO TABS
25.0000 mg | ORAL_TABLET | Freq: Two times a day (BID) | ORAL | Status: DC
  Administered 2023-12-15: 25 mg via ORAL
  Filled 2023-12-14: qty 1

## 2023-12-14 MED ORDER — FUROSEMIDE 10 MG/ML IJ SOLN
40.0000 mg | Freq: Two times a day (BID) | INTRAMUSCULAR | Status: DC
  Administered 2023-12-15: 40 mg via INTRAVENOUS
  Filled 2023-12-14: qty 4

## 2023-12-14 MED ORDER — HYDRALAZINE HCL 50 MG PO TABS
100.0000 mg | ORAL_TABLET | Freq: Three times a day (TID) | ORAL | Status: DC
  Administered 2023-12-15 (×2): 100 mg via ORAL
  Filled 2023-12-14 (×2): qty 2

## 2023-12-14 MED ORDER — FUROSEMIDE 10 MG/ML IJ SOLN: 80.0000 mg | Freq: Two times a day (BID) | INTRAMUSCULAR | Status: DC

## 2023-12-14 MED ORDER — ONDANSETRON HCL 4 MG/2ML IJ SOLN
4.0000 mg | Freq: Once | INTRAMUSCULAR | Status: AC
  Administered 2023-12-14: 4 mg via INTRAVENOUS
  Filled 2023-12-14: qty 2

## 2023-12-14 MED ORDER — MORPHINE SULFATE (PF) 4 MG/ML IV SOLN
4.0000 mg | Freq: Once | INTRAVENOUS | Status: AC
  Administered 2023-12-14: 4 mg via INTRAVENOUS
  Filled 2023-12-14: qty 1

## 2023-12-14 MED ORDER — FUROSEMIDE 10 MG/ML IJ SOLN
40.0000 mg | Freq: Once | INTRAMUSCULAR | Status: AC
  Administered 2023-12-14: 40 mg via INTRAVENOUS
  Filled 2023-12-14: qty 4

## 2023-12-14 MED ORDER — SODIUM CHLORIDE 0.9 % IV SOLN: 250.0000 mL | INTRAVENOUS | Status: DC | PRN

## 2023-12-14 MED ORDER — EZETIMIBE 10 MG PO TABS
10.0000 mg | ORAL_TABLET | Freq: Every day | ORAL | Status: DC
  Administered 2023-12-15: 10 mg via ORAL
  Filled 2023-12-14: qty 1

## 2023-12-14 MED ORDER — HEPARIN SODIUM (PORCINE) 5000 UNIT/ML IJ SOLN
5000.0000 [IU] | Freq: Three times a day (TID) | INTRAMUSCULAR | Status: DC
  Administered 2023-12-14 – 2023-12-15 (×2): 5000 [IU] via SUBCUTANEOUS
  Filled 2023-12-14 (×2): qty 1

## 2023-12-14 MED ORDER — HEPARIN SODIUM (PORCINE) 5000 UNIT/ML IJ SOLN: 4000.0000 [IU] | Freq: Once | INTRAMUSCULAR | Status: DC

## 2023-12-14 MED ORDER — ASPIRIN 81 MG PO TBEC
81.0000 mg | DELAYED_RELEASE_TABLET | Freq: Every day | ORAL | Status: DC
  Administered 2023-12-15: 81 mg via ORAL
  Filled 2023-12-14: qty 1

## 2023-12-14 MED ORDER — SODIUM CHLORIDE 0.9% FLUSH
3.0000 mL | Freq: Two times a day (BID) | INTRAVENOUS | Status: DC
  Administered 2023-12-14 – 2023-12-15 (×2): 3 mL via INTRAVENOUS

## 2023-12-14 MED ORDER — ASPIRIN 81 MG PO CHEW
324.0000 mg | CHEWABLE_TABLET | Freq: Once | ORAL | Status: AC
  Administered 2023-12-14: 324 mg via ORAL
  Filled 2023-12-14: qty 4

## 2023-12-14 NOTE — ED Triage Notes (Signed)
 C/o shortness of breath since yesterday. Denies chest pain. Hx of hypertension.

## 2023-12-14 NOTE — H&P (Signed)
 Cardiology Admission History and Physical   Patient ID: Darius Rivers MRN: [id number]; DOB: May 29, 1961   Admission date: 12/14/2023  PCP:  Alejo Amsler, PA   [facility] Providers Cardiologist:  Jann Melody, MD  Cardiology APP:  Gabino Joe, PA-C       Chief Complaint:  Acute on chronic decompensated HF  Patient Profile:   Darius Rivers is a 63 y.o. male with NSTEMI 2022 CAD, HFrEF, felt NICM, moderate-severe AI, NSVT, ascending aorta dilation, HTN, HLD who is being seen 12/14/2023 for the evaluation of CHF.  History of Present Illness:   Darius Rivers note that he felt very short of breath on presentation to the hospital, unable to walk to the bathroom without symptoms. Started yesterday. Recived just one dose of lasix  and feels nearly back to baseline. No chest pain. Does have AR, was intended to have MRI/MRA as outpatient but did not present.  Significantly hypertensive without taking HF therapy today.    Past Medical History:  Diagnosis Date   Chronic kidney disease stage II    Coronary artery disease    NSTEMI in 11/2018 - Lg scar on Myoview ; Rx medically due to CKD // Cath 4/22: mLAD 15 (no sig CAD)   HFrEF (heart failure with reduced EF)    NICM // Echocardiogram 11/2018: EF 40-45 // Echocardiogram 4/22:  EF 25-30 // Echo 7/22: EF 40-45, global HK normal RVSF, mild LAE, mild MR, moderate AI, mild AV sclerosis without stenosis, mild dilation of aortic root (42 mm)   Hyperlipidemia    Hypertension    Lung nodule    CT 7/22: Left upper lobe 3 mm> repeat chest CT in 1 year   Nephrolithiasis    NSVT (nonsustained ventricular tachycardia)    Prostate cancer (HCC)    watchful waiting, due to go back to urology   Thoracic ascending aortic aneurysm    Echo 4/22: 44 mm // Chest CTA 7/22: Ascending thoracic aortic aneurysm 4.4 cm.  3 mm left upper lobe nodule.  Aortic atherosclerosis.    Past Surgical History:  Procedure Laterality  Date   CYSTOSCOPY WITH URETEROSCOPY, STONE BASKETRY AND STENT PLACEMENT     "put a stent in; pulled stent out later"   INGUINAL HERNIA REPAIR Bilateral 1980s   PROSTATE BIOPSY     RIGHT/LEFT HEART CATH AND CORONARY ANGIOGRAPHY N/A 02/06/2021   Procedure: RIGHT/LEFT HEART CATH AND CORONARY ANGIOGRAPHY;  Surgeon: Millicent Ally, MD;  Location: [facility] INVASIVE CV LAB;  Service: Cardiovascular;  Laterality: N/A;     Medications Prior to Admission: Prior to Admission medications   Medication Sig Start Date End Date Taking? Authorizing Provider  aspirin  EC 81 MG tablet Take 81 mg by mouth daily.   Yes [provider]  carvedilol  (COREG ) 25 MG tablet Take 1 tablet (25 mg total) by mouth 2 (two) times daily with a meal. 11/07/22  Yes Pemberton, Rumaldo Countess, MD  ezetimibe  (ZETIA ) 10 MG tablet Take 1 tablet (10 mg total) by mouth daily. 09/18/23  Yes Weaver, Scott T, PA-C  hydrALAZINE  (APRESOLINE ) 100 MG tablet Take 1 tablet (100 mg total) by mouth 3 (three) times daily. 09/30/22  Yes Sonny Dust, MD  isosorbide  mononitrate (IMDUR ) 30 MG 24 hr tablet Take 3 tablets (90 mg total) by mouth 2 (two) times daily. 02/04/23  Yes Sonny Dust, MD  rosuvastatin  (CRESTOR ) 40 MG tablet Take 1 tablet (40 mg total) by mouth daily at 6 PM. 12/08/22  Yes  Sonny Dust, MD  sacubitril -valsartan  (ENTRESTO ) 97-103 MG Take 1 tablet by mouth 2 (two) times daily. 09/15/23  Yes Weaver, Scott T, PA-C  spironolactone  (ALDACTONE ) 25 MG tablet Take 1 tablet (25 mg total) by mouth daily. 01/01/23  Yes Sonny Dust, MD     Allergies:   No Known Allergies  Social History:   Social History   Socioeconomic History   Marital status: Divorced    Spouse name: Not on file   Number of children: Not on file   Years of education: Not on file   Highest education level: Not on file  Occupational History   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[employer]    Comment: travels with athletic  teams to games  Tobacco Use   Smoking status: Former    Current packs/day: 0.00    Average packs/day: 0.3 packs/day for 20.0 years (6.6 ttl pk-yrs)    Types: Cigarettes    Start date: 11/11/1978    Quit date: 11/11/1998    Years since quitting: 25.1   Smokeless tobacco: Never  Vaping Use   Vaping status: Never Used  Substance and Sexual Activity   Alcohol use: Yes    Comment: 11/15/2018 "mixed drink q 2-3 weeks; if that"   Drug use: Yes    Types: Marijuana    Comment: occasional use   Sexual activity: Yes  Other Topics Concern   Not on file  Social History Narrative   Not on file   Social Drivers of Health   Financial Resource Strain: Not on file  Food Insecurity: Not on file  Transportation Needs: Not on file  Physical Activity: Not on file  Stress: Not on file  Social Connections: Not on file  Intimate Partner Violence: Not on file    Family History:   The patient's family history includes High blood pressure in his mother. There is no history of Heart attack, Cancer, or Diabetes.    ROS:  Please see the history of present illness.  All other ROS reviewed and negative.     Physical Exam/Data:   Vitals:   12/14/23 1740 12/14/23 1834 12/14/23 1838 12/14/23 1931  BP: (!) 181/138  (!) 162/117 (!) 182/116  Pulse: (!) 107  (!) 113 (!) 107  Resp: 12   18  Temp:   98.4 F (36.9 C) 98.4 F (36.9 C)  TempSrc:   Oral Oral  SpO2: 94%  100% 97%  Weight:  73.3 kg    Height:  6\' 4"  (1.93 m)     No intake or output data in the 24 hours ending 12/14/23 2231    12/14/2023    6:34 PM 12/14/2023    9:26 AM 12/04/2023    8:08 AM  Last 3 Weights  Weight (lbs) 161 lb 9.6 oz 180 lb 183 lb 6.4 oz  Weight (kg) 73.3 kg 81.647 kg 83.19 kg     Body mass index is 19.67 kg/m.  General:  Well nourished, well developed, in no acute distress HEENT: normal Neck: no JVD Vascular: No carotid bruits; Distal pulses 2+ bilaterally   Cardiac:  normal S1, S2; RRR; 2/6 diastolic murmur   Lungs:  clear to auscultation bilaterally, no wheezing, rhonchi or rales  Abd: soft, nontender, no hepatomegaly  Ext: no edema Musculoskeletal:  No deformities, BUE and BLE strength normal and equal Skin: warm and dry  Neuro:  CNs 2-12 intact, no focal abnormalities noted Psych:  Normal affect    EKG:  The ECG that was done 2/10 was personally reviewed and demonstrates LVH with IVCD and abnormal ST segments, SR  Relevant CV Studies: N/a  Laboratory Data:  High Sensitivity Troponin:   Recent Labs  Lab 12/14/23 0945 12/14/23 1213  TROPONINIHS 141* 167*      Chemistry Recent Labs  Lab 12/14/23 0945 12/14/23 2036  NA 140  --   K 4.3  --   CL 106  --   CO2 20*  --   GLUCOSE 118*  --   BUN 21*  --   CREATININE 1.26* 1.57*  CALCIUM  10.4*  --   GFRNONAA >60 50*  ANIONGAP 14  --     Recent Labs  Lab 12/14/23 0945  PROT 8.4*  ALBUMIN 4.5  AST 19  ALT 10  ALKPHOS 62  BILITOT 0.9   Lipids No results for input(s): "CHOL", "TRIG", "HDL", "LABVLDL", "LDLCALC", "CHOLHDL" in the last 168 hours. Hematology Recent Labs  Lab 12/14/23 0945 12/14/23 2036  WBC 8.7 12.4*  RBC 3.79* 3.87*  HGB 11.3* 11.6*  HCT 34.8* 35.3*  MCV 91.8 91.2  MCH 29.8 30.0  MCHC 32.5 32.9  RDW 16.0* 15.9*  PLT 385 383   Thyroid  No results for input(s): "TSH", "FREET4" in the last 168 hours. BNP Recent Labs  Lab 12/14/23 0945  BNP >4,500.0*    DDimer No results for input(s): "DDIMER" in the last 168 hours.   Radiology/Studies:  [facility] Chest Port 1 View Result Date: 12/14/2023 CLINICAL DATA:  Chest pain.  History of CHF. EXAM: PORTABLE CHEST 1 VIEW COMPARISON:  Chest radiograph dated February 03, 2021. CT chest dated December 11, 2022. FINDINGS: Stable cardiomegaly. Aortic atherosclerosis. Central pulmonary vascular congestion with bilateral interstitial opacities. No sizable pleural effusion or pneumothorax. No acute osseous abnormality. IMPRESSION: Cardiomegaly with central pulmonary vascular  congestion and interstitial edema. Electronically Signed   By: Mannie Seek M.D.   On: 12/14/2023 10:59     Assessment and Plan:   Principal Problem:   Heart failure (HCC) Active Problems:   CHF (congestive heart failure) (HCC)  - continue 2 more doses of IV lasix , 40 mg BID. Likely transition to oral lasix  tomorrow. - resume home HF meds, BP severely elevated but fortunately no CP. - Recent echo shows reduced EF and mod-severe AI. Would continue with plan for outpatient MRI/MRA.  -Cr elevated, may be reaching euvolemia. I suspect patient may have gotten hypertensive, AI acutely worsened, and he had rapid sense of volume overload, with lasix  this is improving.   Risk Assessment/Risk Scores:       [place]  Heart Association (NYHA) Functional Class NYHA Class II    Code Status: Full Code  Severity of Illness: The appropriate patient status for this patient is OBSERVATION. Observation status is judged to be reasonable and necessary in order to provide the required intensity of service to ensure the patient's safety. The patient's presenting symptoms, physical exam findings, and initial radiographic and laboratory data in the context of their medical condition is felt to place them at decreased risk for further clinical deterioration. Furthermore, it is anticipated that the patient will be medically stable for discharge from the hospital within 2 midnights of admission.    For questions or updates, please contact [facility] Please consult [email or web address] for contact info under     Signed, Euell Herrlich, MD  12/14/2023 10:31 PM

## 2023-12-14 NOTE — ED Provider Notes (Addendum)
 [place] [facility] Provider Note   CSN: [id number] Arrival date & time: 12/14/23  7829     History  Chief Complaint  Patient presents with   Shortness of Breath    Darius Rivers is a 63 y.o. male.  64 yo M with a chief complaints of difficulty breathing.  This been going on since yesterday.  Has had a little bit of a cough.  Says this reminds him of when he had heart failure in the past.  Denies edema.  Denies chest pain or pressure.   Shortness of Breath      Home Medications Prior to Admission medications   Medication Sig Start Date End Date Taking? Authorizing Provider  aspirin  EC 81 MG tablet Take 81 mg by mouth daily.    [provider]  carvedilol  (COREG ) 25 MG tablet Take 1 tablet (25 mg total) by mouth 2 (two) times daily with a meal. 11/07/22   Pemberton, Rumaldo Countess, MD  ezetimibe  (ZETIA ) 10 MG tablet Take 1 tablet (10 mg total) by mouth daily. 09/18/23   Marlyse Single T, PA-C  hydrALAZINE  (APRESOLINE ) 100 MG tablet Take 1 tablet (100 mg total) by mouth 3 (three) times daily. 09/30/22   Sonny Dust, MD  isosorbide  mononitrate (IMDUR ) 30 MG 24 hr tablet Take 3 tablets (90 mg total) by mouth 2 (two) times daily. 02/04/23   Sonny Dust, MD  rosuvastatin  (CRESTOR ) 40 MG tablet Take 1 tablet (40 mg total) by mouth daily at 6 PM. 12/08/22   Sonny Dust, MD  sacubitril -valsartan  (ENTRESTO ) 97-103 MG Take 1 tablet by mouth 2 (two) times daily. 09/15/23   Marlyse Single T, PA-C  spironolactone  (ALDACTONE ) 25 MG tablet Take 1 tablet (25 mg total) by mouth daily. 01/01/23   Sonny Dust, MD      Allergies    Patient has no known allergies.    Review of Systems   Review of Systems  Respiratory:  Positive for shortness of breath.     Physical Exam Updated Vital Signs BP (!) 173/122   Pulse (!) 113   Temp 98.5 F (36.9 C)   Resp (!) 25   Ht 6' 4.5" (1.943 m)   Wt 81.6 kg   SpO2 94%   BMI  21.62 kg/m  Physical Exam Vitals and nursing note reviewed.  Constitutional:      Appearance: He is well-developed.  HENT:     Head: Normocephalic and atraumatic.  Eyes:     Pupils: Pupils are equal, round, and reactive to light.  Neck:     Vascular: JVD present.     Comments: JVD to mid neck Cardiovascular:     Rate and Rhythm: Normal rate and regular rhythm.     Heart sounds: No murmur heard.    No friction rub. No gallop.  Pulmonary:     Effort: No respiratory distress.     Breath sounds: Rales present. No wheezing.     Comments: Rales noted primarily in the bases. Abdominal:     General: There is no distension.     Tenderness: There is no abdominal tenderness. There is no guarding or rebound.  Musculoskeletal:        General: Normal range of motion.     Cervical back: Normal range of motion and neck supple.     Right lower leg: No edema.     Left lower leg: No edema.  Skin:    Coloration: Skin is  not pale.     Findings: No rash.  Neurological:     Mental Status: He is alert and oriented to person, place, and time.  Psychiatric:        Behavior: Behavior normal.     ED Results / Procedures / Treatments   Labs (all labs ordered are listed, but only abnormal results are displayed) Labs Reviewed  CBC WITH DIFFERENTIAL/PLATELET - Abnormal; Notable for the following components:      Result Value   RBC 3.79 (*)    Hemoglobin 11.3 (*)    HCT 34.8 (*)    RDW 16.0 (*)    All other components within normal limits  COMPREHENSIVE METABOLIC PANEL - Abnormal; Notable for the following components:   CO2 20 (*)    Glucose, Bld 118 (*)    BUN 21 (*)    Creatinine, Ser 1.26 (*)    Calcium  10.4 (*)    Total Protein 8.4 (*)    All other components within normal limits  BRAIN NATRIURETIC PEPTIDE - Abnormal; Notable for the following components:   B Natriuretic Peptide >4,500.0 (*)    All other components within normal limits  TROPONIN I (HIGH SENSITIVITY) - Abnormal;  Notable for the following components:   Troponin I (High Sensitivity) 141 (*)    All other components within normal limits  RESP PANEL BY RT-PCR (RSV, FLU A&B, COVID)  RVPGX2  I-STAT CG4 LACTIC ACID, ED    EKG None  Radiology DG Chest Port 1 View Result Date: 12/14/2023 CLINICAL DATA:  Chest pain.  History of CHF. EXAM: PORTABLE CHEST 1 VIEW COMPARISON:  Chest radiograph dated February 03, 2021. CT chest dated December 11, 2022. FINDINGS: Stable cardiomegaly. Aortic atherosclerosis. Central pulmonary vascular congestion with bilateral interstitial opacities. No sizable pleural effusion or pneumothorax. No acute osseous abnormality. IMPRESSION: Cardiomegaly with central pulmonary vascular congestion and interstitial edema. Electronically Signed   By: Mannie Seek M.D.   On: 12/14/2023 10:59    Procedures .Critical Care  Performed by: Albertus Hughs, DO Authorized by: Albertus Hughs, DO   Critical care provider statement:    Critical care time (minutes):  35   Critical care time was exclusive of:  Separately billable procedures and treating other patients   Critical care was time spent personally by me on the following activities:  Development of treatment plan with patient or surrogate, discussions with consultants, evaluation of patient's response to treatment, examination of patient, ordering and review of laboratory studies, ordering and review of radiographic studies, ordering and performing treatments and interventions, pulse oximetry, re-evaluation of patient's condition and review of old charts   Care discussed with: admitting provider       Medications Ordered in ED Medications  aspirin  chewable tablet 324 mg (324 mg Oral Given 12/14/23 0947)  morphine  (PF) 4 MG/ML injection 4 mg (4 mg Intravenous Given 12/14/23 1025)  ondansetron  (ZOFRAN ) injection 4 mg (4 mg Intravenous Given 12/14/23 1025)  furosemide  (LASIX ) injection 40 mg (40 mg Intravenous Given 12/14/23 1056)    ED Course/  Medical Decision Making/ A&P                                 Medical Decision Making Amount and/or Complexity of Data Reviewed Labs: ordered. Radiology: ordered.  Risk OTC drugs. Prescription drug management. Decision regarding hospitalization.   63 year old male with a chief complaints of difficulty breathing.  Going on since yesterday.  Patient  hypoxic into the low 80s.  Started on oxygen with some improvement.  He says this reminds him of when he had heart failure in the past.  He does have rales on exam.  Will obtain a chest x-ray blood work.  Chest x-ray on my independent interpretation with increased edema compared to baseline.  BNP is elevated.  No significant change to renal function.  Potassium is normal.  Troponin 140 I think likely secondary to heart failure.  I discussed the case with cardiology on-call, agrees with cardiology admission.  Temporary admission orders entered.  11:27 AM I updated the patient on the lab results and the plan.  The patient's daughter was in the room and she was being very rude.  Demanded to have everything we discussed with her and spelled out.  She was unable to calmly discuss this with me and then confronted the patients nurse with me in the room.  The family member was asked to leave campus if she was unable to not be disrespectful.    The patients results and plan were reviewed and discussed.   Any x-rays performed were independently reviewed by myself.   Differential diagnosis were considered with the presenting HPI.  Medications  aspirin  chewable tablet 324 mg (324 mg Oral Given 12/14/23 0947)  morphine  (PF) 4 MG/ML injection 4 mg (4 mg Intravenous Given 12/14/23 1025)  ondansetron  (ZOFRAN ) injection 4 mg (4 mg Intravenous Given 12/14/23 1025)  furosemide  (LASIX ) injection 40 mg (40 mg Intravenous Given 12/14/23 1056)    Vitals:   12/14/23 1000 12/14/23 1015 12/14/23 1030 12/14/23 1045  BP: (!) 193/128 (!) 205/153 (!) 177/123 (!) 173/122   Pulse: (!) 116 (!) 110 (!) 112 (!) 113  Resp: (!) 34 (!) 35 (!) 24 (!) 25  Temp:      SpO2: 91% 94% 95% 94%  Weight:      Height:        Final diagnoses:  Acute on chronic systolic congestive heart failure (HCC)    Admission/ observation were discussed with the admitting physician, patient and/or family and they are comfortable with the plan.          Final Clinical Impression(s) / ED Diagnoses Final diagnoses:  Acute on chronic systolic congestive heart failure [facility])    Rx / DC Orders ED Discharge Orders     None         Albertus Hughs, DO 12/14/23 1112    Albertus Hughs, DO 12/14/23 1129

## 2023-12-15 ENCOUNTER — Telehealth (HOSPITAL_COMMUNITY): Payer: Self-pay

## 2023-12-15 ENCOUNTER — Other Ambulatory Visit (HOSPITAL_COMMUNITY): Payer: Self-pay

## 2023-12-15 ENCOUNTER — Encounter (HOSPITAL_COMMUNITY): Payer: Self-pay | Admitting: Internal Medicine

## 2023-12-15 DIAGNOSIS — I472 Ventricular tachycardia, unspecified: Secondary | ICD-10-CM | POA: Diagnosis not present

## 2023-12-15 DIAGNOSIS — I5043 Acute on chronic combined systolic (congestive) and diastolic (congestive) heart failure: Secondary | ICD-10-CM | POA: Diagnosis not present

## 2023-12-15 DIAGNOSIS — I7121 Aneurysm of the ascending aorta, without rupture: Secondary | ICD-10-CM | POA: Diagnosis not present

## 2023-12-15 DIAGNOSIS — I2510 Atherosclerotic heart disease of native coronary artery without angina pectoris: Secondary | ICD-10-CM | POA: Diagnosis not present

## 2023-12-15 LAB — BASIC METABOLIC PANEL
Anion gap: 10 (ref 5–15)
BUN: 27 mg/dL — ABNORMAL HIGH (ref 6–20)
CO2: 21 mmol/L — ABNORMAL LOW (ref 22–32)
Calcium: 9.1 mg/dL (ref 8.9–10.3)
Chloride: 106 mmol/L (ref 98–111)
Creatinine, Ser: 1.39 mg/dL — ABNORMAL HIGH (ref 0.61–1.24)
GFR, Estimated: 58 mL/min — ABNORMAL LOW (ref >60)
Glucose, Bld: 116 mg/dL — ABNORMAL HIGH (ref 70–99)
Potassium: 3.9 mmol/L (ref 3.5–5.1)
Sodium: 137 mmol/L (ref 135–145)

## 2023-12-15 LAB — TROPONIN I (HIGH SENSITIVITY): Troponin I (High Sensitivity): 190 ng/L (ref <18)

## 2023-12-15 LAB — MAGNESIUM: Magnesium: 1.9 mg/dL (ref 1.7–2.4)

## 2023-12-15 MED ORDER — FUROSEMIDE 40 MG PO TABS
40.0000 mg | ORAL_TABLET | Freq: Every day | ORAL | 5 refills | Status: DC | PRN
  Filled 2023-12-15: qty 30, 30d supply, fill #0

## 2023-12-15 MED ORDER — MAGNESIUM SULFATE IN D5W 1-5 GM/100ML-% IV SOLN
1.0000 g | Freq: Once | INTRAVENOUS | Status: AC
  Administered 2023-12-15: 1 g via INTRAVENOUS
  Filled 2023-12-15: qty 100

## 2023-12-15 NOTE — Discharge Summary (Addendum)
Discharge Summary    Patient ID: Darius Rivers MRN: [id number]; DOB: 07-27-1961  Admit date: 12/14/2023 Discharge date: 12/15/2023  PCP:  Clide Dales, PA   [facility] Providers Cardiologist:  Christell Constant, MD  Cardiology APP:  Beatrice Lecher, PA-C    Discharge Diagnoses    Principal Problem:   Heart failure [facility]) Active Problems:   NSVT (nonsustained ventricular tachycardia) (HCC)   Essential hypertension   Thoracic ascending aortic aneurysm   CAD (coronary artery disease)   CHF (congestive heart failure) (HCC)    Diagnostic Studies/Procedures     _____________   History of Present Illness     Darius Rivers is a 63 y.o. male with a past medical history of CAD, moderate-severe AI, HFrEF, NICM, ascending aortic dilation, NSVT, HTN, HLD.   Patient previously had right heart catheterization in 02/2021 that showed 15% stenosis in mid LAD, otherwise no coronary artery disease.  EF was 25-30%, overall consistent with nonischemic cardiomyopathy.  Most recent echocardiogram from 09/2023 showed EF 35-40% with global hypokinesis, moderate LVH, grade 2 diastolic dysfunction, normal RV function, severe left atrial dilation, mild-moderate MR, severe AI, dilatation of the aortic root measuring 40 mm and dilatation of the ascending aorta measuring 45 mm.  Patient is currently pending outpatient cardiac MRI, chest/aorta MRA.  Patient presented to the ED on 2/10 complaining of shortness of breath that had started the day before.  Reported he was unable to walk to the bathroom without developing shortness of breath.  He had not taken his heart failure medications that day, and he was significantly hypertensive in the ED.  In the ED, lab work significant for high sensitivity troponin 141>167>190. BNP elevated to >4500. CXR with cardiomegaly, central pulmonary vascular congestion and interstitial edema. He was given IV lasix in the ED with significant improvement  in symptoms. Was admitted to the cardiology service for further diuresis.   Hospital Course     Consultants:   Acute on Chronic Combined Systolic and Diastolic Heart Failure  Nonischemic Cardiomyopathy  - Most recent echocardiogram from 09/2023 showed EF 35-40%, severe LV dilation, moderate LVH, grade II DD, normal RV function, mild-moderate MR, severe AI  - Patient presented on 2/10 complaining of shortness of breath that started the day before. At home, he was hypoxic with sats in the low 80s. He had not taken his HF medications, and was very hypertensive in the ED.  - In the ED, CXR showed cardiomegaly with central pulmonary vascular congestion and interstitial edema. BNP >4500, important to note that patient is on entresto  - Suspected that patient became hypertensive and had acute worsening of AI leading to rapid sense of volume overload  - Patient was treated with IV lasix. I/Os were not well recorded, but patient reported significant improvement in symptoms. Euvolemic on exam prior to DC on 2/11 - Continue carvedilol 25 mg BID, hydralazine 100 mg TID, imdur 90 mg BID, entresto 97/103 mg BID, spironolactone 25 mg daily  - Sent patient home with lasix 40 mg PRN for weight gain, ankle swelling   Elevated Troponin  - hsTn 130>865>784 - Patient chest pain free. Most recent ischemic evaluation was a cath in 02/2021 that had minimal, nonobstructive CAD  - Trend not consistent with ACS. Suspect demand ischemia due to CHF exacerbation   Severe AI  - Noted on echocardiogram from 09/2023 - Pending outpatient cardiac MRI to further assess aortic valve- scheduled for 12/24/23  CAD  - Previously  underwent  L/R heart cath in 02/2021 that showed 15% stenosis in mid LAD, otherwise no significant CAD  - No chest pain during this admission  - Continue ASA 81 mg daily, zetia 10 mg daily, imdur 90 mg daily, crestor 40 mg daily, carvedilol 25 mg BID   NSVT  - Patient had a 12 beat run of NSVT the AM of  2/11. He was asymptomatic. Since then, telemetry showed NSR with rare PVCs - K 3.9, mag 1.9. Was given mag supplementation prior to DC  - Continue carvedilol 25 mg BID   Ascending Aortic dilation  - Echo from 09/2023 noted dilatation of the aortic root measuring 40 mm, dilatation of the ascending aorta measuring 45 mm - Pending outpatient cMRI and MRA chest   HTN  - On arrival to the ED, patient was hypertensive. Had not taken his HF medications  - Home BP/HF medications were resumed and BP improved. 127/79 prior to DC  - Continue carvedilol 25 mg BID, hydralazine 100 mg TID, imdur 90 mg BID, entresto 97/103 mg BID, spironolactone 25 mg Daily   HLD  - Lipid panel from 11/2023 showed LDL 47, HDL 33, triglycerides 40, total cholesterol 91  - Continue crestor 40 mg daily, zetia 10 mg daily  Patient was seen and examined by Dr. Jacques Navy and was deemed stable for discharge. He has follow up with the heart failure TOC on 12/23/23. Has Gen Cards follow up 3/4   Did the patient have an acute coronary syndrome (MI, NSTEMI, STEMI, etc) this admission?:  No                               Did the patient have a percutaneous coronary intervention (stent / angioplasty)?:  No.       _____________  Discharge Vitals Blood pressure 127/79, pulse 85, temperature 97.6 F (36.4 C), temperature source Oral, resp. rate 18, height 6\' 4"  (1.93 m), weight 72.8 kg, SpO2 99%.  Filed Weights   12/14/23 0926 12/14/23 1834 12/15/23 0423  Weight: 81.6 kg 73.3 kg 72.8 kg    Labs & Radiologic Studies    CBC Recent Labs    12/14/23 0945 12/14/23 2036  WBC 8.7 12.4*  NEUTROABS 7.6  --   HGB 11.3* 11.6*  HCT 34.8* 35.3*  MCV 91.8 91.2  PLT 385 383   Basic Metabolic Panel Recent Labs    82/95/62 0945 12/14/23 2036 12/15/23 0259  NA 140  --  137  K 4.3  --  3.9  CL 106  --  106  CO2 20*  --  21*  GLUCOSE 118*  --  116*  BUN 21*  --  27*  CREATININE 1.26* 1.57* 1.39*  CALCIUM 10.4*  --  9.1  MG  --    --  1.9   Liver Function Tests Recent Labs    12/14/23 0945  AST 19  ALT 10  ALKPHOS 62  BILITOT 0.9  PROT 8.4*  ALBUMIN 4.5   No results for input(s): "LIPASE", "AMYLASE" in the last 72 hours. High Sensitivity Troponin:   Recent Labs  Lab 12/14/23 0945 12/14/23 1213 12/15/23 0731  TROPONINIHS 141* 167* 190*    BNP Invalid input(s): "POCBNP" D-Dimer No results for input(s): "DDIMER" in the last 72 hours. Hemoglobin A1C No results for input(s): "HGBA1C" in the last 72 hours. Fasting Lipid Panel No results for input(s): "CHOL", "HDL", "LDLCALC", "TRIG", "CHOLHDL", "LDLDIRECT" in the last 72 hours.  Thyroid Function Tests No results for input(s): "TSH", "T4TOTAL", "T3FREE", "THYROIDAB" in the last 72 hours.  Invalid input(s): "FREET3" _____________  [facility] Chest Port 1 View Result Date: 12/14/2023 CLINICAL DATA:  Chest pain.  History of CHF. EXAM: PORTABLE CHEST 1 VIEW COMPARISON:  Chest radiograph dated February 03, 2021. CT chest dated December 11, 2022. FINDINGS: Stable cardiomegaly. Aortic atherosclerosis. Central pulmonary vascular congestion with bilateral interstitial opacities. No sizable pleural effusion or pneumothorax. No acute osseous abnormality. IMPRESSION: Cardiomegaly with central pulmonary vascular congestion and interstitial edema. Electronically Signed   By: Hart Robinsons M.D.   On: 12/14/2023 10:59   Disposition   Pt is being discharged home today in good condition.  Follow-up Plans & Appointments     Follow-up Information     [facility]. Go in 6 day(s).   Specialty: Cardiology Why: Hospital follow-up 12/23/2023 @ 9 am Please bring a current medication list to appointment FREE valet parking, Entrance C, off [employer] information: [address] [place] [address] [phone]               Discharge Instructions     (HEART FAILURE PATIENTS) Call MD:  Anytime you  have any of the following symptoms: 1) 3 pound weight gain in 24 hours or 5 pounds in 1 week 2) shortness of breath, with or without a dry hacking cough 3) swelling in the hands, feet or stomach 4) if you have to sleep on extra pillows at night in order to breathe.   Complete by: As directed    Call MD for:  difficulty breathing, headache or visual disturbances   Complete by: As directed    Call MD for:  extreme fatigue   Complete by: As directed    Call MD for:  persistant dizziness or light-headedness   Complete by: As directed    Call MD for:  temperature >100.4   Complete by: As directed    Diet - low sodium heart healthy   Complete by: As directed    Increase activity slowly   Complete by: As directed         Discharge Medications   Allergies as of 12/15/2023   No Known Allergies      Medication List     TAKE these medications    aspirin EC 81 MG tablet Take 81 mg by mouth daily.   carvedilol 25 MG tablet Commonly known as: COREG Take 1 tablet (25 mg total) by mouth 2 (two) times daily with a meal.   Entresto 97-103 MG Generic drug: sacubitril-valsartan Take 1 tablet by mouth 2 (two) times daily.   ezetimibe 10 MG tablet Commonly known as: ZETIA Take 1 tablet (10 mg total) by mouth daily.   furosemide 40 MG tablet Commonly known as: Lasix Take 1 tablet (40 mg total) by mouth daily as needed for fluid or edema (weight gain >3 lbs in one day or >5 lbs in one week).   hydrALAZINE 100 MG tablet Commonly known as: APRESOLINE Take 1 tablet (100 mg total) by mouth 3 (three) times daily.   isosorbide mononitrate 30 MG 24 hr tablet Commonly known as: IMDUR Take 3 tablets (90 mg total) by mouth 2 (two) times daily.   rosuvastatin 40 MG tablet Commonly known as: CRESTOR Take 1 tablet (40 mg total) by mouth daily at 6 PM.   spironolactone 25 MG tablet Commonly known as: ALDACTONE Take 1 tablet (25 mg total) by  mouth daily.           Outstanding  Labs/Studies    Duration of Discharge Encounter: APP Time: 20 minutes   Signed, Jonita Albee, PA-C 12/15/2023, 12:03 PM     Patient Name: Darius Rivers Date of Encounter: 12/15/2023 [facility] Cardiologist: Christell Constant, MD   Interval Summary  .    Patient feels well and is ready to go home  Vital Signs .    Vitals:   12/15/23 0045 12/15/23 0423 12/15/23 0710 12/15/23 1000  BP: (!) 160/120 (!) 167/110 122/83 127/79  Pulse: (!) 102 (!) 102 69 85  Resp: 20 20 18    Temp: 98.1 F (36.7 C) 98.7 F (37.1 C) 97.6 F (36.4 C)   TempSrc: Oral Oral Oral   SpO2: 93% 100% 99% 99%  Weight:  72.8 kg    Height:        Intake/Output Summary (Last 24 hours) at 12/15/2023 1244 Last data filed at 12/15/2023 1139 Gross per 24 hour  Intake 600 ml  Output 1600 ml  Net -1000 ml      12/15/2023    4:23 AM 12/14/2023    6:34 PM 12/14/2023    9:26 AM  Last 3 Weights  Weight (lbs) 160 lb 9.6 oz 161 lb 9.6 oz 180 lb  Weight (kg) 72.848 kg 73.3 kg 81.647 kg      Telemetry/ECG    Sinus rhythm, short runs of nonsustained VT no more than 10 beats- Personally Reviewed  Physical Exam .   GEN: No acute distress.   Neck: No JVD Cardiac: RRR, 2/6 diastolic murmur left sternal border Respiratory: Clear to auscultation bilaterally. GI: Soft, nontender, non-distended  MS: No edema  Assessment & Plan .     Principal Problem:   Heart failure (HCC) Active Problems:   NSVT (nonsustained ventricular tachycardia) (HCC)   Essential hypertension   Thoracic ascending aortic aneurysm   CAD (coronary artery disease)   CHF (congestive heart failure) (HCC)  -Patient felt symptomatically better after single dose of IV Lasix, and received a total of 3 doses with resolution of symptoms.  Suspect he had flash rise in LVEDP in the setting of moderate severe aortic valve regurgitation with probable hypertensive episode.  This is a best explanation for his rapid improvement  and rapid onset of symptoms.  He has been continued on his heart failure therapy and has a TOC heart failure appointment 12/23/2023.  Recommend continuing with plan for outpatient MRI MRA to evaluate LV function and assess aortic valve regurgitation severity.  Creatinine stable with diuresis.  Given brief runs of NSVT I will supplement has been magnesium today 1 g.  After which he is stable for hospital discharge with as needed Lasix 40 mg for swelling or shortness of breath.  This can be adjusted at [facility] follow-up as needed.  Total time of encounter: 35 minutes total time of encounter, including 15 minutes spent in face-to-face patient care on the date of this encounter. This time includes coordination of care and counseling regarding above mentioned problem list. Remainder of non-face-to-face time involved reviewing chart documents/testing relevant to the patient encounter and documentation in the medical record. I have independently reviewed documentation from referring provider.   Weston Brass, MD, [facility]  [facility]    For questions or updates, please contact [facility] Please consult [email or web address] for contact info under        Signed, Parke Poisson, MD

## 2023-12-15 NOTE — Progress Notes (Signed)
RN notified by CCMD of patients 12 bt run of VT, and notified by lab of critical trop 190. Patient asymptomatic. Lynda Rainwater, MD and [place], [place] notified. No new orders, en route to bedside. Will continue to monitor.

## 2023-12-15 NOTE — Progress Notes (Signed)
IV mag completed, IVs removed with no complications. Ride home at bedside.

## 2023-12-15 NOTE — Telephone Encounter (Signed)
Patient Advocate Encounter  Test billing for SGLT2i returned the following results:  Darius Rivers requires prior authorization Jardiance limit 30 days, copay $205.39.  This patient is eligible to use a copay savings card that would cover up to $175, resulting in a $30.39 monthly copay.  Burnell Blanks, CPhT Rx Patient Advocate Phone: [phone]

## 2023-12-15 NOTE — Progress Notes (Signed)
AVS reviewed with patient and his daughter, all questions answered. Waiting for discharge for magnesium sulfate infusion to complete prior to discharge, per [place], [place]. Patient set to DC home with daughter for transport.

## 2023-12-15 NOTE — Plan of Care (Signed)
Pt does not complain of any pain.

## 2023-12-15 NOTE — TOC Initial Note (Signed)
Transition of Care [facility]) - Initial/Assessment Note    Patient Details  Name: Darius Rivers MRN: [id number] Date of Birth: July 20, 1961  Transition of Care [facility]) CM/SW Contact:    Leone Haven, RN Phone Number: 12/15/2023, 12:43 PM  Clinical Narrative:                 From home alone,, has PCP and insurance on file, states has no HH services in place at this time or DME at home.  States daughter will transport them home at [employer] and family is support system, states gets medications from [facility] on [address].  Pta self ambulatory .  Expected Discharge Plan: Home/Self Care Barriers to Discharge: No Barriers Identified   Patient Goals and CMS Choice Patient states their goals for this hospitalization and ongoing recovery are:: return home   Choice offered to / list presented to : NA      Expected Discharge Plan and Services In-house Referral: NA Discharge Planning Services: CM Consult Post Acute Care Choice: NA Living arrangements for the past 2 months: Single Family Home Expected Discharge Date: 12/15/23               DME Arranged: N/A DME Agency: NA       HH Arranged: NA          Prior Living Arrangements/Services Living arrangements for the past 2 months: Single Family Home Lives with:: Self Patient language and need for interpreter reviewed:: Yes Do you feel safe going back to the place where you live?: Yes      Need for Family Participation in Patient Care: No (Comment) Care giver support system in place?: No (comment)   Criminal Activity/Legal Involvement Pertinent to Current Situation/Hospitalization: No - Comment as needed  Activities of Daily Living   ADL Screening (condition at time of admission) Independently performs ADLs?: Yes (appropriate for developmental age) Is the patient deaf or have difficulty hearing?: No Does the patient have difficulty seeing, even when wearing glasses/contacts?: No Does the patient have difficulty  concentrating, remembering, or making decisions?: No  Permission Sought/Granted Permission sought to share information with : Case Manager Permission granted to share information with : Yes, Verbal Permission Granted              Emotional Assessment Appearance:: Appears stated age Attitude/Demeanor/Rapport: Engaged Affect (typically observed): Appropriate Orientation: : Oriented to Self, Oriented to Place, Oriented to  Time, Oriented to Situation   Psych Involvement: No (comment)  Admission diagnosis:  Acute on chronic systolic congestive heart failure (HCC) [I50.23] Heart failure (HCC) [I50.9] CHF (congestive heart failure) (HCC) [I50.9] Patient Active Problem List   Diagnosis Date Noted   Heart failure (HCC) 12/14/2023   CHF (congestive heart failure) (HCC) 12/14/2023   CAD (coronary artery disease) 12/03/2023   Moderate aortic insufficiency 11/25/2022   Thoracic ascending aortic aneurysm 05/24/2021   Lung nodule 05/24/2021   Essential hypertension 02/04/2021   AAA (abdominal aortic aneurysm) (HCC) 02/04/2021   H/O prostate cancer 02/04/2021   Prolonged QT interval 02/03/2021   NSVT (nonsustained ventricular tachycardia) (HCC) 11/14/2018   AKI (acute kidney injury) (HCC) 11/11/2018   Troponin I above reference range 11/11/2018   Hypotension 11/11/2018   Dizziness 11/11/2018   Hyperlipidemia 11/11/2018   HFrEF (heart failure with reduced ejection fraction) (HCC) 11/11/2018   CKD (chronic kidney disease), stage III (HCC) 11/11/2018   Hyponatremia 11/11/2018   Hypokalemia 11/11/2018   Lactic acid increased 11/11/2018   Bilateral nephrolithiasis 11/11/2018   PCP:  Clide Dales, [place] Pharmacy:   [id number] - [place] - [address] [place] [address] Phone: [phone] Fax: [phone]  [facility] [address] [place] [address] Phone: [phone] Fax:  [phone]     Social Drivers of Health (SDOH) Social History: SDOH Screenings   Food Insecurity: No Food Insecurity (12/14/2023)  Housing: Low Risk  (12/15/2023)  Transportation Needs: No Transportation Needs (12/14/2023)  Utilities: Not At Risk (12/14/2023)  Alcohol Screen: Low Risk  (12/15/2023)  Financial Resource Strain: Low Risk  (12/15/2023)  Tobacco Use: Medium Risk (12/14/2023)   SDOH Interventions: Housing Interventions: Intervention Not Indicated Alcohol Usage Interventions: Intervention Not Indicated (Score <7) Financial Strain Interventions: Intervention Not Indicated   Readmission Risk Interventions     No data to display

## 2023-12-15 NOTE — Progress Notes (Signed)
Heart Failure Nurse Navigator Progress Note  PCP: Clide Dales, PA PCP-Cardiologist: Ronette Deter Admission Diagnosis: Acute on Chronic congestive heart Failure Admitted from: Home  Presentation:   Darius Rivers presented with shortness of breath, x 1 day, a slight cough, hypoxic in the 80's. Denies chest pain.  BP 173/122, HR 113, BNP > 4,500. CXR with cardiomegaly with central pulmonary vascular congestion and interstitial edema. Plan for outpatient MRI/MRA.   Patient was educated on the sign and symptoms of heart failure, daily weights, when to call his doctor or go to the ED. Diet/ fluid restrictions , taking all medications as prescribed, and attending all medical appointments. Patient verbalized his understanding. A HF TOC appointment was scheduled for 12/23/2023 @ 9 am.   ECHO/ LVEF: 35-40%  Clinical Course:  Past Medical History:  Diagnosis Date   Chronic kidney disease stage II    Coronary artery disease    NSTEMI in 11/2018 - Lg scar on Myoview; Rx medically due to CKD // Cath 4/22: mLAD 15 (no sig CAD)   HFrEF (heart failure with reduced EF)    NICM // Echocardiogram 11/2018: EF 40-45 // Echocardiogram 4/22:  EF 25-30 // Echo 7/22: EF 40-45, global HK normal RVSF, mild LAE, mild MR, moderate AI, mild AV sclerosis without stenosis, mild dilation of aortic root (42 mm)   Hyperlipidemia    Hypertension    Lung nodule    CT 7/22: Left upper lobe 3 mm> repeat chest CT in 1 year   Nephrolithiasis    NSVT (nonsustained ventricular tachycardia)    Prostate cancer (HCC)    watchful waiting, due to go back to urology   Thoracic ascending aortic aneurysm    Echo 4/22: 44 mm // Chest CTA 7/22: Ascending thoracic aortic aneurysm 4.4 cm.  3 mm left upper lobe nodule.  Aortic atherosclerosis.     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  Tobacco Use   Smoking status: Former    Current packs/day: 0.00    Average packs/day: 0.3 packs/day for 20.0 years (6.6 ttl pk-yrs)    Types: Cigarettes    Start date: 11/11/1978    Quit date: 11/11/1998    Years since quitting: 25.1   Smokeless tobacco: Never  Vaping Use   Vaping status: Never Used  Substance and Sexual Activity   Alcohol use: Yes    Comment: 11/15/2018 "mixed drink q 2-3 weeks; if that"   Drug use: Yes    Types: Marijuana    Comment: occasional use   Sexual activity: Yes  Other Topics Concern   Not on file  Social History Narrative   Not on file   Social Drivers of Health   Financial Resource Strain: Not on file  Food Insecurity: No Food Insecurity (12/14/2023)   Hunger Vital Sign    Worried About Running Out of Food in the Last Year: Never true    Ran Out of Food in the Last Year: Never true  Transportation Needs: No Transportation Needs (12/14/2023)   PRAPARE - Administrator, Civil Service (Medical): No    Lack of Transportation (Non-Medical): No  Physical Activity:  Not on file  Stress: Not on file  Social Connections: Not on file   Education Assessment and Provision:  Detailed education and instructions provided on heart failure disease management including the following:  Signs and symptoms of Heart Failure When to call the physician Importance of daily weights Low sodium diet Fluid restriction Medication management Anticipated future follow-up appointments  Patient education given on each of the above topics.  Patient acknowledges understanding via teach back method and acceptance of all instructions.  Education Materials:  "Living Better With Heart Failure" Booklet, HF zone tool, & Daily Weight Tracker Tool.  Patient has scale at home: yes Patient has pill box at  home: Yes    High Risk Criteria for Readmission and/or Poor Patient Outcomes: Heart failure hospital admissions (last 6 months): 1  No Show rate: 10 %  Difficult social situation: No, lives alone Demonstrates medication adherence: Yes Primary Language: English Literacy level: Reading, writing, and comprehension  Barriers of Care:   Diet/ fluid restrictions ( soda/salt) Daily weights  Considerations/Referrals:   Referral made to Heart Failure Pharmacist Stewardship: yes Referral made to Heart Failure CSW/NCM TOC: No Referral made to Heart & Vascular TOC clinic: Yes, 12/23/2023 @ 9 am.   Items for Follow-up on DC/TOC: Continued HF education Diet/ fluid restrictions ( salt/ soda) Daily weights   Rhae Hammock, BSN, RN Heart Failure Teacher, adult education Only

## 2023-12-15 NOTE — Progress Notes (Signed)
Heart Failure Stewardship Pharmacist Progress Note   PCP: Clide Dales, PA PCP-Cardiologist: Christell Constant, MD    HPI:  Darius Rivers is a 72 YOM with PMH CAD (NSTEMI 2022), HFrEF d/t NICM, moderate-severe AI, NSVT, HTN, and HLD. On 02/2021 LHC during NSTEMI showed no significant coronary obstructions, EF was estimated to be 25-30%.  Arrived at [facility] ED on 12/14/23 with complaints of worsening SOB that started the previous day. He denies worsening edema, chest pain, or palpitations. Vitals showed BP 173/122 mmHg, HR 113 bpm, afebrile, SpO2 down to low ~80% on RA. Started on supplemental oxygen. Physical exam showed JVD to mid neck, rales present in bases bilaterally, soft abdomen, no LEE. Labs obtained BNP >4500, flat troponins, Scr 1.26, BUN 21. CXR showed increased bilateral pulmonary edema. EKG SR with abnormal ST segments. He was given furosemide 40 mg IV x1.  Recent ECHO (09/2023) showed LVEF 35-40%, LV global hypokinesis with diastolic dysfunction, RV normal, severe AV regurgitation. Patient was scheduled to undergo cardiac MRI 11/25/23 but did not arrive to have this completed. He has been rescheduled for CMRI 12/23/23.  Today patient is feeling much improved with no complaints of SOB. He reports being able to walk around his room with no breathing issues. He denies dizziness, lightheadedness, chest pain, and palpitations. He has no LEE on physical exam. His appetite is okay. He states that he would like to get any eligible refills or first fills at discharge through [facility] pharmacy and he would like refills to go through [facility] local [facility].  Patient was previously on Jardiance 10 mg daily and tolerated this therapy well. Reason for discontinuation was due to cost.  A cost check for SGLT2I therapy today showed Jardiance co-pay of $205/month ($30/month with co-pay card).  Discharge HF Medications: Diuretic: furosemide 40 mg PO PRN Beta Blocker: carvedilol 25 mg PO  BID ACE/ARB/ARNI: Entresto 97/103 mg PO BID MRA: spironolactone 25 mg daily Other: hydralazine 100 mg PO TID, isosorbide mononitrate ER 90 mg PO BID  Prior to admission HF Medications: Beta blocker: carvedilol 25 mg PO BID ACE/ARB/ARNI: Entresto 97/103 mg PO BID MRA: spironolactone 25 mg PO daily Other: hydralazine 100 mg PO TID, isosorbide mononitrate ER 90 mg BID  Pertinent Lab Values: Serum creatinine 1.39, BUN 27, Potassium 3.9, Sodium 137, Magnesium 1.9 2/10: BNP >4500  Vital Signs: Weight: 160 lbs (admission weight: 160 lbs) Blood pressure: ~120/80 mmHg  Heart rate: 70-80 bpm  I/O: net -0.4L yesterday; net -0.1L since admission  Medication Assistance / Insurance Benefits Check: Does the patient have prescription insurance?  Yes Type of insurance plan: Commercial  Does the patient qualify for medication assistance through manufacturers or grants?   No  Outpatient Pharmacy:  Prior to admission [facility]: [facility], [place] Is the patient willing to use [facility] TOC pharmacy at discharge? Yes Is the patient willing to transition their outpatient pharmacy to utilize a [facility] outpatient pharmacy?   No    Assessment: 1. Acute on chronic systolic CHF (LVEF 35-40%), due to NICM. NYHA class I-II symptoms. - Patient appears euvolemic after 2x doses of IV furosemide, he states he has no SOB when walking around his room and he is able to lie flat in his bed. - Patients BP and HR improved and renal function appears stable with small bump in scr (1.26 >1.39) after resumption of GDMT regimen that he was taking prior to admission.   Plan: 1) Medication changes recommended at this  time: - Continue GDMT (carvedilol, Entresto, spironolactone, hydralazine, and isosorbide monontirate) at discharge - Agree with plan to discharge with PRN furosemide for outpatient diuresis  2) Patient assistance: - Jardiance co-pay of $205/month ($30/month with co-pay card)  - Marcelline Deist  requires prior authorization.  3)  Education  - Final education completed 12/15/23 - Patient has been educated on current HF medications and potential additions to HF medication regimen - Patient verbalizes understanding that over the next few months, these medication doses may change and more medications may be added to optimize HF regimen - Patient has been educated on basic disease state pathophysiology and goals of therapy   Wilmer Floor, PharmD PGY2 Cardiology Pharmacy Resident Heart Failure Stewardship Phone [phone]

## 2023-12-15 NOTE — TOC Transition Note (Addendum)
Transition of Care [facility]) - Discharge Note   Patient Details  Name: Yanixan Mellinger MRN: [id number] Date of Birth: [birth year]  Transition of Care [facility]) CM/SW Contact:  Leone Haven, RN Phone Number: 12/15/2023, 12:44 PM   Clinical Narrative:    For dc home today, daughter at bedside to transport home,  Mag has to infuse prior to dc. TOC pharmacy to fill meds.   Final next level of care: Home/Self Care Barriers to Discharge: No Barriers Identified   Patient Goals and CMS Choice Patient states their goals for this hospitalization and ongoing recovery are:: return home   Choice offered to / list presented to : NA      Discharge Placement                       Discharge Plan and Services Additional resources added to the After Visit Summary for   In-house Referral: NA Discharge Planning Services: CM Consult Post Acute Care Choice: NA          DME Arranged: N/A DME Agency: NA       HH Arranged: NA          Social Drivers of Health (SDOH) Interventions SDOH Screenings   Food Insecurity: No Food Insecurity (12/14/2023)  Housing: Low Risk  (12/15/2023)  Transportation Needs: No Transportation Needs (12/14/2023)  Utilities: Not At Risk (12/14/2023)  Alcohol Screen: Low Risk  (12/15/2023)  Financial Resource Strain: Low Risk  (12/15/2023)  Tobacco Use: Medium Risk (12/14/2023)     Readmission Risk Interventions     No data to display

## 2023-12-21 NOTE — Progress Notes (Signed)
HEART & VASCULAR TRANSITION OF CARE CONSULT NOTE    Referring Physician: PCP: Clide Dales, PA Cardiologist: Christell Constant, MD   Chief Complaint: Heart failure follow up  HPI: Referred to clinic by Dr. Jacques Navy for heart failure consultation.   Darius Rivers is a 63 y.o. male with HFrEF, NiCM, CAD s/p NSTEMI 20', mod-severe AI, NSVT, HTN and HLD.   LHC 4/22: 15% stenosis in mid LAD, otherwise no coronary artery disease.   Admitted 2/25 with SOB 2/2 volume overload and uncontrolled HTN. BNP was >4.5K. CXR with pulmonary congestion. Diuresed well with IV lasix. Started back on home antihypertensives and was euvolemic at discharge.  Today he presents for AHF/TOC clinic visit. Overall feeling great. Denies palpitations, CP, dizziness, or PND/Orthopnea. Occasional edema. No SOB. Appetite ok. No fever or chills. Weight at home 160 pounds. Taking all medications. Works at [employer] in [employer]. Has only taken lasix twice since discharged. Lives alone. Mom had CHF, has 2 siblings with HTN. SBP at home 130s. Drinks occasionally. Stopped smoking cigarettes several years ago. Occasionally smokes marijuana.   Cardiac Testing   Echo 11/24 EF 35-40%, GHK, mod LVH, GIDD, normal RV, severe LA dilatation, mild-mod MR, severe AI, aortic root dilatation LHC 4/22: 15% stenosis in mid LAD, otherwise no coronary artery disease.   Past Medical History:  Diagnosis Date   Chronic kidney disease stage II    Coronary artery disease    NSTEMI in 11/2018 - Lg scar on Myoview; Rx medically due to CKD // Cath 4/22: mLAD 15 (no sig CAD)   HFrEF (heart failure with reduced EF)    NICM // Echocardiogram 11/2018: EF 40-45 // Echocardiogram 4/22:  EF 25-30 // Echo 7/22: EF 40-45, global HK normal RVSF, mild LAE, mild MR, moderate AI, mild AV sclerosis without stenosis, mild dilation of aortic root (42 mm)   Hyperlipidemia    Hypertension    Lung nodule    CT 7/22: Left upper lobe 3 mm>  repeat chest CT in 1 year   Nephrolithiasis    NSVT (nonsustained ventricular tachycardia)    Prostate cancer (HCC)    watchful waiting, due to go back to urology   Thoracic ascending aortic aneurysm    Echo 4/22: 44 mm // Chest CTA 7/22: Ascending thoracic aortic aneurysm 4.4 cm.  3 mm left upper lobe nodule.  Aortic atherosclerosis.    Current Outpatient Medications  Medication Sig Dispense Refill   aspirin EC 81 MG tablet Take 81 mg by mouth daily.     carvedilol (COREG) 25 MG tablet Take 1 tablet (25 mg total) by mouth 2 (two) times daily with a meal. 180 tablet 3   empagliflozin (JARDIANCE) 10 MG TABS tablet Take 1 tablet (10 mg total) by mouth daily before breakfast. 30 tablet 5   furosemide (LASIX) 40 MG tablet Take 1 tablet (40 mg total) by mouth daily as needed for fluid or edema (weight gain >3 lbs in one day or >5 lbs in one week). 30 tablet 5   hydrALAZINE (APRESOLINE) 100 MG tablet Take 1 tablet (100 mg total) by mouth 3 (three) times daily. 270 tablet 3   isosorbide mononitrate (IMDUR) 30 MG 24 hr tablet Take 3 tablets (90 mg total) by mouth 2 (two) times daily. 180 tablet 9   rosuvastatin (CRESTOR) 40 MG tablet Take 1 tablet (40 mg total) by mouth daily at 6 PM. 90 tablet 3   spironolactone (ALDACTONE) 25 MG tablet Take 1 tablet (25  mg total) by mouth daily. 90 tablet 3   ezetimibe (ZETIA) 10 MG tablet Take 1 tablet (10 mg total) by mouth daily. 90 tablet 0   sacubitril-valsartan (ENTRESTO) 97-103 MG Take 1 tablet by mouth 2 (two) times daily. 180 tablet 0   No current facility-administered medications for this encounter.    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  Tobacco Use   Smoking status: Former    Current packs/day: 0.00    Average packs/day: 0.3 packs/day for 20.0 years (6.6 ttl pk-yrs)    Types: Cigarettes    Start date: 11/11/1978    Quit date: 11/11/1998    Years since quitting: 25.1   Smokeless tobacco: Never  Vaping Use   Vaping status: Never Used  Substance and Sexual Activity   Alcohol use: Not Currently   Drug use: Yes    Types: Marijuana    Comment: 2 x per month   Sexual activity: Yes  Other Topics Concern   Not on file  Social History Narrative   Not on file   Social Drivers of Health   Financial Resource Strain: Low Risk  (12/15/2023)   Overall Financial Resource Strain (CARDIA)    Difficulty of Paying Living Expenses: Not very hard  Food Insecurity: No Food Insecurity (12/14/2023)   Hunger Vital Sign    Worried About Running Out of Food in the Last Year: Never true    Ran Out of Food in the Last Year: Never true  Transportation Needs: No Transportation Needs (12/14/2023)   PRAPARE - Administrator, Civil Service (Medical): No    Lack of Transportation (Non-Medical): No  Physical Activity: Not on file  Stress: Not on file  Social Connections: Not on file  Intimate Partner Violence: Not At Risk (12/14/2023)   Humiliation, Afraid, Rape, and Kick questionnaire    Fear of Current or Ex-Partner: No    Emotionally Abused: No    Physically Abused: No    Sexually Abused: No    Family History  Problem Relation Age of Onset   High blood pressure Mother    Heart attack Neg Hx    Cancer Neg Hx    Diabetes Neg Hx    Vitals:   12/23/23 0847  BP: 114/62  Pulse: 88  SpO2: 100%  Weight: 74.8 kg (164 lb 12.8 oz)    PHYSICAL EXAM: General:  well appearing.  No respiratory difficulty. Walked into clinic HEENT: normal Neck: supple. JVD flat. Carotids 2+ bilat; no bruits. No lymphadenopathy or thyromegaly appreciated. Cor: PMI nondisplaced. Regular rate & rhythm. No rubs, gallops or  murmurs. Lungs: clear, diminished bases Abdomen: soft, nontender, nondistended. No hepatosplenomegaly. No bruits or masses. Good bowel sounds. Extremities: no cyanosis, clubbing, rash, edema  Neuro: alert & oriented x 3, cranial nerves grossly intact. moves all 4 extremities w/o difficulty. Affect pleasant.   ECG: NSR, LVH 85 bpm. QTc 502 (Personally reviewed)    ReDs reading: 32 %, normal  ASSESSMENT & PLAN: Chronic combined systolic and diastolic HF - Echo 11/24 EF 35-40%, GHK, mod LVH, GIDD, normal RV, severe LA dilatation, mild-mod  MR, severe AI, aortic root dilatation - Suspected recent exacerbation 2/2 uncontrolled HTN>worsening AI> volume overload NYHA I-II. Appears euvolemic GDMT:  Diuretic- Continue lasix 40 mg daily PRN, discussed minimal use with addition of SGLT2i. If he needs to take encouraged to only take half (20 mg).  BB- Continue coreg 25 mg BID Ace/ARB/ARNI Continue Entresto 97-103 mg BID MRA Continue spiro 25 mg daily SGLT2i - Start Jardiance 10 mg daily. Previously unable to afford. Discussed with pharmacy team, cost for Jardiance will be 30$ monthly with co-pay card. Patient ok with this. BMET/BNP today, repeat BMET at appt  on 3/4.  - cMRI tomorrow  CAD - NSTEMI 1/20 >> Myoview 11/2018: Lg inf scar, EF 34 >> medical management 2/2 CKD - LHC 4/22: 15% stenosis in mid LAD, otherwise no CAD  - denies CP - Continue ASA / statin  HTN - Continue Hydral 100 mg TID, Imdur 90 mg BID, Entresto 97-103 mg BID, coreg 25 mg BID - BP stable today  Severe AI - Mod-severe on 11/24 echo - outpatient cMRI tomorrow to further assess  Ascending Aortic dilatation - Echo from 09/2023 noted dilatation of the aortic root measuring 40 mm, dilatation of the ascending aorta measuring 45 mm  - pending cMRI to further assess (scheduled for tomorrow)  HLD - Continue Cresor 40 mg daily and zetia 10 mg daily - LDL 47 1/25   Referred to HFSW (PCP, Medications, Transportation, ETOH  Abuse, Drug Abuse, Insurance, Surveyor, quantity ):  No Refer to Pharmacy: No Refer to Home Health: No Refer to Advanced Heart Failure Clinic: Yes or no (plan to evaluate post cMRI) Refer to General Cardiology: Patient of Dr. Izora Ribas  Follow up in AHF/TOC clinic in 4-6 weeks

## 2023-12-23 ENCOUNTER — Other Ambulatory Visit (HOSPITAL_COMMUNITY): Payer: Self-pay

## 2023-12-23 ENCOUNTER — Ambulatory Visit (HOSPITAL_COMMUNITY)
Admit: 2023-12-23 | Discharge: 2023-12-23 | Disposition: A | Discharge status: Home / Self Care | Payer: 59 | Attending: Internal Medicine | Admitting: Internal Medicine

## 2023-12-23 ENCOUNTER — Encounter (HOSPITAL_COMMUNITY): Payer: Self-pay

## 2023-12-23 VITALS — BP 114/62 | HR 88 | Wt 164.8 lb

## 2023-12-23 DIAGNOSIS — I509 Heart failure, unspecified: Secondary | ICD-10-CM | POA: Diagnosis not present

## 2023-12-23 DIAGNOSIS — N182 Chronic kidney disease, stage 2 (mild): Secondary | ICD-10-CM | POA: Insufficient documentation

## 2023-12-23 DIAGNOSIS — I351 Nonrheumatic aortic (valve) insufficiency: Secondary | ICD-10-CM | POA: Insufficient documentation

## 2023-12-23 DIAGNOSIS — I10 Essential (primary) hypertension: Secondary | ICD-10-CM

## 2023-12-23 DIAGNOSIS — N1831 Chronic kidney disease, stage 3a: Secondary | ICD-10-CM

## 2023-12-23 DIAGNOSIS — I7121 Aneurysm of the ascending aorta, without rupture: Secondary | ICD-10-CM

## 2023-12-23 DIAGNOSIS — I130 Hypertensive heart and chronic kidney disease with heart failure and stage 1 through stage 4 chronic kidney disease, or unspecified chronic kidney disease: Secondary | ICD-10-CM | POA: Insufficient documentation

## 2023-12-23 DIAGNOSIS — Z7984 Long term (current) use of oral hypoglycemic drugs: Secondary | ICD-10-CM | POA: Diagnosis not present

## 2023-12-23 DIAGNOSIS — Z8249 Family history of ischemic heart disease and other diseases of the circulatory system: Secondary | ICD-10-CM | POA: Diagnosis not present

## 2023-12-23 DIAGNOSIS — Z87891 Personal history of nicotine dependence: Secondary | ICD-10-CM | POA: Insufficient documentation

## 2023-12-23 DIAGNOSIS — I428 Other cardiomyopathies: Secondary | ICD-10-CM | POA: Insufficient documentation

## 2023-12-23 DIAGNOSIS — I252 Old myocardial infarction: Secondary | ICD-10-CM | POA: Diagnosis not present

## 2023-12-23 DIAGNOSIS — E782 Mixed hyperlipidemia: Secondary | ICD-10-CM

## 2023-12-23 DIAGNOSIS — Z79899 Other long term (current) drug therapy: Secondary | ICD-10-CM | POA: Diagnosis not present

## 2023-12-23 DIAGNOSIS — I5042 Chronic combined systolic (congestive) and diastolic (congestive) heart failure: Secondary | ICD-10-CM | POA: Insufficient documentation

## 2023-12-23 DIAGNOSIS — E785 Hyperlipidemia, unspecified: Secondary | ICD-10-CM | POA: Diagnosis not present

## 2023-12-23 DIAGNOSIS — I2510 Atherosclerotic heart disease of native coronary artery without angina pectoris: Secondary | ICD-10-CM | POA: Insufficient documentation

## 2023-12-23 LAB — BASIC METABOLIC PANEL
Anion gap: 10 (ref 5–15)
BUN: 23 mg/dL — ABNORMAL HIGH (ref 6–20)
CO2: 22 mmol/L (ref 22–32)
Calcium: 8.6 mg/dL — ABNORMAL LOW (ref 8.9–10.3)
Chloride: 103 mmol/L (ref 98–111)
Creatinine, Ser: 1.61 mg/dL — ABNORMAL HIGH (ref 0.61–1.24)
GFR, Estimated: 49 mL/min — ABNORMAL LOW (ref >60)
Glucose, Bld: 87 mg/dL (ref 70–99)
Potassium: 3.9 mmol/L (ref 3.5–5.1)
Sodium: 135 mmol/L (ref 135–145)

## 2023-12-23 LAB — BRAIN NATRIURETIC PEPTIDE: B Natriuretic Peptide: 233.1 pg/mL — ABNORMAL HIGH (ref 0.0–100.0)

## 2023-12-23 MED ORDER — EZETIMIBE 10 MG PO TABS: 10.0000 mg | ORAL_TABLET | Freq: Every day | ORAL | 0 refills | Status: DC

## 2023-12-23 MED ORDER — SACUBITRIL-VALSARTAN 97-103 MG PO TABS: 1.0000 | ORAL_TABLET | Freq: Two times a day (BID) | ORAL | 0 refills | Status: DC

## 2023-12-23 MED ORDER — EMPAGLIFLOZIN 10 MG PO TABS: 10.0000 mg | ORAL_TABLET | Freq: Every day | ORAL | 5 refills | Status: DC

## 2023-12-23 NOTE — Progress Notes (Signed)
ReDS Vest / Clip - 12/23/23 0900       ReDS Vest / Clip   Station Marker C    Ruler Value 25    ReDS Value Range Low volume    ReDS Actual Value 32

## 2023-12-23 NOTE — Patient Instructions (Signed)
Medication Changes:  START: JARDIANCE 10MG  ONCE DAILY   Lab Work:  Labs done today, your results will be available in MyChart, we will contact you for abnormal readings.  THEN PLEASE HAVE LABS DONE AGAIN AT APPOINTMENT ON 3/4  Follow-Up in: AS SCHEDULED HERE IN CLINIC   At [facility], you and your health needs are our priority. We have a designated team specialized in the treatment of Heart Failure. This Care Team includes your primary Heart Failure Specialized Cardiologist (physician), Advanced Practice Providers (APPs- Physician Assistants and Nurse Practitioners), and Pharmacist who all work together to provide you with the care you need, when you need it.   You may see any of the following providers on your designated Care Team at your next follow up:  Dr. Arvilla Meres Dr. Marca Ancona Dr. Dorthula Nettles Dr. Theresia Bough Tonye Becket, NP Robbie Lis, [place] [facility] [place], [place] Brynda Peon, NP [place] Lee, NP Karle Plumber, PharmD   Please be sure to bring in all your medications bottles to every appointment.   Need to Contact us:  If you have any questions or concerns before your next appointment please send [place] a message through [place] or call our office at [phone].    TO LEAVE A MESSAGE FOR THE NURSE SELECT OPTION 2, PLEASE LEAVE A MESSAGE INCLUDING: YOUR NAME DATE OF BIRTH CALL BACK NUMBER REASON FOR CALL**this is important as we prioritize the call backs  YOU WILL RECEIVE A CALL BACK THE SAME DAY AS LONG AS YOU CALL BEFORE 4:00 PM

## 2023-12-24 ENCOUNTER — Telehealth (HOSPITAL_COMMUNITY): Payer: Self-pay | Admitting: Pharmacy Technician

## 2023-12-24 ENCOUNTER — Other Ambulatory Visit: Payer: Self-pay | Admitting: Physician Assistant

## 2023-12-24 ENCOUNTER — Ambulatory Visit (HOSPITAL_BASED_OUTPATIENT_CLINIC_OR_DEPARTMENT_OTHER)
Admission: RE | Admit: 2023-12-24 | Discharge: 2023-12-24 | Disposition: A | Discharge status: Home / Self Care | Payer: 59 | Source: Ambulatory Visit | Attending: Physician Assistant | Admitting: Physician Assistant

## 2023-12-24 DIAGNOSIS — I351 Nonrheumatic aortic (valve) insufficiency: Secondary | ICD-10-CM

## 2023-12-24 DIAGNOSIS — I5042 Chronic combined systolic (congestive) and diastolic (congestive) heart failure: Secondary | ICD-10-CM

## 2023-12-24 DIAGNOSIS — I7121 Aneurysm of the ascending aorta, without rupture: Secondary | ICD-10-CM

## 2023-12-24 DIAGNOSIS — I5020 Unspecified systolic (congestive) heart failure: Secondary | ICD-10-CM

## 2023-12-24 DIAGNOSIS — I428 Other cardiomyopathies: Secondary | ICD-10-CM | POA: Diagnosis not present

## 2023-12-24 MED ORDER — GADOBUTROL 1 MMOL/ML IV SOLN
7.0000 mL | Freq: Once | INTRAVENOUS | Status: AC | PRN
  Administered 2023-12-24: 7 mL via INTRAVENOUS

## 2023-12-24 NOTE — Telephone Encounter (Signed)
Pharmacy Patient Advocate Encounter  Insurance verification completed.   The patient is insured through  [facility]    Ran test claim for [place]. Currently a quantity of 30 is a $30 day supply and the co-pay is $205 (with the co-pay card, updated co-pay would be $30). Patient insurance prefers [facility], Marcelline Deist would require a PA.   This test claim was processed through [facility]- copay amounts may vary at other pharmacies due to pharmacy/plan contracts, or as the patient moves through the different stages of their insurance plan.   Archer Asa, CPhT

## 2024-01-04 NOTE — Progress Notes (Deleted)
 {This patient may be at risk for Amyloid. He has one or more dx on the problem list or PMH from the following list - Abnormal EKG, CHF, Aortic Stenosis, Proteinuria, LVH, Carpal Tunnel Syndrome, Biceps Tendon Rupture, Syncope. See list below or review PMH.  Diagnoses From Problem List           Noted     CHF (congestive heart failure) (HCC) 12/14/2023     Heart failure (HCC) 12/14/2023     HFrEF (heart failure with reduced ejection fraction) (HCC) 11/11/2018     Prolonged QT interval 02/03/2021    Click HERE to open Cardiac Amyloid Screening SmartSet to order screening OR Click HERE to defer testing for 1 year or permanently :1}    Cardiology Office Note:    Date:  01/04/2024  ID:  Dayna Ramus, DOB November 19, 1960, MRN [id number] PCP: Clide Dales, PA  [facility] Providers Cardiologist:  Christell Constant, MD Cardiology APP:  Beatrice Lecher, PA-C { Click to update primary MD,subspecialty MD or APP then REFRESH:1}    {Click to Open Review  :1}   Patient Profile:     *** Coronary artery disease NSTEMI in 11/2018 >> Myoview 11/2018: Lg inf scar, EF 34 >> Med Rx 2/2 CKD Cath 4/22: non-obstructive CAD [mLAD 15] (HFrEF) heart failure with reduced ejection fraction  Non-ischemic cardiomyopathy TTE 11/2018: EF 40-45 TTE 4/22:  EF 25-30 >> TTE 7/22: EF 40-45 TTE 09/23/22: EF 30-35, global HK severe LVE, Gr 1 DD, NL RVSF, mild MR< mod AI, AV sclerosis, no AS, mild dilation of Ao root (43 mm), mild dilation of Asc Aorta (44 mm), RAP 8 TTE 09/16/23: EF 35-40, global HK, mod conc LVH, Gr 2 DD, GLS -11.7, NL RVSF, severe LAE, mild to mod MR, severe AI (reviewed with Dr. Izora Ribas >> mod to severe), aortic root 40 mm, ascending aorta 45 mm, RAP 3  Moderate aortic insufficiency  TTE 09/2023: mod to severe >> CMR recommended (not done - 11/2023) NSVT Chronic kidney disease Ascending thoracic aortic aneurysm TTE 4/22; TTE 11/23:  44 mm CT 7/22: 44 mm >> rpt 1 year  CT 12/2022:  4.5 cm  Lung nodule (3 mm LUL by CT in 7/22)  +Tobacco smoking  Hypertension Hyperlipidemia Prostate CA  Aortic atherosclerosis         { Atzel Mccambridge is a 63 y.o. male who returns for ***     :1}     Discussed the use of AI scribe software for clinical note transcription with the patient, who gave verbal consent to proceed. History of Present Illness Garrin Kirwan is a 63 y.o. male who returns for post hospitalization follow up. He was last seen in clinic in 11/2023. He was stable at that time. He was admitted 2/10-2/11 with a/c CHF.  He had missed his heart failure medications on the day of admission and became significantly hypertensive, which likely worsened his aortic insufficiency.  He was diuresed with IV Lasix.  His troponins were elevated and flat.  This was not consistent with ACS and felt to be due to demand ischemia.  Follow-up cardiac MRI and chest MRA have been performed since discharge and demonstrated 46 mm ascending thoracic aortic aneurysm,***.  ROS-See HPI***    Studies Reviewed:       *** Results    Risk Assessment/Calculations:   {Does this patient have ATRIAL FIBRILLATION?[phone]} No BP recorded.  {Refresh Note OR Click here to enter BP  :1}***  Physical Exam:   VS:  There were no vitals taken for this visit.   Wt Readings from Last 3 Encounters:  12/23/23 164 lb 12.8 oz (74.8 kg)  12/15/23 160 lb 9.6 oz (72.8 kg)  12/04/23 183 lb 6.4 oz (83.2 kg)    Physical Exam***     Assessment and Plan:   Assessment & Plan HFrEF (heart failure with reduced ejection fraction) (HCC)  Aneurysm of ascending aorta without rupture (HCC)  Moderate aortic insufficiency  Essential hypertension  NSVT (nonsustained ventricular tachycardia) (HCC)  Assessment and Plan Assessment & Plan    {   HFrEF (heart failure with reduced ejection fraction) (HCC) Non-ischemic cardiomyopathy. NYHA II. Volume status stable.  He has not been able to afford SGLT2  diameter in the past.  LVF was stable on echocardiogram in November 2024 with EF 35-40. -Continue carvedilol 25 mg twice daily, hydralazine 100 mg 3 times a day, Imdur 90 mg daily, Entresto 97/103 mg twice daily, spironolactone 25 mg daily -CMET, CBC today -Follow-up 6 months Aneurysm of ascending aorta without rupture (HCC) 4.5 cm by CT in February 2024.  Ascending aorta 45 mm on echocardiogram in November 2024.  Patient was to undergo cardiac MRI and chest MRA 2 weeks ago but he did not show for this.  He notes that he was not contacted. -Reschedule cardiac MRI and chest MRA Moderate aortic insufficiency Previous echo in November 2024 was reviewed with Dr. Raynelle Jan.  Patient has moderate to severe AI.  I am unable to hear an AI murmur on exam.  He is currently clinically stable.  As noted, he needs cardiac MRI to further assess his aortic valve. -Reschedule cardiac MRI, chest MRA Coronary artery disease involving native coronary artery of native heart without angina pectoris History of non-STEMI in 2020 managed medically due to chronic kidney disease.  Cardiac cath in 2022 with nonobstructive CAD.  He is not having symptoms of angina. -Continue ASA 81 mg daily, carvedilol 25 mg twice daily, Imdur 90 mg daily, Crestor 40 mg daily Essential hypertension The patient's blood pressure is controlled on his current regimen.  Continue current therapy.  Mixed hyperlipidemia Continue Crestor 40 mg once daily, Zetia 10 mg once daily.  -CMET, Lipids today   :1}    {Are you ordering a CV Procedure (e.g. stress test, cath, DCCV, TEE, etc)?   Press F2        :[id number]}  Dispo:  No follow-ups on file.  Signed, Tereso Newcomer, PA-C

## 2024-01-05 ENCOUNTER — Ambulatory Visit: Payer: 59 | Attending: Physician Assistant | Admitting: Physician Assistant

## 2024-01-05 DIAGNOSIS — I5020 Unspecified systolic (congestive) heart failure: Secondary | ICD-10-CM

## 2024-01-05 DIAGNOSIS — I4729 Other ventricular tachycardia: Secondary | ICD-10-CM

## 2024-01-05 DIAGNOSIS — I351 Nonrheumatic aortic (valve) insufficiency: Secondary | ICD-10-CM

## 2024-01-05 DIAGNOSIS — I10 Essential (primary) hypertension: Secondary | ICD-10-CM

## 2024-01-05 DIAGNOSIS — I7121 Aneurysm of the ascending aorta, without rupture: Secondary | ICD-10-CM

## 2024-01-07 ENCOUNTER — Telehealth: Payer: Self-pay | Admitting: *Deleted

## 2024-01-07 DIAGNOSIS — I7121 Aneurysm of the ascending aorta, without rupture: Secondary | ICD-10-CM

## 2024-01-07 NOTE — Telephone Encounter (Signed)
-----   Message from [place] sent at 01/07/2024  9:00 AM EST ----- Cardiac MRI shows severely reduced heart function (ejection fraction). There is moderate leakage of the aortic valve (aortic insufficiency). The ascending aorta is moderately dilated.  Copy sent to NP at [facility] Clinic as FYI. PLAN:  -Continue current medications -Keep follow up with CHF clinic as planned for post hospitalization follow up -Arrange 2D echocardiogram in 6 mos to recheck EF, aortic insufficiency  -Refer to CT surgery for ascending thoracic aortic aneurysm -Arrange follow up with Dr. Izora Ribas in 6 mos (make sure echocardiogram done before OV) Tereso Newcomer, PA-C    01/07/2024 8:53 AM

## 2024-01-26 ENCOUNTER — Telehealth (HOSPITAL_COMMUNITY): Payer: Self-pay

## 2024-01-26 NOTE — Telephone Encounter (Signed)
 Called to confirm/remind patient of their appointment at the [facility] on 01/27/2024 8:15.   Appointment:   [] Confirmed  [x] Left mess   [] No answer/No voice mail  [] Phone not in service  Patient reminded to bring all medications and/or complete list.  Confirmed patient has transportation. Gave directions, instructed to utilize valet parking.

## 2024-01-27 ENCOUNTER — Ambulatory Visit (HOSPITAL_COMMUNITY): Payer: 59

## 2024-01-27 ENCOUNTER — Other Ambulatory Visit (HOSPITAL_COMMUNITY): Payer: Self-pay

## 2024-01-27 ENCOUNTER — Telehealth (HOSPITAL_COMMUNITY): Payer: Self-pay

## 2024-01-27 NOTE — Telephone Encounter (Signed)
 Patient Advocate Encounter  Prior authorization for Sherryll Burger has been submitted and approved. Test billing returns refill too soon rejection, unable to confirm copay at this time  Key: [id number] Effective: 01/27/2024 to 01/26/2025  Burnell Blanks, CPhT Rx Patient Advocate Phone: [phone]

## 2024-02-04 ENCOUNTER — Encounter: Payer: Self-pay | Admitting: Surgery

## 2024-02-04 ENCOUNTER — Encounter

## 2024-03-07 ENCOUNTER — Encounter: Payer: Self-pay | Admitting: Surgery

## 2024-03-07 ENCOUNTER — Ambulatory Visit

## 2024-04-06 ENCOUNTER — Other Ambulatory Visit (HOSPITAL_COMMUNITY): Payer: Self-pay

## 2024-04-06 DIAGNOSIS — I10 Essential (primary) hypertension: Secondary | ICD-10-CM

## 2024-04-06 DIAGNOSIS — I5042 Chronic combined systolic (congestive) and diastolic (congestive) heart failure: Secondary | ICD-10-CM

## 2024-04-06 DIAGNOSIS — N1831 Chronic kidney disease, stage 3a: Secondary | ICD-10-CM

## 2024-04-06 DIAGNOSIS — E782 Mixed hyperlipidemia: Secondary | ICD-10-CM

## 2024-04-06 DIAGNOSIS — Z79899 Other long term (current) drug therapy: Secondary | ICD-10-CM

## 2024-04-06 MED ORDER — EZETIMIBE 10 MG PO TABS: 10.0000 mg | ORAL_TABLET | Freq: Every day | ORAL | 0 refills | Status: DC

## 2024-04-06 MED ORDER — ENTRESTO 97-103 MG PO TABS: 1.0000 | ORAL_TABLET | Freq: Two times a day (BID) | ORAL | 0 refills | Status: DC

## 2024-04-14 ENCOUNTER — Other Ambulatory Visit: Payer: Self-pay

## 2024-04-14 ENCOUNTER — Encounter (HOSPITAL_COMMUNITY): Payer: Self-pay | Admitting: Internal Medicine

## 2024-04-14 ENCOUNTER — Inpatient Hospital Stay (HOSPITAL_BASED_OUTPATIENT_CLINIC_OR_DEPARTMENT_OTHER)
Admission: EM | Admit: 2024-04-14 | Discharge: 2024-04-19 | DRG: 286 | Disposition: A | Discharge status: Home / Self Care | Attending: Student | Admitting: Student

## 2024-04-14 ENCOUNTER — Inpatient Hospital Stay (HOSPITAL_COMMUNITY)

## 2024-04-14 ENCOUNTER — Emergency Department (HOSPITAL_BASED_OUTPATIENT_CLINIC_OR_DEPARTMENT_OTHER)

## 2024-04-14 DIAGNOSIS — I351 Nonrheumatic aortic (valve) insufficiency: Secondary | ICD-10-CM | POA: Diagnosis not present

## 2024-04-14 DIAGNOSIS — I2510 Atherosclerotic heart disease of native coronary artery without angina pectoris: Secondary | ICD-10-CM | POA: Diagnosis present

## 2024-04-14 DIAGNOSIS — I420 Dilated cardiomyopathy: Secondary | ICD-10-CM | POA: Diagnosis present

## 2024-04-14 DIAGNOSIS — I472 Ventricular tachycardia, unspecified: Secondary | ICD-10-CM | POA: Diagnosis present

## 2024-04-14 DIAGNOSIS — J9601 Acute respiratory failure with hypoxia: Secondary | ICD-10-CM | POA: Diagnosis present

## 2024-04-14 DIAGNOSIS — I493 Ventricular premature depolarization: Secondary | ICD-10-CM | POA: Diagnosis present

## 2024-04-14 DIAGNOSIS — I7121 Aneurysm of the ascending aorta, without rupture: Secondary | ICD-10-CM | POA: Diagnosis present

## 2024-04-14 DIAGNOSIS — E43 Unspecified severe protein-calorie malnutrition: Secondary | ICD-10-CM | POA: Diagnosis present

## 2024-04-14 DIAGNOSIS — Z604 Social exclusion and rejection: Secondary | ICD-10-CM | POA: Diagnosis present

## 2024-04-14 DIAGNOSIS — Z79899 Other long term (current) drug therapy: Secondary | ICD-10-CM | POA: Diagnosis not present

## 2024-04-14 DIAGNOSIS — I447 Left bundle-branch block, unspecified: Secondary | ICD-10-CM | POA: Diagnosis present

## 2024-04-14 DIAGNOSIS — I2583 Coronary atherosclerosis due to lipid rich plaque: Secondary | ICD-10-CM

## 2024-04-14 DIAGNOSIS — R0603 Acute respiratory distress: Secondary | ICD-10-CM | POA: Diagnosis present

## 2024-04-14 DIAGNOSIS — R0602 Shortness of breath: Secondary | ICD-10-CM | POA: Diagnosis not present

## 2024-04-14 DIAGNOSIS — Z7984 Long term (current) use of oral hypoglycemic drugs: Secondary | ICD-10-CM

## 2024-04-14 DIAGNOSIS — R7989 Other specified abnormal findings of blood chemistry: Secondary | ICD-10-CM | POA: Diagnosis not present

## 2024-04-14 DIAGNOSIS — Z681 Body mass index (BMI) 19 or less, adult: Secondary | ICD-10-CM

## 2024-04-14 DIAGNOSIS — I130 Hypertensive heart and chronic kidney disease with heart failure and stage 1 through stage 4 chronic kidney disease, or unspecified chronic kidney disease: Secondary | ICD-10-CM | POA: Diagnosis present

## 2024-04-14 DIAGNOSIS — I10 Essential (primary) hypertension: Secondary | ICD-10-CM | POA: Diagnosis not present

## 2024-04-14 DIAGNOSIS — N1832 Chronic kidney disease, stage 3b: Secondary | ICD-10-CM | POA: Diagnosis present

## 2024-04-14 DIAGNOSIS — N1831 Chronic kidney disease, stage 3a: Secondary | ICD-10-CM | POA: Diagnosis not present

## 2024-04-14 DIAGNOSIS — D631 Anemia in chronic kidney disease: Secondary | ICD-10-CM | POA: Diagnosis present

## 2024-04-14 DIAGNOSIS — I951 Orthostatic hypotension: Secondary | ICD-10-CM | POA: Diagnosis not present

## 2024-04-14 DIAGNOSIS — I080 Rheumatic disorders of both mitral and aortic valves: Secondary | ICD-10-CM | POA: Diagnosis present

## 2024-04-14 DIAGNOSIS — T502X5A Adverse effect of carbonic-anhydrase inhibitors, benzothiadiazides and other diuretics, initial encounter: Secondary | ICD-10-CM | POA: Diagnosis present

## 2024-04-14 DIAGNOSIS — E871 Hypo-osmolality and hyponatremia: Secondary | ICD-10-CM | POA: Diagnosis present

## 2024-04-14 DIAGNOSIS — E875 Hyperkalemia: Secondary | ICD-10-CM | POA: Diagnosis not present

## 2024-04-14 DIAGNOSIS — I428 Other cardiomyopathies: Secondary | ICD-10-CM

## 2024-04-14 DIAGNOSIS — I160 Hypertensive urgency: Secondary | ICD-10-CM | POA: Diagnosis present

## 2024-04-14 DIAGNOSIS — Z87891 Personal history of nicotine dependence: Secondary | ICD-10-CM

## 2024-04-14 DIAGNOSIS — I2489 Other forms of acute ischemic heart disease: Secondary | ICD-10-CM | POA: Diagnosis present

## 2024-04-14 DIAGNOSIS — K5900 Constipation, unspecified: Secondary | ICD-10-CM | POA: Diagnosis present

## 2024-04-14 DIAGNOSIS — I5022 Chronic systolic (congestive) heart failure: Secondary | ICD-10-CM

## 2024-04-14 DIAGNOSIS — E876 Hypokalemia: Secondary | ICD-10-CM | POA: Diagnosis not present

## 2024-04-14 DIAGNOSIS — N183 Chronic kidney disease, stage 3 unspecified: Secondary | ICD-10-CM | POA: Diagnosis present

## 2024-04-14 DIAGNOSIS — I5020 Unspecified systolic (congestive) heart failure: Secondary | ICD-10-CM | POA: Diagnosis present

## 2024-04-14 DIAGNOSIS — I5023 Acute on chronic systolic (congestive) heart failure: Secondary | ICD-10-CM | POA: Diagnosis present

## 2024-04-14 DIAGNOSIS — R54 Age-related physical debility: Secondary | ICD-10-CM | POA: Diagnosis present

## 2024-04-14 DIAGNOSIS — Z91119 Patient's noncompliance with dietary regimen due to unspecified reason: Secondary | ICD-10-CM

## 2024-04-14 DIAGNOSIS — Z8546 Personal history of malignant neoplasm of prostate: Secondary | ICD-10-CM

## 2024-04-14 DIAGNOSIS — N179 Acute kidney failure, unspecified: Secondary | ICD-10-CM | POA: Diagnosis present

## 2024-04-14 DIAGNOSIS — E785 Hyperlipidemia, unspecified: Secondary | ICD-10-CM | POA: Diagnosis present

## 2024-04-14 DIAGNOSIS — I3139 Other pericardial effusion (noninflammatory): Secondary | ICD-10-CM | POA: Diagnosis present

## 2024-04-14 DIAGNOSIS — I252 Old myocardial infarction: Secondary | ICD-10-CM

## 2024-04-14 DIAGNOSIS — Z7982 Long term (current) use of aspirin: Secondary | ICD-10-CM

## 2024-04-14 DIAGNOSIS — D72829 Elevated white blood cell count, unspecified: Secondary | ICD-10-CM | POA: Diagnosis present

## 2024-04-14 DIAGNOSIS — Z87442 Personal history of urinary calculi: Secondary | ICD-10-CM

## 2024-04-14 DIAGNOSIS — I509 Heart failure, unspecified: Principal | ICD-10-CM

## 2024-04-14 LAB — LACTIC ACID, PLASMA
Lactic Acid, Venous: 2.8 mmol/L (ref 0.5–1.9)
Lactic Acid, Venous: 1.6 mmol/L (ref 0.5–1.9)
Lactic Acid, Venous: 1.4 mmol/L (ref 0.5–1.9)

## 2024-04-14 LAB — CBC
HCT: 31.6 % — ABNORMAL LOW (ref 39.0–52.0)
HCT: 29.4 % — ABNORMAL LOW (ref 39.0–52.0)
Hemoglobin: 10.5 g/dL — ABNORMAL LOW (ref 13.0–17.0)
Hemoglobin: 9.7 g/dL — ABNORMAL LOW (ref 13.0–17.0)
MCH: 30.3 pg (ref 26.0–34.0)
MCH: 30.2 pg (ref 26.0–34.0)
MCHC: 33.2 g/dL (ref 30.0–36.0)
MCHC: 33 g/dL (ref 30.0–36.0)
MCV: 91.3 fL (ref 80.0–100.0)
MCV: 91.6 fL (ref 80.0–100.0)
Platelets: 319 10*3/uL (ref 150–400)
Platelets: 320 10*3/uL (ref 150–400)
RBC: 3.46 MIL/uL — ABNORMAL LOW (ref 4.22–5.81)
RBC: 3.21 MIL/uL — ABNORMAL LOW (ref 4.22–5.81)
RDW: 17.2 % — ABNORMAL HIGH (ref 11.5–15.5)
RDW: 17.1 % — ABNORMAL HIGH (ref 11.5–15.5)
WBC: 10.9 10*3/uL — ABNORMAL HIGH (ref 4.0–10.5)
WBC: 12.6 10*3/uL — ABNORMAL HIGH (ref 4.0–10.5)
nRBC: 0 % (ref 0.0–0.2)
nRBC: 0 % (ref 0.0–0.2)

## 2024-04-14 LAB — COMPREHENSIVE METABOLIC PANEL WITH GFR
ALT: 9 U/L (ref 0–44)
ALT: 10 U/L (ref 0–44)
AST: 19 U/L (ref 15–41)
AST: 17 U/L (ref 15–41)
Albumin: 4.7 g/dL (ref 3.5–5.0)
Albumin: 3.5 g/dL (ref 3.5–5.0)
Alkaline Phosphatase: 76 U/L (ref 38–126)
Alkaline Phosphatase: 54 U/L (ref 38–126)
Anion gap: 18 — ABNORMAL HIGH (ref 5–15)
Anion gap: 11 (ref 5–15)
BUN: 18 mg/dL (ref 8–23)
BUN: 21 mg/dL (ref 8–23)
CO2: 19 mmol/L — ABNORMAL LOW (ref 22–32)
CO2: 20 mmol/L — ABNORMAL LOW (ref 22–32)
Calcium: 10 mg/dL (ref 8.9–10.3)
Calcium: 9.4 mg/dL (ref 8.9–10.3)
Chloride: 106 mmol/L (ref 98–111)
Chloride: 109 mmol/L (ref 98–111)
Creatinine, Ser: 1.29 mg/dL — ABNORMAL HIGH (ref 0.61–1.24)
Creatinine, Ser: 1.53 mg/dL — ABNORMAL HIGH (ref 0.61–1.24)
GFR, Estimated: >60 mL/min (ref >60)
GFR, Estimated: 51 mL/min — ABNORMAL LOW (ref >60)
Glucose, Bld: 123 mg/dL — ABNORMAL HIGH (ref 70–99)
Glucose, Bld: 125 mg/dL — ABNORMAL HIGH (ref 70–99)
Potassium: 3.8 mmol/L (ref 3.5–5.1)
Potassium: 3.8 mmol/L (ref 3.5–5.1)
Sodium: 143 mmol/L (ref 135–145)
Sodium: 140 mmol/L (ref 135–145)
Total Bilirubin: 0.9 mg/dL (ref 0.0–1.2)
Total Bilirubin: 1.1 mg/dL (ref 0.0–1.2)
Total Protein: 8.3 g/dL — ABNORMAL HIGH (ref 6.5–8.1)
Total Protein: 6.9 g/dL (ref 6.5–8.1)

## 2024-04-14 LAB — RAPID URINE DRUG SCREEN, HOSP PERFORMED
Amphetamines: NOT DETECTED
Barbiturates: NOT DETECTED
Benzodiazepines: NOT DETECTED
Cocaine: NOT DETECTED
Opiates: NOT DETECTED
Tetrahydrocannabinol: POSITIVE — AB

## 2024-04-14 LAB — GLUCOSE, CAPILLARY
Glucose-Capillary: 138 mg/dL — ABNORMAL HIGH (ref 70–99)
Glucose-Capillary: 131 mg/dL — ABNORMAL HIGH (ref 70–99)

## 2024-04-14 LAB — ETHANOL: Alcohol, Ethyl (B): <15 mg/dL (ref <15)

## 2024-04-14 LAB — HEMOGLOBIN A1C
Hgb A1c MFr Bld: 5.5 % (ref 4.8–5.6)
Mean Plasma Glucose: 111.15 mg/dL

## 2024-04-14 LAB — MAGNESIUM
Magnesium: 1.9 mg/dL (ref 1.7–2.4)
Magnesium: 1.8 mg/dL (ref 1.7–2.4)

## 2024-04-14 LAB — TROPONIN T, HIGH SENSITIVITY
Troponin T High Sensitivity: 62 ng/L — ABNORMAL HIGH (ref <19)
Troponin T High Sensitivity: 52 ng/L — ABNORMAL HIGH (ref <19)

## 2024-04-14 LAB — PROCALCITONIN: Procalcitonin: <0.1 ng/mL

## 2024-04-14 LAB — MRSA NEXT GEN BY PCR, NASAL: MRSA by PCR Next Gen: NOT DETECTED

## 2024-04-14 LAB — PRO BRAIN NATRIURETIC PEPTIDE: Pro Brain Natriuretic Peptide: 5937 pg/mL — ABNORMAL HIGH (ref <300.0)

## 2024-04-14 MED ORDER — HYDRALAZINE HCL 50 MG PO TABS
100.0000 mg | ORAL_TABLET | Freq: Three times a day (TID) | ORAL | Status: DC
  Administered 2024-04-14 – 2024-04-16 (×5): 100 mg via ORAL
  Filled 2024-04-14 (×7): qty 2

## 2024-04-14 MED ORDER — POTASSIUM CHLORIDE CRYS ER 20 MEQ PO TBCR
40.0000 meq | EXTENDED_RELEASE_TABLET | Freq: Once | ORAL | Status: AC
  Administered 2024-04-14: 40 meq via ORAL
  Filled 2024-04-14: qty 2

## 2024-04-14 MED ORDER — INSULIN ASPART 100 UNIT/ML IJ SOLN: 0.0000 [IU] | Freq: Every day | INTRAMUSCULAR | Status: DC

## 2024-04-14 MED ORDER — SODIUM CHLORIDE 0.9% FLUSH
3.0000 mL | Freq: Two times a day (BID) | INTRAVENOUS | Status: DC
  Administered 2024-04-14 – 2024-04-19 (×8): 3 mL via INTRAVENOUS

## 2024-04-14 MED ORDER — FUROSEMIDE 10 MG/ML IJ SOLN
40.0000 mg | Freq: Two times a day (BID) | INTRAMUSCULAR | Status: AC
  Administered 2024-04-14 – 2024-04-15 (×3): 40 mg via INTRAVENOUS
  Filled 2024-04-14 (×3): qty 4

## 2024-04-14 MED ORDER — ONDANSETRON HCL 4 MG/2ML IJ SOLN
4.0000 mg | Freq: Once | INTRAMUSCULAR | Status: DC
  Filled 2024-04-14: qty 2

## 2024-04-14 MED ORDER — SPIRONOLACTONE 25 MG PO TABS
25.0000 mg | ORAL_TABLET | Freq: Every day | ORAL | Status: DC
  Administered 2024-04-14: 25 mg via ORAL
  Filled 2024-04-14: qty 2

## 2024-04-14 MED ORDER — ACETAMINOPHEN 325 MG PO TABS: 650.0000 mg | ORAL_TABLET | Freq: Four times a day (QID) | ORAL | Status: DC | PRN

## 2024-04-14 MED ORDER — NITROGLYCERIN IN D5W 200-5 MCG/ML-% IV SOLN
0.0000 ug/min | INTRAVENOUS | Status: DC
  Administered 2024-04-14: 100 ug/min via INTRAVENOUS
  Administered 2024-04-14 – 2024-04-15 (×5): 200 ug/min via INTRAVENOUS
  Filled 2024-04-14 (×6): qty 250

## 2024-04-14 MED ORDER — FUROSEMIDE 10 MG/ML IJ SOLN: 40.0000 mg | Freq: Two times a day (BID) | INTRAMUSCULAR | Status: DC

## 2024-04-14 MED ORDER — FUROSEMIDE 10 MG/ML IJ SOLN
40.0000 mg | Freq: Once | INTRAMUSCULAR | Status: AC
  Administered 2024-04-14: 40 mg via INTRAVENOUS
  Filled 2024-04-14: qty 4

## 2024-04-14 MED ORDER — SACUBITRIL-VALSARTAN 97-103 MG PO TABS: 1.0000 | ORAL_TABLET | Freq: Two times a day (BID) | ORAL | Status: DC

## 2024-04-14 MED ORDER — ACETAMINOPHEN 650 MG RE SUPP: 650.0000 mg | Freq: Four times a day (QID) | RECTAL | Status: DC | PRN

## 2024-04-14 MED ORDER — BISACODYL 5 MG PO TBEC: 5.0000 mg | DELAYED_RELEASE_TABLET | Freq: Every day | ORAL | Status: DC | PRN

## 2024-04-14 MED ORDER — POLYETHYLENE GLYCOL 3350 17 G PO PACK: 17.0000 g | PACK | Freq: Every day | ORAL | Status: DC | PRN

## 2024-04-14 MED ORDER — ROSUVASTATIN CALCIUM 20 MG PO TABS
40.0000 mg | ORAL_TABLET | Freq: Every day | ORAL | Status: DC
  Administered 2024-04-15 – 2024-04-18 (×4): 40 mg via ORAL
  Filled 2024-04-14 (×4): qty 2

## 2024-04-14 MED ORDER — ASPIRIN 81 MG PO TBEC
81.0000 mg | DELAYED_RELEASE_TABLET | Freq: Every day | ORAL | Status: DC
  Administered 2024-04-14 – 2024-04-19 (×6): 81 mg via ORAL
  Filled 2024-04-14 (×7): qty 1

## 2024-04-14 MED ORDER — HYDRALAZINE HCL 50 MG PO TABS
100.0000 mg | ORAL_TABLET | Freq: Three times a day (TID) | ORAL | Status: DC | PRN
  Administered 2024-04-14: 100 mg via ORAL
  Filled 2024-04-14: qty 4

## 2024-04-14 MED ORDER — HYDRALAZINE HCL 20 MG/ML IJ SOLN: 10.0000 mg | INTRAMUSCULAR | Status: DC | PRN

## 2024-04-14 MED ORDER — MAGNESIUM SULFATE 2 GM/50ML IV SOLN
2.0000 g | Freq: Once | INTRAVENOUS | Status: AC
  Administered 2024-04-14: 2 g via INTRAVENOUS
  Filled 2024-04-14: qty 50

## 2024-04-14 MED ORDER — METOCLOPRAMIDE HCL 5 MG/ML IJ SOLN
10.0000 mg | Freq: Once | INTRAMUSCULAR | Status: AC | PRN
  Administered 2024-04-14: 10 mg via INTRAVENOUS
  Filled 2024-04-14: qty 2

## 2024-04-14 MED ORDER — SPIRONOLACTONE 25 MG PO TABS
25.0000 mg | ORAL_TABLET | Freq: Every day | ORAL | Status: DC
  Administered 2024-04-15 – 2024-04-17 (×3): 25 mg via ORAL
  Filled 2024-04-14 (×3): qty 1

## 2024-04-14 MED ORDER — SACUBITRIL-VALSARTAN 97-103 MG PO TABS
1.0000 | ORAL_TABLET | Freq: Two times a day (BID) | ORAL | Status: DC
  Administered 2024-04-14 – 2024-04-17 (×6): 1 via ORAL
  Filled 2024-04-14 (×7): qty 1

## 2024-04-14 MED ORDER — INSULIN ASPART 100 UNIT/ML IJ SOLN
0.0000 [IU] | Freq: Three times a day (TID) | INTRAMUSCULAR | Status: DC
  Administered 2024-04-14: 2 [IU] via SUBCUTANEOUS

## 2024-04-14 MED ORDER — HEPARIN SODIUM (PORCINE) 5000 UNIT/ML IJ SOLN
5000.0000 [IU] | Freq: Three times a day (TID) | INTRAMUSCULAR | Status: DC
  Administered 2024-04-14 – 2024-04-19 (×14): 5000 [IU] via SUBCUTANEOUS
  Filled 2024-04-14 (×16): qty 1

## 2024-04-14 MED ORDER — CARVEDILOL 25 MG PO TABS
25.0000 mg | ORAL_TABLET | Freq: Two times a day (BID) | ORAL | Status: DC
  Administered 2024-04-14 – 2024-04-16 (×6): 25 mg via ORAL
  Filled 2024-04-14 (×2): qty 1
  Filled 2024-04-14: qty 2
  Filled 2024-04-14 (×4): qty 1

## 2024-04-14 MED ORDER — EZETIMIBE 10 MG PO TABS
10.0000 mg | ORAL_TABLET | Freq: Every day | ORAL | Status: DC
  Administered 2024-04-14 – 2024-04-19 (×6): 10 mg via ORAL
  Filled 2024-04-14 (×6): qty 1

## 2024-04-14 NOTE — ED Notes (Signed)
 Patient taken off BiPAP due to feeling like he was going to vomit. RN giving zofran  and we will try again when he feels better

## 2024-04-14 NOTE — Consult Note (Signed)
 Cardiology Consultation   Patient ID: Darius Rivers MRN: [id number]; DOB: 1961/04/08  Admit date: 04/14/2024 Date of Consult: 04/14/2024  PCP:  Alejo Amsler, PA   [facility] Providers Cardiologist:  Jann Melody, MD  Cardiology APP:  Gabino Joe, PA-C .    Patient Profile: Darius Rivers is a 63 y.o. male with a hx of HFrEF, nonischemic cardiomyopathy, CAD status post STEMI in 2020, moderate to severe aortic insufficiency, hypertension, nonsustained VT, aneurysm of the ascending aorta, CKD, tobacco use, and hyperlipidemia who is being seen 04/14/2024 for the evaluation of congestive heart failure at the request of Brunilda Capra MD.  History of Present Illness: Mr. Pitsenbarger is a 63 y.o. male who had an NSTEMI in 11/2018 and it was managed medically due to his CKD.  A Myoview  on 11/2018 showed a large inferior scar and a reduced LVEF of 34% and an echo showed a reduced LVEF of 40 to 45%.  In 02/2021 his LVEF had further declined to 25 to 30% because of this cardiomyopathy he had cardiac cath in 02/2021 that showed mild nonobstructive CAD with 15% stenosis in the LAD.  After this his LVEF slightly improved and on 09/2023 an echo showed his LVEF was 35 to 40% with moderate concentric LVH, grade 2 diastolic dysfunction, severe left atrial enlargement, mild to moderate mitral regurgitation, Moderate to severe aortic insufficiency (per Dr. Paulita Boss) and dilated ascending aorta of 45 mm.  Because of the aortic insufficiency and nonischemic cardiomyopathy the patient had a cardiac MRI on 12/24/2023 that showed severe LV dysfunction consistent with dilated cardiomyopathy, mild RV dysfunction, moderate aortic aneurysm 46 cm, and moderate central aortic regurgitation  He was admitted to the hospital on 12/14/2023 for CHF and uncontrolled hypertension. His BNP is greater than 45,000, and chest x-ray showed pulmonary congestion.  Was diuresed with IV Lasix  and restarted on  blood pressure medications and sent home euvolemic.  Patient presented to the emergency department on 04/14/2024 for worsening shortness of breath over the past day.  On interview patient reported that the shortness of breath started yesterday at about 2 PM.  Reported that he also had nausea and vomited the that the emesis was yellow in color.  Has had some chills. Denies any orthopnea, lower extremity edema, diarrhea, chest pain, and PND.  Stated that he had not eaten anything for breakfast or lunch the day that the shortness of breath started.  Denies missing any dose of his blood pressure medications. Since being hospitalized he was started on IV Lasix  and feels like his shortness of breath has improved.  Denies any tobacco use, smokes cannabis about twice a week.  Drinks about 1 beer once a week.  Reported that he walks about 1 mile a day at work.  Blood pressure has been elevated since hospitalization.   Labs showed High-sensitivity troponins 62> 52.  Lactic acid was elevated at 2.8, proBNP was elevated at 5937.0, had normocytic anemia with a hemoglobin of 10.5, normal potassium of 3.8, normal sodium of 143, normal magnesium  of 1.9, elevated creatinine of 1.29.  This appears to be about baseline for the patient.  Chest x-ray showed mild to moderate bronchitis changes, mild interstitial pulmonary edema with minimal bilateral perihilar edema.  EKG showed Sinus tachycardia with a rate of 109, LAE, LVH, LAD, 2 PVC's, borderline intraventricular conduction delay, lateral T wave inversions seen on previous EKG  Past Medical History:  Diagnosis Date   Chronic kidney disease stage II  Coronary artery disease    NSTEMI in 11/2018 - Lg scar on Myoview ; Rx medically due to CKD // Cath 4/22: mLAD 15 (no sig CAD)   HFrEF (heart failure with reduced EF)    NICM // Echocardiogram 11/2018: EF 40-45 // Echocardiogram 4/22:  EF 25-30 // Echo 7/22: EF 40-45, global HK normal RVSF, mild LAE, mild MR, moderate AI,  mild AV sclerosis without stenosis, mild dilation of aortic root (42 mm)   Hyperlipidemia    Hypertension    Lung nodule    CT 7/22: Left upper lobe 3 mm> repeat chest CT in 1 year   Nephrolithiasis    NSVT (nonsustained ventricular tachycardia)    Prostate cancer (HCC)    watchful waiting, due to go back to urology   Thoracic ascending aortic aneurysm    Echo 4/22: 44 mm // Chest CTA 7/22: Ascending thoracic aortic aneurysm 4.4 cm.  3 mm left upper lobe nodule.  Aortic atherosclerosis.    Past Surgical History:  Procedure Laterality Date   CYSTOSCOPY WITH URETEROSCOPY, STONE BASKETRY AND STENT PLACEMENT     put a stent in; pulled stent out later   INGUINAL HERNIA REPAIR Bilateral 1980s   PROSTATE BIOPSY     RIGHT/LEFT HEART CATH AND CORONARY ANGIOGRAPHY N/A 02/06/2021   Procedure: RIGHT/LEFT HEART CATH AND CORONARY ANGIOGRAPHY;  Surgeon: Millicent Ally, MD;  Location: [facility];  Service: Cardiovascular;  Laterality: N/A;     Home Medications:  Prior to Admission medications   Medication Sig Start Date End Date Taking? Authorizing Provider  aspirin  EC 81 MG tablet Take 81 mg by mouth daily.    [provider]  carvedilol  (COREG ) 25 MG tablet Take 1 tablet (25 mg total) by mouth 2 (two) times daily with a meal. 11/07/22   Sonny Dust, MD  empagliflozin  (JARDIANCE ) 10 MG TABS tablet Take 1 tablet (10 mg total) by mouth daily before breakfast. 12/23/23   Sheryl Donna, NP  ezetimibe  (ZETIA ) 10 MG tablet Take 1 tablet (10 mg total) by mouth daily. 04/06/24   Sheryl Donna, NP  furosemide  (LASIX ) 40 MG tablet Take 1 tablet (40 mg total) by mouth daily as needed for fluid or edema (weight gain >3 lbs in one day or >5 lbs in one week). 12/15/23 12/14/24  Debria Fang, PA-C  hydrALAZINE  (APRESOLINE ) 100 MG tablet Take 1 tablet (100 mg total) by mouth 3 (three) times daily. 09/30/22   Sonny Dust, MD  isosorbide  mononitrate (IMDUR ) 30 MG 24 hr tablet Take 3  tablets (90 mg total) by mouth 2 (two) times daily. 02/04/23   Sonny Dust, MD  rosuvastatin  (CRESTOR ) 40 MG tablet Take 1 tablet (40 mg total) by mouth daily at 6 PM. 12/08/22   Sonny Dust, MD  sacubitril -valsartan  (ENTRESTO ) 97-103 MG Take 1 tablet by mouth 2 (two) times daily. 04/06/24   Sheryl Donna, NP  spironolactone  (ALDACTONE ) 25 MG tablet Take 1 tablet (25 mg total) by mouth daily. 01/01/23   Sonny Dust, MD    Scheduled Meds:  carvedilol   25 mg Oral BID   sacubitril -valsartan   1 tablet Oral BID   spironolactone   25 mg Oral Daily   Continuous Infusions:  nitroGLYCERIN  200 mcg/min (04/14/24 1200)   PRN Meds: hydrALAZINE   Allergies:   No Known Allergies  Social History:   Social History   Socioeconomic History   Marital status: Divorced    Spouse name: Not on file  Number of children: 2   Years of education: Not on file   Highest education level: High school graduate  Occupational History   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  Tobacco Use   Smoking status: Former    Current packs/day: 0.00    Average packs/day: 0.3 packs/day for 20.0 years (6.6 ttl pk-yrs)    Types: Cigarettes    Start date: 11/11/1978    Quit date: 11/11/1998    Years since quitting: 25.4   Smokeless tobacco: Never  Vaping Use   Vaping status: Never Used  Substance and Sexual Activity   Alcohol use: Not Currently   Drug use: Yes    Types: Marijuana    Comment: 2 x per month   Sexual activity: Yes  Other Topics Concern   Not on file  Social History Narrative   Not on file   Social Drivers of Health   Financial Resource Strain: Low Risk  (12/15/2023)   Overall Financial Resource Strain (CARDIA)    Difficulty of Paying Living Expenses: Not very hard  Food Insecurity: No Food Insecurity (04/14/2024)   Hunger Vital Sign    Worried About Running Out of Food in the Last Year: Never true    Ran  Out of Food in the Last Year: Never true  Transportation Needs: No Transportation Needs (04/14/2024)   PRAPARE - Administrator, Civil Service (Medical): No    Lack of Transportation (Non-Medical): No  Physical Activity: Not on file  Stress: Not on file  Social Connections: Socially Isolated (04/14/2024)   Social Connection and Isolation Panel    Frequency of Communication with Friends and Family: Three times a week    Frequency of Social Gatherings with Friends and Family: More than three times a week    Attends Religious Services: Never    Database administrator or Organizations: No    Attends Banker Meetings: Never    Marital Status: Widowed  Intimate Partner Violence: Not At Risk (04/14/2024)   Humiliation, Afraid, Rape, and Kick questionnaire    Fear of Current or Ex-Partner: No    Emotionally Abused: No    Physically Abused: No    Sexually Abused: No    Family History:    Family History  Problem Relation Age of Onset   High blood pressure Mother    Heart attack Neg Hx    Cancer Neg Hx    Diabetes Neg Hx      ROS:  Please see the history of present illness.   All other ROS reviewed and negative.     Physical Exam/Data: Vitals:   04/14/24 1355 04/14/24 1400 04/14/24 1410 04/14/24 1505  BP: (!) 156/104 (!) 166/116 (!) 164/113 (!) 160/110  Pulse: 88 93 (!) 102 85  Resp: (!) 33 (!) 27 (!) 31 20  Temp:    98.3 F (36.8 C)  TempSrc:    Oral  SpO2: 100% 100% 100% 100%  Weight:      Height:    6' 4.5 (1.943 m)    Intake/Output Summary (Last 24 hours) at 04/14/2024 1542 Last data filed at 04/14/2024 1307 Gross per 24 hour  Intake --  Output 250 ml  Net -250 ml      04/14/2024    9:57 AM 12/23/2023    8:47 AM 12/15/2023    4:23 AM  Last 3 Weights  Weight (lbs) 179 lb  164 lb 12.8 oz 160 lb 9.6 oz  Weight (kg) 81.194 kg 74.753 kg 72.848 kg     Body mass index is 21.5 kg/m.  General:  Well nourished, well developed, male appearing about  stated age in no acute distress HEENT: normal Neck: no JVD Vascular: No carotid bruits; Distal pulses 2+ bilaterally Cardiac: 2 out of 6 systolic murmur heard best in the right upper sternal border Lungs: Wheezing and crackles heard throughout the lungs Abd: soft, nontender, no hepatomegaly  Ext: no edema Musculoskeletal:  No deformities Skin: warm and dry  Neuro:  no focal abnormalities noted Psych:  Normal affect   EKG:  The EKG was personally reviewed and demonstrates:  Sinus tachycardia with a rate of 109, LAE, LVH, LAD, 2 PVC's, borderline intraventricular conduction delay, lateral T wave inversions seen on previous EKG. Telemetry:  Telemetry was personally reviewed and demonstrates: Normal sinus rhythm with heart rates in the 80s to 90s  Relevant CV Studies: Echo pending  Laboratory Data: High Sensitivity Troponin:  No results for input(s): TROPONINIHS in the last 720 hours.   Chemistry Recent Labs  Lab 04/14/24 1018 04/14/24 1224  NA 143  --   K 3.8  --   CL 106  --   CO2 19*  --   GLUCOSE 123*  --   BUN 18  --   CREATININE 1.29*  --   CALCIUM  10.0  --   MG  --  1.9  GFRNONAA >60  --   ANIONGAP 18*  --     Recent Labs  Lab 04/14/24 1018  PROT 8.3*  ALBUMIN 4.7  AST 19  ALT 9  ALKPHOS 76  BILITOT 0.9   Lipids No results for input(s): CHOL, TRIG, HDL, LABVLDL, LDLCALC, CHOLHDL in the last 168 hours.  Hematology Recent Labs  Lab 04/14/24 1018  WBC 10.9*  RBC 3.46*  HGB 10.5*  HCT 31.6*  MCV 91.3  MCH 30.3  MCHC 33.2  RDW 17.2*  PLT 319   Thyroid  No results for input(s): TSH, FREET4 in the last 168 hours.  BNP Recent Labs  Lab 04/14/24 1018  PROBNP 5,937.0*    DDimer No results for input(s): DDIMER in the last 168 hours.  Radiology/Studies:  DG Chest 2 View Result Date: 04/14/2024 CLINICAL DATA:  Shortness of breath since yesterday. EXAM: CHEST - 2 VIEW COMPARISON:  12/14/2023 FINDINGS: Stable enlarged cardiac  silhouette. Tortuous and partially calcified thoracic aorta. Stable mild prominence of the interstitial markings with interval minimal patchy density in the perihilar and mid lung zones bilaterally. No pleural fluid seen. Mild-to-moderate peribronchial thickening. Unremarkable bones. IMPRESSION: 1. Mild-to-moderate bronchitic changes. 2. Stable cardiomegaly and mild interstitial pulmonary edema with interval minimal bilateral perihilar and mid lung zone alveolar edema. Pneumonia is less likely but not excluded. Electronically Signed   By: Catherin Closs M.D.   On: 04/14/2024 10:46     Assessment and Plan: Darius Rivers is a 63 y.o. male with a hx of HFrEF, nonischemic cardiomyopathy, CAD status post STEMI in 2020, moderate to severe aortic insufficiency, hypertension, nonsustained VT, aneurysm of the ascending aorta, CKD, tobacco use, and hyperlipidemia who is being seen 04/14/2024 for the evaluation of congestive heart failure at the request of Brunilda Capra MD.  HFrEF Nonischemic cardiomyopathy Moderate to severe aortic insufficiency He was admitted to the hospital on 12/14/2023 for CHF and uncontrolled hypertension. His BNP is greater than 45,000, and chest x-ray showed pulmonary congestion.  Was diuresed with IV Lasix  and restarted  on blood pressure medications and sent home euvolemic.  Cardiac MRI on 12/24/2023 that showed severe LV dysfunction consistent with dilated cardiomyopathy, mild RV dysfunction, moderate aortic aneurysm 46 cm, and moderate central aortic regurgitation Had shortness of breath that started at about 2 PM yesterday.  Denies any orthopnea but reported having PND throughout the night. Chest x-ray showed mild to moderate bronchitis changes, mild interstitial pulmonary edema with minimal bilateral perihilar edema.  proBNP was elevated at 5937.0.  He has received 2 dose of IV Lasix  40 mg and reported that his shortness of breath has improved.  - Echo pending -Potassium 3.8, Magnesium   1.9.  Order 40 mEq oral potassium. -Start IV Lasix  40 mg twice daily  GDMT - Continue Coreg  25 mg twice daily - Continue Entresto  97-103 twice daily - Continue spironolactone  25 mg daily - Hold Jardiance  plan to restart prior to discharge.   Hypertensive urgency Hypertension Since arriving at the hospital his blood pressure has been elevated most recent BP 157/100.  reported compliance with home blood pressure medications.  Blood pressures have been elevated at home 150/70. Received 1 dose of Coreg  25.  Was put on IV nitroglycerin .  I suspect that shortness of breath may be related to elevated blood pressure.  Will continue to monitor blood pressure. - Continue home hydralazine  100 mg every 8 hours - Continue IV nitroglycerin  - Continue Coreg  25 mg twice daily. - Continue Entresto  97 103 twice daily.  Hyperlipidemia CAD status post STEMI in 2020 Had cardiac catheterization in 2022 that showed mild nonobstructive CAD including 15% stenosis in the mid LAD. - LDL 47 on 11/2023.  Will order lipid panel for tomorrow.  - Continue aspirin  81 mg daily - Restart home Zetia  10 mg daily. - Restart home Crestor  40 mg daily.  Elevated high-sensitivity troponins 62> 52. - Had an elevated high-sensitivity troponins I suspect this is secondary to demand ischemia from heart failure and hypertensive urgency.    Aneurysm of the ascending aorta Was 45 mm on cardiac MRI. Needs yearly follow-up  Cannabis use - Reported that he smokes cannabis.  Other conditions managed per primary    Risk Assessment/Risk Scores:       [place]  Heart Association (NYHA) Functional Class NYHA Class II       For questions or updates, please contact [facility] Please consult [email or web address] for contact info under    Signed, Melita Springer, PA-C  04/14/2024 3:42 PM

## 2024-04-14 NOTE — Plan of Care (Signed)
   Problem: Education: Goal: Knowledge of General Education information will improve Description Including pain rating scale, medication(s)/side effects and non-pharmacologic comfort measures Outcome: Progressing

## 2024-04-14 NOTE — ED Notes (Signed)
 Patient taken off BiPAP again due to nausea and placed back on 15L HFNC. Spoke with MD. I feel like the safest thing to do at the moment is leave him on the HFNC unless his condition deteriorates. RT to monitor.

## 2024-04-14 NOTE — ED Notes (Signed)
 Pt. Is on High FLow OXygen and talking states he feels much better.

## 2024-04-14 NOTE — Progress Notes (Signed)
   04/14/24 2022  BiPAP/CPAP/SIPAP  BiPAP/CPAP/SIPAP Pt Type Adult  Reason BIPAP/CPAP not in use Non-compliant (pt refused)  BiPAP/CPAP /SiPAP Vitals  Pulse Rate 90  Resp 18  SpO2 99 %  Bilateral Breath Sounds Clear;Diminished

## 2024-04-14 NOTE — ED Notes (Signed)
 Patient placed back on BiPAP per his request at this time. 16/8 40%. Will continue to monitor

## 2024-04-14 NOTE — ED Notes (Signed)
 Lactate of 2.8 Critical given to RN Nolon Baxter By Moira Andrews from Lab

## 2024-04-14 NOTE — ED Notes (Signed)
 Per Dr. Aldean Amass EDP  at bedside ... Voice Order at bedside increase the gtt of NTG to 150 mcg

## 2024-04-14 NOTE — ED Notes (Signed)
 Patient placed on BiPAP per MD order. 16/8 and 40%. Patient seems to be tolerating well, appears to be more comfortable. RT to monitor as needed

## 2024-04-14 NOTE — Progress Notes (Addendum)
 Notified by nursing regarding occasional runs of NSVT. Per telemetry review, 5 beats at 1528, 3 beats at 1622, 13 beats at 1900, 5 beats then later 6 beats at 1903, 3 beats at 1923, and axis shift (possibly bundle branch block/still sinus rhythm around 1920). Per nurse, he is asymptomatic and diuresed about 1L since arriving to floor. Chart review reveals a history of NSVT while hospitalized in the past as well, treated with beta blocker therapy as of 2020. Reviewed with Dr. Albert Huff. Will continue current medical plan as outlined replete lytes - PM labs were recently checked with K 3.8 -> will give 40mEq now, Mg 1.8 -> will give 2g Mag sulfate now. Dr. Albert Huff also recommends checking UDS with consideration of switching carvedilol  to metoprolol  moving forward in AM if this is negative for cocaine and if episodes persist. We will also sign out to fellow to be aware in case episodes persist.

## 2024-04-14 NOTE — ED Notes (Signed)
 Pt on bipap and said that he was starting to feel like he was going to vomit. This RN removed bipap and placed pt back on 2L [place]. Dr. Aldean Amass to bedside. See new orders.   Anastacio Balm, RN

## 2024-04-14 NOTE — ED Notes (Signed)
 This RN spoke to pt concerning his condition and wishes. Pt would like to intubated in the event that it would save his life and would like to be a Full Code. Pt said he would like for us  to call his daughter to update her on his condition. Dr. Aldean Amass made aware.  Anastacio Balm, RN

## 2024-04-14 NOTE — ED Notes (Signed)
 EDP [place] at bedside sat to go to  on the NGT due to Pt. Pressure.    Pt. Asked to be placed back on BI Pap and his HR is at 111 to 88   He is SR to ST with PVCs noted.  Pt. Seems tired with little output of urine.

## 2024-04-14 NOTE — ED Triage Notes (Signed)
 Pt presents with complaint of SOB since yesterday. Worse with exertion. Reports right side yesterday and then SOB started Denies cough. Reports vomitng x 1

## 2024-04-14 NOTE — H&P (Signed)
 History and Physical    Patient: Darius Rivers EAV:[id number] DOB: Dec 19, 1960 DOA: 04/14/2024 DOS: the patient was seen and examined on 04/14/2024 PCP: Alejo Amsler, PA  Patient coming from: [facility] Chief complaint: Chief Complaint  Patient presents with   Shortness of Breath   HPI:  Orman Matsumura is a 63 y.o. male with past medical history  of  HFrEF, aneurysm of ascending aorta without rupture, aortic insufficiency, coronary artery disease/NSTEMI in 2020 January,, hypertension, hyperlipidemia, prostate cancer, CKD, presents with concern for shortness of breath ongoing for the past day.  Per report and chart review shortness of breath started since yesterday.  Patient had reports of nausea earlier in the day.  ED Course: Pt in ed at bedside  is awake and dyspneic O2 sats improved currently on nasal cannula from initial BiPAP. Vital signs in the ED were notable for the following:  Vitals:   04/14/24 1410 04/14/24 1505 04/14/24 1653 04/14/24 1654  BP: (!) 164/113 (!) 160/110 (!) 157/100   Pulse: (!) 102 85 95 95  Temp:  98.3 F (36.8 C) 98.3 F (36.8 C)   Resp: (!) 31 20 (!) 35 19  Height:  6' 4.5 (1.943 m)    Weight:      SpO2: 100% 100% 96% 96%  TempSrc:  Oral Oral   >>ED evaluation thus far shows: -CMP shows bicarb of 9 and anion gap of 18, CKD stage II with a creatinine of 1.29 EGFR of more than 60. -Probnp- 5937.0 -TNI 62 and repeat TNI 52.  -mag 1.9 at 12:24.  - CBC showing white count of 10.9 hemoglobin of 10.5 platelets are within normal limits. -Alcohol level less than 15. -Initial EKG was sinus tach at 102 PR of 158 QTc prolonged at 501 PVCs and a left bundle branch block that was similar to previous EKG. -Chest x-ray showed stable cardiomegaly and mild interstitial pulmonary edema. Patient received Lasix  in the ED followed by Coreg  hydralazine  Entresto  and spironolactone  per cardiology recommendations per ED provider.  Patient was then accepted to  progressive unit for decompensated CHF volume overload hypertensive emergency.  >>While in the ED patient received the following: Medications  carvedilol  (COREG ) tablet 25 mg (25 mg Oral Given 04/14/24 1245)  hydrALAZINE  (APRESOLINE ) tablet 100 mg (100 mg Oral Given 04/14/24 1250)  spironolactone  (ALDACTONE ) tablet 25 mg (25 mg Oral Given 04/14/24 1133)  nitroGLYCERIN  50 mg in dextrose  5 % 250 mL (0.2 mg/mL) infusion (200 mcg/min Intravenous Rate/Dose Change 04/14/24 1200)  sacubitril -valsartan  (ENTRESTO ) 97-103 mg per tablet (has no administration in time range)  furosemide  (LASIX ) injection 40 mg (40 mg Intravenous Given 04/14/24 1102)  metoCLOPramide (REGLAN) injection 10 mg (10 mg Intravenous Given 04/14/24 1219)  furosemide  (LASIX ) injection 40 mg (40 mg Intravenous Given 04/14/24 1254)   Review of Systems  Respiratory:  Positive for shortness of breath.   Cardiovascular:  Positive for leg swelling.   Past Medical History:  Diagnosis Date   Chronic kidney disease stage II    Coronary artery disease    NSTEMI in 11/2018 - Lg scar on Myoview ; Rx medically due to CKD // Cath 4/22: mLAD 15 (no sig CAD)   HFrEF (heart failure with reduced EF)    NICM // Echocardiogram 11/2018: EF 40-45 // Echocardiogram 4/22:  EF 25-30 // Echo 7/22: EF 40-45, global HK normal RVSF, mild LAE, mild MR, moderate AI, mild AV sclerosis without stenosis, mild dilation of aortic root (42 mm)   Hyperlipidemia    Hypertension  Lung nodule    CT 7/22: Left upper lobe 3 mm> repeat chest CT in 1 year   Nephrolithiasis    NSVT (nonsustained ventricular tachycardia)    Prostate cancer (HCC)    watchful waiting, due to go back to urology   Thoracic ascending aortic aneurysm    Echo 4/22: 44 mm // Chest CTA 7/22: Ascending thoracic aortic aneurysm 4.4 cm.  3 mm left upper lobe nodule.  Aortic atherosclerosis.   Past Surgical History:  Procedure Laterality Date   CYSTOSCOPY WITH URETEROSCOPY, STONE BASKETRY AND STENT  PLACEMENT     put a stent in; pulled stent out later   INGUINAL HERNIA REPAIR Bilateral 1980s   PROSTATE BIOPSY     RIGHT/LEFT HEART CATH AND CORONARY ANGIOGRAPHY N/A 02/06/2021   Procedure: RIGHT/LEFT HEART CATH AND CORONARY ANGIOGRAPHY;  Surgeon: Millicent Ally, MD;  Location: [facility];  Service: Cardiovascular;  Laterality: N/A;    reports that he quit smoking about 25 years ago. His smoking use included cigarettes. He started smoking about 45 years ago. He has a 6.6 pack-year smoking history. He has never used smokeless tobacco. He reports that he does not currently use alcohol. He reports current drug use. Drug: Marijuana. No Known Allergies Family History  Problem Relation Age of Onset   High blood pressure Mother    Heart attack Neg Hx    Cancer Neg Hx    Diabetes Neg Hx    Prior to Admission medications   Medication Sig Start Date End Date Taking? Authorizing Provider  aspirin  EC 81 MG tablet Take 81 mg by mouth daily.    [provider]  carvedilol  (COREG ) 25 MG tablet Take 1 tablet (25 mg total) by mouth 2 (two) times daily with a meal. 11/07/22   Sonny Dust, MD  empagliflozin  (JARDIANCE ) 10 MG TABS tablet Take 1 tablet (10 mg total) by mouth daily before breakfast. 12/23/23   Sheryl Donna, NP  ezetimibe  (ZETIA ) 10 MG tablet Take 1 tablet (10 mg total) by mouth daily. 04/06/24   Sheryl Donna, NP  furosemide  (LASIX ) 40 MG tablet Take 1 tablet (40 mg total) by mouth daily as needed for fluid or edema (weight gain >3 lbs in one day or >5 lbs in one week). 12/15/23 12/14/24  Debria Fang, PA-C  hydrALAZINE  (APRESOLINE ) 100 MG tablet Take 1 tablet (100 mg total) by mouth 3 (three) times daily. 09/30/22   Sonny Dust, MD  isosorbide  mononitrate (IMDUR ) 30 MG 24 hr tablet Take 3 tablets (90 mg total) by mouth 2 (two) times daily. 02/04/23   Sonny Dust, MD  rosuvastatin  (CRESTOR ) 40 MG tablet Take 1 tablet (40 mg total) by mouth daily at 6 PM.  12/08/22   Sonny Dust, MD  sacubitril -valsartan  (ENTRESTO ) 97-103 MG Take 1 tablet by mouth 2 (two) times daily. 04/06/24   Sheryl Donna, NP  spironolactone  (ALDACTONE ) 25 MG tablet Take 1 tablet (25 mg total) by mouth daily. 01/01/23   Sonny Dust, MD  Vitals:   04/14/24 1410 04/14/24 1505 04/14/24 1653 04/14/24 1654  BP: (!) 164/113 (!) 160/110 (!) 157/100   Pulse: (!) 102 85 95 95  Resp: (!) 31 20 (!) 35 19  Temp:  98.3 F (36.8 C) 98.3 F (36.8 C)   TempSrc:  Oral Oral   SpO2: 100% 100% 96% 96%  Weight:      Height:  6' 4.5 (1.943 m)     Physical Exam Vitals and nursing note reviewed.  Constitutional:      General: He is not in acute distress.    Interventions: Nasal cannula in place.  HENT:     Head: Normocephalic and atraumatic.     Right Ear: Hearing and external ear normal.     Left Ear: Hearing and external ear normal.     Nose: No nasal deformity.     Mouth/Throat:     Lips: Pink.   Eyes:     General: Lids are normal.     Extraocular Movements: Extraocular movements intact.    Cardiovascular:     Rate and Rhythm: Normal rate and regular rhythm.     Pulses: Normal pulses.     Heart sounds: Normal heart sounds.  Pulmonary:     Effort: Pulmonary effort is normal.     Breath sounds: Decreased air movement present. Examination of the right-upper field reveals decreased breath sounds. Examination of the left-upper field reveals decreased breath sounds. Examination of the right-middle field reveals decreased breath sounds. Examination of the left-middle field reveals decreased breath sounds. Examination of the right-lower field reveals decreased breath sounds. Examination of the left-lower field reveals decreased breath sounds. Decreased breath sounds present.  Abdominal:     General: Bowel sounds are normal. There is no distension.     Palpations: Abdomen is soft. There is no  mass.     Tenderness: There is no abdominal tenderness.   Musculoskeletal:     Right lower leg: No edema.     Left lower leg: No edema.   Skin:    General: Skin is warm.   Neurological:     General: No focal deficit present.     Mental Status: He is alert and oriented to person, place, and time.     Cranial Nerves: Cranial nerves 2-12 are intact. No dysarthria or facial asymmetry.     Motor: Motor function is intact.   Psychiatric:        Attention and Perception: Attention normal.        Mood and Affect: Mood normal.        Speech: Speech normal.        Behavior: Behavior is cooperative.     Labs on Admission: I have personally reviewed following labs and imaging studies CBC: Recent Labs  Lab 04/14/24 1018 04/14/24 1736  WBC 10.9* 12.6*  HGB 10.5* 9.7*  HCT 31.6* 29.4*  MCV 91.3 91.6  PLT 319 320   Basic Metabolic Panel: Recent Labs  Lab 04/14/24 1018 04/14/24 1224 04/14/24 1736  NA 143  --  140  K 3.8  --  3.8  CL 106  --  109  CO2 19*  --  20*  GLUCOSE 123*  --  125*  BUN 18  --  21  CREATININE 1.29*  --  1.53*  CALCIUM  10.0  --  9.4  MG  --  1.9 1.8   GFR: Estimated Creatinine Clearance: 58.2 mL/min (A) (by C-G formula based on SCr of 1.53 mg/dL (H)). Liver  Function Tests: Recent Labs  Lab 04/14/24 1018 04/14/24 1736  AST 19 17  ALT 9 10  ALKPHOS 76 54  BILITOT 0.9 1.1  PROT 8.3* 6.9  ALBUMIN 4.7 3.5   No results for input(s): LIPASE, AMYLASE in the last 168 hours. No results for input(s): AMMONIA in the last 168 hours. Coagulation Profile: No results for input(s): INR, PROTIME in the last 168 hours. Cardiac Enzymes: No results for input(s): CKTOTAL, CKMB, CKMBINDEX, TROPONINI in the last 168 hours. BNP (last 3 results) Recent Labs    04/14/24 1018  PROBNP 5,937.0*   HbA1C: Recent Labs    04/14/24 1736  HGBA1C 5.5   CBG: Recent Labs  Lab 04/14/24 1652  GLUCAP 138*   Lipid Profile: No results for input(s):  CHOL, HDL, LDLCALC, TRIG, CHOLHDL, LDLDIRECT in the last 72 hours. Thyroid  Function Tests: No results for input(s): TSH, T4TOTAL, FREET4, T3FREE, THYROIDAB in the last 72 hours. Anemia Panel: No results for input(s): VITAMINB12, FOLATE, FERRITIN, TIBC, IRON, RETICCTPCT in the last 72 hours. Urine analysis:    Component Value Date/Time   COLORURINE YELLOW 02/04/2021 0010   APPEARANCEUR CLEAR 02/04/2021 0010   LABSPEC 1.025 02/04/2021 0010   PHURINE 6.0 02/04/2021 0010   GLUCOSEU NEGATIVE 02/04/2021 0010   HGBUR MODERATE (A) 02/04/2021 0010   BILIRUBINUR NEGATIVE 02/04/2021 0010   KETONESUR NEGATIVE 02/04/2021 0010   PROTEINUR NEGATIVE 02/04/2021 0010   NITRITE NEGATIVE 02/04/2021 0010   LEUKOCYTESUR NEGATIVE 02/04/2021 0010   Radiological Exams on Admission: DG Chest 2 View Result Date: 04/14/2024 CLINICAL DATA:  Shortness of breath since yesterday. EXAM: CHEST - 2 VIEW COMPARISON:  12/14/2023 FINDINGS: Stable enlarged cardiac silhouette. Tortuous and partially calcified thoracic aorta. Stable mild prominence of the interstitial markings with interval minimal patchy density in the perihilar and mid lung zones bilaterally. No pleural fluid seen. Mild-to-moderate peribronchial thickening. Unremarkable bones. IMPRESSION: 1. Mild-to-moderate bronchitic changes. 2. Stable cardiomegaly and mild interstitial pulmonary edema with interval minimal bilateral perihilar and mid lung zone alveolar edema. Pneumonia is less likely but not excluded. Electronically Signed   By: Catherin Closs M.D.   On: 04/14/2024 10:46   Data Reviewed: Relevant notes from primary care and specialist visits, past discharge summaries as available in EHR, including Care Everywhere . Prior diagnostic testing as pertinent to current admission diagnoses, Updated medications and problem lists for reconciliation .ED course, including vitals, labs, imaging, treatment and response to treatment,Triage  notes, nursing and pharmacy notes and ED provider's notes.Notable results as noted in HPI.Discussed case with EDMD/ ED APP/ or Specialty MD on call and as needed.  Assessment & Plan  >> Respiratory distress/acute hypoxic respiratory failure: Secondary to pulmonary vascular congestion interstitial edema most likely from congestive heart failure. Patient received Lasix  40 mg x 2 in the emergency room.  Will continue patient's Lasix  additionally per cardiology recommendation.  Strict I's and O's, daily weights.  2D echocardiogram with bubble study.  Monitor electrolytes and kidney function.  >> Acute decompensated systolic congestive heart failure: Greatly appreciate cardiology consult.  Suspect patient will require an echocardiogram this year last 1 was in November 2004 although about 6 months old EF on that echo was 35 to 40% with global hypokinesis, please see complete report. -pt did receive his entresto  and aldactone  in am I will continue as his gfr is more than 60, and will proceed with angio study for PE rule out and also to f/u on AAA. And also for any pericardial effusion.  -supplemental o2 and  low threshold for NIPPV.  - have also ordered procalcitonin as PNA was of concern.  We will start abx based on cta or if procalcitonin is positive.   >> Hypertensive urgency: Vitals:   04/14/24 1235 04/14/24 1245 04/14/24 1250 04/14/24 1300  BP: (!) 177/131 (!) 189/125 (!) 189/125 (!) 189/116   04/14/24 1315 04/14/24 1330 04/14/24 1345 04/14/24 1355  BP: (!) 174/108 (!) 165/120 (!) 161/114 (!) 156/104   04/14/24 1400 04/14/24 1410 04/14/24 1505 04/14/24 1653  BP: (!) 166/116 (!) 164/113 (!) 160/110 (!) 157/100  - Continued patient on nitroglycerin  drip that was started earlier.  Patient is continued on Coreg , hydralazine , Entresto , IV hydralazine  as needed.   >> CAD status post STEMI in 2020: - Mild troponin elevation secondary to demand ischemia.  Patient is not having any chest pain.   Troponins have trended down 62 initially and now 52..  We will continue patient on Crestor , aspirin .  >>Lactic acidosis: ? If infections related we will get cultures and follow.   >> Ascending aortic aneurysm: Will follow with CT angio today.   >> CKD stage IIIa: Lab Results  Component Value Date   CREATININE 1.53 (H) 04/14/2024   CREATININE 1.29 (H) 04/14/2024   CREATININE 1.61 (H) 12/23/2023  We will follow and monitor egfr is > 60 today.    >> Occasional alcohol use. Heparin  q8h. Blood sugars 123 , POCT glucose d/c.    >>Nausea/ hiccups: Aspiration precaution. IV PPI.     DVT prophylaxis:  Heparin  q8h. Consults:  Cardiology.  Advance Care Planning:    Code Status: Full Code   Family Communication:  Daughter September at bedside.   Disposition Plan:  Home.  Severity of Illness: The appropriate patient status for this patient is INPATIENT. Inpatient status is judged to be reasonable and necessary in order to provide the required intensity of service to ensure the patient's safety. The patient's presenting symptoms, physical exam findings, and initial radiographic and laboratory data in the context of their chronic comorbidities is felt to place them at high risk for further clinical deterioration. Furthermore, it is not anticipated that the patient will be medically stable for discharge from the hospital within 2 midnights of admission.   * I certify that at the point of admission it is my clinical judgment that the patient will require inpatient hospital care spanning beyond 2 midnights from the point of admission due to high intensity of service, high risk for further deterioration and high frequency of surveillance required.*  Unresulted Labs (From admission, onward)     Start     Ordered   04/15/24 0500  CBC  Tomorrow morning,   R       Question:  Specimen collection method  Answer:  Lab=Lab collect   04/14/24 1613   04/15/24 0500  Comprehensive metabolic panel   Tomorrow morning,   R       Question:  Specimen collection method  Answer:  Lab=Lab collect   04/14/24 1613   04/15/24 0500  Occult blood card to lab, stool RN will collect  Daily,   R     Question:  Specimen to be collected by:  Answer:  RN will collect   04/14/24 1717   04/15/24 0500  Lipid panel  Tomorrow morning,   R       Question:  Specimen collection method  Answer:  Lab=Lab collect   04/14/24 1753   04/14/24 1822  Culture, blood (Routine X 2) w Reflex to ID  Panel  BLOOD CULTURE X 2,   R      04/14/24 1821   04/14/24 1715  Lactic acid, plasma  STAT Now then every 3 hours,   STAT      04/14/24 1714   Pending  Lactic acid, plasma  ONCE - STAT,   R        Pending            Meds ordered this encounter  Medications   furosemide  (LASIX ) injection 40 mg   carvedilol  (COREG ) tablet 25 mg   DISCONTD: sacubitril -valsartan  (ENTRESTO ) 97-103 mg per tablet    ACE-inhibitors have NOT been administered in the past 36-hours.:   YES (confirmed by ordering provider)   DISCONTD: hydrALAZINE  (APRESOLINE ) tablet 100 mg   DISCONTD: spironolactone  (ALDACTONE ) tablet 25 mg   nitroGLYCERIN  50 mg in dextrose  5 % 250 mL (0.2 mg/mL) infusion   DISCONTD: ondansetron  (ZOFRAN ) injection 4 mg   metoCLOPramide (REGLAN) injection 10 mg   furosemide  (LASIX ) injection 40 mg   DISCONTD: sacubitril -valsartan  (ENTRESTO ) 97-103 mg per tablet    ACE-inhibitors have NOT been administered in the past 36-hours.:   YES (confirmed by ordering provider)   DISCONTD: insulin aspart (novoLOG) injection 0-15 Units    Correction coverage::   Moderate (average weight, post-op)    CBG < 70::   Implement Hypoglycemia Standing Orders and refer to Hypoglycemia Standing Orders sidebar report    CBG 70 - 120::   0 units    CBG 121 - 150::   2 units    CBG 151 - 200::   3 units    CBG 201 - 250::   5 units    CBG 251 - 300::   8 units    CBG 301 - 350::   11 units    CBG 351 - 400::   15 units    CBG > 400:   call MD and  obtain STAT lab verification   DISCONTD: insulin aspart (novoLOG) injection 0-5 Units    Correction coverage::   HS scale    CBG < 70::   Implement Hypoglycemia Standing Orders and refer to Hypoglycemia Standing Orders sidebar report    CBG 70 - 120::   0 units    CBG 121 - 150::   0 units    CBG 151 - 200::   0 units    CBG 201 - 250::   2 units    CBG 251 - 300::   3 units    CBG 301 - 350::   4 units    CBG 351 - 400::   5 units    CBG > 400:   call MD and obtain STAT lab verification   sodium chloride  flush (NS) 0.9 % injection 3 mL   OR Linked Order Group    acetaminophen  (TYLENOL ) tablet 650 mg    acetaminophen  (TYLENOL ) suppository 650 mg   polyethylene glycol (MIRALAX  / GLYCOLAX ) packet 17 g   bisacodyl  (DULCOLAX) EC tablet 5 mg   heparin  injection 5,000 Units   DISCONTD: furosemide  (LASIX ) injection 40 mg   rosuvastatin  (CRESTOR ) tablet 40 mg   ezetimibe  (ZETIA ) tablet 10 mg   aspirin  EC tablet 81 mg   furosemide  (LASIX ) injection 40 mg   hydrALAZINE  (APRESOLINE ) injection 10 mg   sacubitril -valsartan  (ENTRESTO ) 97-103 mg per tablet    ACE-inhibitors have NOT been administered in the past 36-hours.:   YES (confirmed by ordering provider)   spironolactone  (ALDACTONE ) tablet 25 mg  hydrALAZINE  (APRESOLINE ) tablet 100 mg     Orders Placed This Encounter  Procedures   MRSA Next Gen by PCR, Nasal   Culture, blood (Routine X 2) w Reflex to ID Panel   DG Chest 2 View   CT CHEST WO CONTRAST   NM Pulmonary Perfusion   CBC   Pro Brain natriuretic peptide   Comprehensive metabolic panel   Magnesium    Lactic acid, plasma   Ethanol   Magnesium    Procalcitonin   Hemoglobin A1c   CBC   CBC   Comprehensive metabolic panel   Glucose, capillary   Lactic acid, plasma   Comprehensive metabolic panel   Occult blood card to lab, stool RN will collect   Lipid panel   Diet Heart Room service appropriate? Yes; Fluid consistency: Thin; Fluid restriction: 1500 mL Fluid    Document Height and Actual Weight   If O2 Sat <94% administer O2 at 2 liters/minute via nasal cannula   Saline Lock IV, Maintain IV access (when placed in a treatment room)   Cardiac Monitoring - Continuous Indefinite   Notify physician (specify)   Initiate Heart Failure Care Plan   Daily weights   Strict intake and output   In and Out Cath   Patient Education:   Apply Heart Failure Care Plan   [facility] and AP only) Obtain REDS clips reading Every morning   Apply Diabetes Mellitus Care Plan   STAT CBG when hypoglycemia is suspected. If treated, recheck every 15 minutes after each treatment until CBG >/= 70 mg/dl   Refer to Hypoglycemia Protocol Sidebar Report for treatment of CBG < 70 mg/dl   Patient has an active order for admit to inpatient/place in observation   Cardiac Monitoring Continuous x 24 hours Indications for use: Sub-acute heart failure   Maintain IV access   Vital signs   Notify physician (specify)   Mobility Protocol: No Restrictions RN to initiate protocols based on patient's level of care   Refer to Sidebar Report Refer to ICU, Med-Surg, Progressive, and Step-Down Mobility Protocol Sidebars   Initiate Adult Central Line Maintenance and Catheter Protocol for patients with central line (CVC, PICC, Port, Hemodialysis, Trialysis)   If patient diabetic or glucose greater than 140 notify physician for Sliding Scale Insulin Orders   Do not place and if present remove PureWick   Initiate Oral Care Protocol   Initiate Carrier Fluid Protocol   RN may order General Admission PRN Orders utilizing General Admission PRN medications (through manage orders) for the following patient needs: allergy symptoms (Claritin), cold sores (Carmex), cough (Robitussin DM), eye irritation (Liquifilm Tears), hemorrhoids (Tucks), indigestion (Maalox), minor skin irritation (Hydrocortisone Cream), muscle pain (Ben Gay), nose irritation (saline nasal spray) and sore throat (Chloraseptic spray).   Full  code   Consult to hospitalist   Consult to Heart Failure Navigation Team   Consult to Transition of Care Team   Consult to Heart Failure Navigation Team [facility] [place])   Nutritional services consult   OT eval and treat   PT eval and treat   ED Pulse oximetry, continuous (when placed in a treatment room)   Bipap   Bipap   Pulse oximetry check with vital signs   Oxygen therapy Mode or (Route): Nasal cannula; Liters Per Minute: 2; Keep O2 saturation between: greater than 92 %   Incentive spirometry   ED EKG   EKG 12-Lead   Repeat EKG   EKG 12-Lead   EKG 12-Lead   EKG  EKG   Insert peripheral IV   Admit to Inpatient (patient's expected length of stay will be greater than 2 midnights or inpatient only procedure)    Author: Lavanda Porter, MD 12 pm- 8 pm. [facility]. 04/14/2024 7:46 PM >>Please note for any concern,or critical results after hours past 8pm please contact the [facility] floor coverage provider from 7 PM- 7 AM. For on call review [email or web address], username [email or web address] and PW: your phone number<<    Note CTA chest changed to ct noncontrast and v/q/ scan.

## 2024-04-14 NOTE — ED Notes (Signed)
 Patient taken off BiPAP again due to nausea. Placed on 15L HFNC. MD aware

## 2024-04-14 NOTE — ED Provider Notes (Signed)
 Patient presents with shortness of breath since yesterday.  Feels like prior CHF to him.  Workup is consistent with CHF.  He ultimately started to have increased work of breathing and so he was put on BiPAP with good improvement in symptoms.  Nitroglycerin  drip was added to help treat his uncontrolled hypertension.  I discussed with Dr. Lydia Sams who accepts for admission.  Discussed with cardiology, Dr. Micael Adas, who recommends a second dose of Lasix  as he has only had about 150 cc out after his first 40 mg.  Patient was then also given his oral meds at her recommendation and will continue the nitroglycerin .  Patient ended up being on and off of BiPAP, mostly because at some point he would develop nausea.  He has a resolution of the nausea when the BiPAP comes off.  Breathing wise he feels best on the BiPAP but also seems to have good work of breathing when on high flow nasal cannula in between.  Will leave him on high flow for now for transfer.  No chest pain to suggest this is ACS, dissection.  Doubt PE.  Blood pressure is steadily improving with the nitroglycerin  and his p.o. meds.  CRITICAL CARE Performed by: Pierce Brewster   Total critical care time: 50 minutes  Critical care time was exclusive of separately billable procedures and treating other patients.  Critical care was necessary to treat or prevent imminent or life-threatening deterioration.  Critical care was time spent personally by me on the following activities: development of treatment plan with patient and/or surrogate as well as nursing, discussions with consultants, evaluation of patient's response to treatment, examination of patient, obtaining history from patient or surrogate, ordering and performing treatments and interventions, ordering and review of laboratory studies, ordering and review of radiographic studies, pulse oximetry and re-evaluation of patient's condition.    Jerilynn Montenegro, MD 04/14/24 [phone]

## 2024-04-14 NOTE — Progress Notes (Signed)
 Call [facility] at [phone] re: Pt Darius Rivers, B[place] MRN [id number]  / Room: [facility]   Pt is a  63  y.o. male with NSTEMI 2022 CAD, HFrEF, felt NICM, moderate-severe AI, NSVT, ascending aorta dilation, HTN, HLD presenting for acute on chronic decompensated systolic congestive heart failure.  Vitals:   04/14/24 1150 04/14/24 1155 04/14/24 1200 04/14/24 1205  BP: (!) 178/123 (!) 178/126 (!) 180/127 (!) 177/124  Pulse: 98 95 94 91  Temp:      Resp: (!) 29 20 (!) 22 15  Weight:      SpO2: 100% 100% 100% 100%  TempSrc:       Vitals:   04/14/24 0957 04/14/24 1100 04/14/24 1115 04/14/24 1130  BP: (!) 186/127 (!) 192/133 (!) 204/134 (!) 195/124   04/14/24 1135 04/14/24 1140 04/14/24 1145 04/14/24 1150  BP: (!) 185/118 (!) 191/128 (!) 176/132 (!) 178/123   04/14/24 1155 04/14/24 1200 04/14/24 1205  BP: (!) 178/126 (!) 180/127 (!) 177/124   Results for orders placed or performed during the hospital encounter of 04/14/24 (from the past 24 hours)  CBC     Status: Abnormal   Collection Time: 04/14/24 10:18 AM  Result Value Ref Range   WBC 10.9 (H) 4.0 - 10.5 K/uL   RBC 3.46 (L) 4.22 - 5.81 MIL/uL   Hemoglobin 10.5 (L) 13.0 - 17.0 g/dL   HCT 14.7 (L) 82.9 - 56.2 %   MCV 91.3 80.0 - 100.0 fL   MCH 30.3 26.0 - 34.0 pg   MCHC 33.2 30.0 - 36.0 g/dL   RDW 13.0 (H) 86.5 - 78.4 %   Platelets 319 150 - 400 K/uL   nRBC 0.0 0.0 - 0.2 %  Pro Brain natriuretic peptide     Status: Abnormal   Collection Time: 04/14/24 10:18 AM  Result Value Ref Range   Pro Brain Natriuretic Peptide 5,937.0 (H) <300.0 pg/mL  Troponin T, High Sensitivity     Status: Abnormal   Collection Time: 04/14/24 10:18 AM  Result Value Ref Range   Troponin T High Sensitivity 62 (H) <19 ng/L  Comprehensive metabolic panel     Status: Abnormal   Collection Time: 04/14/24 10:18 AM  Result Value Ref Range   Sodium 143 135 - 145 mmol/L   Potassium 3.8 3.5 - 5.1 mmol/L   Chloride 106 98 - 111 mmol/L   CO2 19 (L) 22 - 32 mmol/L    Glucose, Bld 123 (H) 70 - 99 mg/dL   BUN 18 8 - 23 mg/dL   Creatinine, Ser 6.96 (H) 0.61 - 1.24 mg/dL   Calcium  10.0 8.9 - 10.3 mg/dL   Total Protein 8.3 (H) 6.5 - 8.1 g/dL   Albumin 4.7 3.5 - 5.0 g/dL   AST 19 15 - 41 U/L   ALT 9 0 - 44 U/L   Alkaline Phosphatase 76 38 - 126 U/L   Total Bilirubin 0.9 0.0 - 1.2 mg/dL   GFR, Estimated >29 >52 mL/min   Anion gap 18 (H) 5 - 15      nitroGLYCERIN  150 mcg/min (04/14/24 1200)   SpO2: 100 % O2 Flow Rate (L/min): 2 L/min FiO2 (%): 40 % Bipap.  Requested cardiology input to determine if pt needs ICU vs stepdown. No lasix  drip recommended and stepdown can take NTG drip. Will inform cards when pt  arrives .    consult cardiology:  Pt accepted to [facility].

## 2024-04-14 NOTE — ED Provider Notes (Signed)
  EMERGENCY DEPARTMENT AT [facility] Provider Note   CSN: [id number] Arrival date & time: 04/14/24  6962     Patient presents with: Shortness of Breath   Darius Rivers is a 63 y.o. male with history of HFrEF, aneurysm of ascending aorta without rupture, aortic insufficiency, coronary artery disease, hypertension, hyperlipidemia, prostate cancer, CKD, presents with concern for shortness of breath ongoing for the past day.  States shortness of breath is intermittent.  It is not associated with exertion, food intake, or any particular activity.  He denies any chest pain or abdominal pain.  Denies any new pain or swelling in his legs.  Denies any cough, fevers, chills.    Shortness of Breath      Prior to Admission medications   Medication Sig Start Date End Date Taking? Authorizing Provider  aspirin  EC 81 MG tablet Take 81 mg by mouth daily.    [provider]  carvedilol  (COREG ) 25 MG tablet Take 1 tablet (25 mg total) by mouth 2 (two) times daily with a meal. 11/07/22   Sonny Dust, MD  empagliflozin  (JARDIANCE ) 10 MG TABS tablet Take 1 tablet (10 mg total) by mouth daily before breakfast. 12/23/23   Sheryl Donna, NP  ezetimibe  (ZETIA ) 10 MG tablet Take 1 tablet (10 mg total) by mouth daily. 04/06/24   Sheryl Donna, NP  furosemide  (LASIX ) 40 MG tablet Take 1 tablet (40 mg total) by mouth daily as needed for fluid or edema (weight gain >3 lbs in one day or >5 lbs in one week). 12/15/23 12/14/24  Debria Fang, PA-C  hydrALAZINE  (APRESOLINE ) 100 MG tablet Take 1 tablet (100 mg total) by mouth 3 (three) times daily. 09/30/22   Sonny Dust, MD  isosorbide  mononitrate (IMDUR ) 30 MG 24 hr tablet Take 3 tablets (90 mg total) by mouth 2 (two) times daily. 02/04/23   Sonny Dust, MD  rosuvastatin  (CRESTOR ) 40 MG tablet Take 1 tablet (40 mg total) by mouth daily at 6 PM. 12/08/22   Sonny Dust, MD  sacubitril -valsartan  (ENTRESTO )  97-103 MG Take 1 tablet by mouth 2 (two) times daily. 04/06/24   Sheryl Donna, NP  spironolactone  (ALDACTONE ) 25 MG tablet Take 1 tablet (25 mg total) by mouth daily. 01/01/23   Sonny Dust, MD    Allergies: Patient has no known allergies.    Review of Systems  Respiratory:  Positive for shortness of breath.     Updated Vital Signs BP (!) 189/125   Pulse (!) 117   Temp 98.3 F (36.8 C)   Resp (!) 35   Wt 81.2 kg   SpO2 98%   BMI 21.79 kg/m   Physical Exam Vitals and nursing note reviewed.  Constitutional:      General: He is not in acute distress.    Appearance: He is well-developed. He is not diaphoretic.  HENT:     Head: Normocephalic and atraumatic.   Eyes:     Conjunctiva/sclera: Conjunctivae normal.    Cardiovascular:     Rate and Rhythm: Regular rhythm. Tachycardia present.     Heart sounds: No murmur heard. Pulmonary:     Effort: No respiratory distress.     Breath sounds: Normal breath sounds.     Comments: Tachypnea but able to talk in mostly full sentences on room air.  Does dip down to about 90% 02 when talking. Abdominal:     Palpations: Abdomen is soft.     Tenderness:  There is no abdominal tenderness.   Musculoskeletal:        General: No swelling. Normal range of motion.     Cervical back: Neck supple.     Right lower leg: No edema.     Left lower leg: No edema.   Skin:    General: Skin is warm and dry.     Capillary Refill: Capillary refill takes less than 2 seconds.   Neurological:     General: No focal deficit present.     Mental Status: He is alert.   Psychiatric:        Mood and Affect: Mood normal.     (all labs ordered are listed, but only abnormal results are displayed) Labs Reviewed  CBC - Abnormal; Notable for the following components:      Result Value   WBC 10.9 (*)    RBC 3.46 (*)    Hemoglobin 10.5 (*)    HCT 31.6 (*)    RDW 17.2 (*)    All other components within normal limits  PRO BRAIN NATRIURETIC PEPTIDE -  Abnormal; Notable for the following components:   Pro Brain Natriuretic Peptide 5,937.0 (*)    All other components within normal limits  COMPREHENSIVE METABOLIC PANEL WITH GFR - Abnormal; Notable for the following components:   CO2 19 (*)    Glucose, Bld 123 (*)    Creatinine, Ser 1.29 (*)    Total Protein 8.3 (*)    Anion gap 18 (*)    All other components within normal limits  TROPONIN T, HIGH SENSITIVITY - Abnormal; Notable for the following components:   Troponin T High Sensitivity 62 (*)    All other components within normal limits  MAGNESIUM   LACTIC ACID, PLASMA  ETHANOL  LACTIC ACID, PLASMA  TROPONIN T, HIGH SENSITIVITY    EKG: EKG Interpretation Date/Time:  Thursday April 14 2024 12:01:38 EDT Ventricular Rate:  102 PR Interval:  158 QRS Duration:  142 QT Interval:  384 QTC Calculation: 501 R Axis:   -71  Text Interpretation: Sinus tachycardia Ventricular premature complex Left bundle branch block similar to earlier in the day Confirmed by Jerilynn Montenegro [phone]) on 04/14/2024 12:44:14 PM  Radiology: Lenell Query Chest 2 View Result Date: 04/14/2024 CLINICAL DATA:  Shortness of breath since yesterday. EXAM: CHEST - 2 VIEW COMPARISON:  12/14/2023 FINDINGS: Stable enlarged cardiac silhouette. Tortuous and partially calcified thoracic aorta. Stable mild prominence of the interstitial markings with interval minimal patchy density in the perihilar and mid lung zones bilaterally. No pleural fluid seen. Mild-to-moderate peribronchial thickening. Unremarkable bones. IMPRESSION: 1. Mild-to-moderate bronchitic changes. 2. Stable cardiomegaly and mild interstitial pulmonary edema with interval minimal bilateral perihilar and mid lung zone alveolar edema. Pneumonia is less likely but not excluded. Electronically Signed   By: Catherin Closs M.D.   On: 04/14/2024 10:46     Procedures   Medications Ordered in the ED  carvedilol  (COREG ) tablet 25 mg (25 mg Oral Given 04/14/24 1245)   sacubitril -valsartan  (ENTRESTO ) 97-103 mg per tablet (has no administration in time range)  hydrALAZINE  (APRESOLINE ) tablet 100 mg (has no administration in time range)  spironolactone  (ALDACTONE ) tablet 25 mg (25 mg Oral Not Given 04/14/24 1133)  nitroGLYCERIN  50 mg in dextrose  5 % 250 mL (0.2 mg/mL) infusion (150 mcg/min Intravenous Rate/Dose Change 04/14/24 1200)  furosemide  (LASIX ) injection 40 mg (has no administration in time range)  furosemide  (LASIX ) injection 40 mg (40 mg Intravenous Given 04/14/24 1102)  metoCLOPramide (REGLAN) injection 10 mg (10  mg Intravenous Given 04/14/24 1219)    Clinical Course as of 04/14/24 1247  Thu Apr 14, 2024  1044 Patient still with increased work of breathing even on 02, will start on bipap [AF]  1109 Reordered patient's home medications as he has not taken these today [AF]    Clinical Course User Index [AF] Rexie Catena, PA-C                                 Medical Decision Making Amount and/or Complexity of Data Reviewed Labs: ordered. Radiology: ordered.  Risk Prescription drug management. Decision regarding hospitalization.     Differential diagnosis includes but is not limited to ACS, arrhythmia, aortic aneurysm, pericarditis, myocarditis, pericardial effusion, cardiac tamponade, musculoskeletal pain, GERD, Boerhaave's syndrome, DVT/PE, pneumonia, pleural effusion, CHF exacerbation, COPD   ED Course:  Upon initial evaluation, patient is tachypneic to about 24, mildly tachycardic.  Reporting shortness of breath, but is able to talk mostly full sentences on room air.  Does drop to about 90% 02 when talking.  Will start on 2 L nasal cannula. Does not normally require oxygen at home. Hypertensive to 186/127.   Labs Ordered: I Ordered, and personally interpreted labs.  The pertinent results include:   proBNP 5937 Initial troponin elevated at 62, awaiting repeat CMP with elevated creatinine at 1.29, anion gap 18.  No elevation in  LFTs. CBC with mild leukocytosis at 10.9, hemoglobin low at 10.5.  Imaging Studies ordered: I ordered imaging studies including chest x-ray I independently visualized the imaging with scope of interpretation limited to determining acute life threatening conditions related to emergency care. Imaging showed  Mild to moderate bronchitic changes.  Stable cardiomegaly and mild interstitial pulmonary edema with mild interval minimal bilateral perihilar and midlung zone alveolar edema.  Pneumonia less likely but not excluded. I agree with the radiologist interpretation   Cardiac Monitoring: / EKG: The patient was maintained on a cardiac monitor.  I personally viewed and interpreted the cardiac monitored which showed an underlying rhythm of: Sinus tachycardia.  LVH and PVCs noted   Consultations Obtained: I requested consultation with the hospitalist Dr. Lydia Sams,  and discussed lab and imaging findings as well as pertinent plan - they recommend: Admission for CHF exacerbation  Medications Given: 40 mg Lasix  IV Reglan for nausea Patient's home medications including carvedilol , hydralazine , Entresto , spironolactone   Upon re-evaluation, patient still felt short of breath even with nasal cannula and remained tachypneic so patient was started on BiPAP.  He feels improved with BiPAP.  Most concern for CHF exacerbation at this time given elevated proBNP at 5937 and findings of pulmonary edema on chest x-ray.  Low concern for ACS at this time given no chest pain, and EKG with sinus tachycardia and no ST changes.  Initial troponin is 62, but awaiting repeat.  Lower concern for DVT or PE at this time given no lower extremity pain or swelling, no recent surgeries, hospitalizations, or periods of immobilization.  My attending Dr. Aldean Amass also evaluated patient and was involved in care.  He consulted with hospitalist Dr. Lydia Sams for admission for CHF exacerbation.  Although patient's QT was prolonged, patient was  having nausea and Dr. Aldean Amass felt he could have some Reglan.  This was given with improvement in symptoms.     Impression: CHF exacerbation  Disposition:  Admission with hospitalist Dr. Lydia Sams for further management of CHF exacerbation    Record Review: External records from  outside source obtained and reviewed including discharge summary from 12/15/2023 for review of home medications and cardiology recommendations     This chart was dictated using voice recognition software, Dragon. Despite the best efforts of this provider to proofread and correct errors, errors may still occur which can change documentation meaning.       Final diagnoses:  Acute on chronic congestive heart failure, unspecified heart failure type [facility])  Shortness of breath    ED Discharge Orders     None          Rexie Catena, PA-C 04/14/24 1247    Jerilynn Montenegro, MD 04/14/24 1446

## 2024-04-14 NOTE — ED Notes (Signed)
 RT went in to check on patient. Seems to be a bit more comfortable. Still on 15L

## 2024-04-14 NOTE — TOC CM/SW Note (Signed)
 Transition of Care [facility]) - Inpatient Brief Assessment   Patient Details  Name: Darius Rivers MRN: [id number] Date of Birth: May 22, 1961  Transition of Care [facility]) CM/SW Contact:    Juliane Och, LCSW Phone Number: 04/14/2024, 3:30 PM   Clinical Narrative:  3:30 PM Per chart review, patient resides at home alone. Patient has a PCP and insurance. Patient does not have SNF/HH/DME history. Patient's preferred pharmacy's are [id number] [place] and [facility]. No TOC needs were identified at this time. TOC will continue to follow and be available to assist.  Transition of Care Asessment: Insurance and Status: Insurance coverage has been reviewed Patient has primary care physician: Yes Home environment has been reviewed: Private Residence Prior level of function:: N/A Prior/Current Home Services: No current home services Social Drivers of Health Review: SDOH reviewed no interventions necessary Readmission risk has been reviewed: Yes Transition of care needs: no transition of care needs at this time

## 2024-04-14 NOTE — ED Notes (Signed)
Called [facility] to transport patient to [place] rm# 3

## 2024-04-14 NOTE — ED Notes (Signed)
 Nausea resolved and patient placed back on BiPAP.

## 2024-04-15 ENCOUNTER — Inpatient Hospital Stay (HOSPITAL_COMMUNITY)

## 2024-04-15 DIAGNOSIS — I5023 Acute on chronic systolic (congestive) heart failure: Secondary | ICD-10-CM | POA: Diagnosis not present

## 2024-04-15 DIAGNOSIS — R0603 Acute respiratory distress: Secondary | ICD-10-CM | POA: Diagnosis not present

## 2024-04-15 LAB — ECHOCARDIOGRAM COMPLETE
AR max vel: 2.17 cm2
AV Area VTI: 2.4 cm2
AV Area mean vel: 2.25 cm2
AV Mean grad: 4 mmHg
AV Peak grad: 7.2 mmHg
Ao pk vel: 1.34 m/s
Area-P 1/2: 4.33 cm2
Est EF: <20
S' Lateral: 6.6 cm

## 2024-04-15 LAB — RESPIRATORY PANEL BY PCR

## 2024-04-15 LAB — COMPREHENSIVE METABOLIC PANEL WITH GFR
ALT: 10 U/L (ref 0–44)
AST: 19 U/L (ref 15–41)
Albumin: 3.3 g/dL — ABNORMAL LOW (ref 3.5–5.0)
Alkaline Phosphatase: 53 U/L (ref 38–126)
Anion gap: 12 (ref 5–15)
BUN: 22 mg/dL (ref 8–23)
CO2: 20 mmol/L — ABNORMAL LOW (ref 22–32)
Calcium: 9 mg/dL (ref 8.9–10.3)
Chloride: 105 mmol/L (ref 98–111)
Creatinine, Ser: 1.47 mg/dL — ABNORMAL HIGH (ref 0.61–1.24)
GFR, Estimated: 54 mL/min — ABNORMAL LOW (ref >60)
Glucose, Bld: 145 mg/dL — ABNORMAL HIGH (ref 70–99)
Potassium: 3.7 mmol/L (ref 3.5–5.1)
Sodium: 137 mmol/L (ref 135–145)
Total Bilirubin: 1.1 mg/dL (ref 0.0–1.2)
Total Protein: 6.4 g/dL — ABNORMAL LOW (ref 6.5–8.1)

## 2024-04-15 LAB — GLUCOSE, CAPILLARY
Glucose-Capillary: 152 mg/dL — ABNORMAL HIGH (ref 70–99)
Glucose-Capillary: 129 mg/dL — ABNORMAL HIGH (ref 70–99)
Glucose-Capillary: 132 mg/dL — ABNORMAL HIGH (ref 70–99)
Glucose-Capillary: 128 mg/dL — ABNORMAL HIGH (ref 70–99)

## 2024-04-15 LAB — LIPID PANEL
Cholesterol: 186 mg/dL (ref 0–200)
HDL: 34 mg/dL — ABNORMAL LOW (ref >40)
LDL Cholesterol: 141 mg/dL — ABNORMAL HIGH (ref 0–99)
Total CHOL/HDL Ratio: 5.5 ratio
Triglycerides: 54 mg/dL (ref <150)
VLDL: 11 mg/dL (ref 0–40)

## 2024-04-15 LAB — MAGNESIUM: Magnesium: 2 mg/dL (ref 1.7–2.4)

## 2024-04-15 LAB — CBC
HCT: 29.5 % — ABNORMAL LOW (ref 39.0–52.0)
Hemoglobin: 9.8 g/dL — ABNORMAL LOW (ref 13.0–17.0)
MCH: 29.9 pg (ref 26.0–34.0)
MCHC: 33.2 g/dL (ref 30.0–36.0)
MCV: 89.9 fL (ref 80.0–100.0)
Platelets: 306 10*3/uL (ref 150–400)
RBC: 3.28 MIL/uL — ABNORMAL LOW (ref 4.22–5.81)
RDW: 16.7 % — ABNORMAL HIGH (ref 11.5–15.5)
WBC: 9.5 10*3/uL (ref 4.0–10.5)
nRBC: 0 % (ref 0.0–0.2)

## 2024-04-15 MED ORDER — TECHNETIUM TO 99M ALBUMIN AGGREGATED
4.4000 | Freq: Once | INTRAVENOUS | Status: AC | PRN
  Administered 2024-04-15: 4.4 via INTRAVENOUS

## 2024-04-15 MED ORDER — EMPAGLIFLOZIN 10 MG PO TABS: 10.0000 mg | ORAL_TABLET | Freq: Every day | ORAL | Status: DC

## 2024-04-15 MED ORDER — ENSURE PLUS HIGH PROTEIN PO LIQD
237.0000 mL | Freq: Two times a day (BID) | ORAL | Status: DC
  Administered 2024-04-16 – 2024-04-19 (×7): 237 mL via ORAL

## 2024-04-15 MED ORDER — ADULT MULTIVITAMIN W/MINERALS CH
1.0000 | ORAL_TABLET | Freq: Every day | ORAL | Status: DC
  Administered 2024-04-15 – 2024-04-19 (×5): 1 via ORAL
  Filled 2024-04-15 (×5): qty 1

## 2024-04-15 MED ORDER — EMPAGLIFLOZIN 10 MG PO TABS
10.0000 mg | ORAL_TABLET | Freq: Every day | ORAL | Status: DC
  Administered 2024-04-15 – 2024-04-19 (×5): 10 mg via ORAL
  Filled 2024-04-15 (×5): qty 1

## 2024-04-15 MED ORDER — PERFLUTREN LIPID MICROSPHERE
1.0000 mL | INTRAVENOUS | Status: AC | PRN
  Administered 2024-04-15: 2 mL via INTRAVENOUS

## 2024-04-15 NOTE — Progress Notes (Addendum)
 Progress Note  Patient Name: Darius Rivers Date of Encounter: 04/15/2024  [facility] Cardiologist: Jann Melody, MD   Patient Profile     Subjective   Shortness of breath improved.  No further nausea or vomiting.  Inpatient Medications    Scheduled Meds:  aspirin  EC  81 mg Oral Daily   carvedilol   25 mg Oral BID   ezetimibe   10 mg Oral Daily   furosemide   40 mg Intravenous Q12H   heparin   5,000 Units Subcutaneous Q8H   hydrALAZINE   100 mg Oral TID   rosuvastatin   40 mg Oral q1800   sacubitril -valsartan   1 tablet Oral BID   sodium chloride  flush  3 mL Intravenous Q12H   spironolactone   25 mg Oral Daily   Continuous Infusions:  nitroGLYCERIN  200 mcg/min (04/15/24 1036)   PRN Meds: acetaminophen  **OR** acetaminophen , bisacodyl , hydrALAZINE , polyethylene glycol   Vital Signs    Vitals:   04/15/24 0726 04/15/24 0848 04/15/24 0849 04/15/24 0851  BP: 100/77 100/77    Pulse: 84 (!) 102 68 79  Resp: 20     Temp: 97.8 F (36.6 C)     TempSrc: Oral     SpO2: 97% 100% 100% 99%  Weight:      Height:        Intake/Output Summary (Last 24 hours) at 04/15/2024 1051 Last data filed at 04/15/2024 0700 Gross per 24 hour  Intake 240 ml  Output 2175 ml  Net -1935 ml      04/15/2024    4:28 AM 04/14/2024    9:57 AM 12/23/2023    8:47 AM  Last 3 Weights  Weight (lbs) 152 lb 1.9 oz 179 lb 164 lb 12.8 oz  Weight (kg) 69 kg 81.194 kg 74.753 kg      Telemetry    Normal sinus rhythm with occasional NSVT on telemetry last night and overnight- Personally Reviewed  ECG    No new EKG to review- Personally Reviewed  Physical Exam   GEN: No acute distress.   Neck: No JVD Cardiac: RRR, no murmurs, rubs, or gallops.  Respiratory: Clear to auscultation bilaterally. GI: Soft, nontender, non-distended  MS: No edema; No deformity. Neuro:  Nonfocal  Psych: Normal affect   Labs    High Sensitivity Troponin:  No results for input(s): TROPONINIHS in the  last 720 hours.    Chemistry Recent Labs  Lab 04/14/24 1018 04/14/24 1736 04/15/24 0343  NA 143 140 137  K 3.8 3.8 3.7  CL 106 109 105  CO2 19* 20* 20*  GLUCOSE 123* 125* 145*  BUN 18 21 22   CREATININE 1.29* 1.53* 1.47*  CALCIUM  10.0 9.4 9.0  PROT 8.3* 6.9 6.4*  ALBUMIN 4.7 3.5 3.3*  AST 19 17 19   ALT 9 10 10   ALKPHOS 76 54 53  BILITOT 0.9 1.1 1.1  GFRNONAA >60 51* 54*  ANIONGAP 18* 11 12     Hematology Recent Labs  Lab 04/14/24 1018 04/14/24 1736 04/15/24 0343  WBC 10.9* 12.6* 9.5  RBC 3.46* 3.21* 3.28*  HGB 10.5* 9.7* 9.8*  HCT 31.6* 29.4* 29.5*  MCV 91.3 91.6 89.9  MCH 30.3 30.2 29.9  MCHC 33.2 33.0 33.2  RDW 17.2* 17.1* 16.7*  PLT 319 320 306    BNP Recent Labs  Lab 04/14/24 1018  PROBNP 5,937.0*     DDimer No results for input(s): DDIMER in the last 168 hours.    Radiology    CT CHEST WO CONTRAST Result Date:  04/14/2024 CLINICAL DATA:  Respiratory distress, failure, shortness of breath EXAM: CT CHEST WITHOUT CONTRAST TECHNIQUE: Multidetector CT imaging of the chest was performed following the standard protocol without IV contrast. RADIATION DOSE REDUCTION: This exam was performed according to the departmental dose-optimization program which includes automated exposure control, adjustment of the mA and/or kV according to patient size and/or use of iterative reconstruction technique. COMPARISON:  12/11/2022 FINDINGS: Cardiovascular: Heart is borderline in size. Aorta is normal caliber. Moderate calcifications in the coronary arteries most pronounced in the left anterior descending and right coronary arteries. Moderate aortic atherosclerosis. Mediastinum/Nodes: No mediastinal, hilar, or axillary adenopathy. Trachea and esophagus are unremarkable. Thyroid  unremarkable. Lungs/Pleura: Paraseptal emphysema. Scattered ground-glass opacities bilaterally, left greater than right, new since prior study. No effusions. Upper Abdomen: No acute findings  Musculoskeletal: Chest wall soft tissues are unremarkable. No acute bony abnormality. IMPRESSION: Scattered bilateral ground-glass airspace opacities, left greater than right. Favor infectious/inflammatory process. Atypical infection is possible. Early asymmetric edema could have this appearance. Coronary artery disease.  Borderline cardiomegaly. Aortic Atherosclerosis (ICD10-I70.0). Electronically Signed   By: Janeece Mechanic M.D.   On: 04/14/2024 21:30   DG Chest 2 View Result Date: 04/14/2024 CLINICAL DATA:  Shortness of breath since yesterday. EXAM: CHEST - 2 VIEW COMPARISON:  12/14/2023 FINDINGS: Stable enlarged cardiac silhouette. Tortuous and partially calcified thoracic aorta. Stable mild prominence of the interstitial markings with interval minimal patchy density in the perihilar and mid lung zones bilaterally. No pleural fluid seen. Mild-to-moderate peribronchial thickening. Unremarkable bones. IMPRESSION: 1. Mild-to-moderate bronchitic changes. 2. Stable cardiomegaly and mild interstitial pulmonary edema with interval minimal bilateral perihilar and mid lung zone alveolar edema. Pneumonia is less likely but not excluded. Electronically Signed   By: Catherin Closs M.D.   On: 04/14/2024 10:46    Patient Profile     63 y.o. male with a hx of HFrEF, nonischemic cardiomyopathy, CAD status post STEMI in 2020, moderate to severe aortic insufficiency, hypertension, nonsustained VT, aneurysm of the ascending aorta, CKD, tobacco use, and hyperlipidemia who is being seen 04/14/2024 for the evaluation of congestive heart failure at the request of Brunilda Capra MD.    Assessment & Plan    #Acute on chronic HFrEF #NICM - He has a history of moderate LV dysfunction with last echo showing EF 35 to 40% with global HK - Cath 2022 with minimal CAD mid LAD 15% stenosis consistent with an ICM - Last hospitalization for acute CHF exacerbation was 12/2023 at which time he had stopped taking his medicines -Had onset of  shortness of breath the day prior to admission with PND and an episode of nausea and vomiting.  No chest pain. - Seen in ER with hypertensive urgency and elevated pro BNP at 5937 - Chest x-ray consistent with pulmonary edema - Start on IV Lasix  40 mg twice daily - Urine output 2.1 L yesterday and net -1.9 L since admission - weight down 28lbs from admit (? Accuracy) - Tells me that his blood pressures have been running in the 150/80s at home and that he does admit to salting his food some - He has been compliant with his medications - Serum creatinine improved from yesterday (1.53>> 1.47) - Continue Lasix  40 mg IV twice daily - Follow strict I's and O's, daily weights and renal function while diuresing - Replete K+ to keep >4  and Mag > 2 - GDMT:: Continue carvedilol  25 mg twice daily  Continue hydralazine  100 mg 3 times daily Continue with Entresto  97-103 mg  twice daily Continue spironolactone  25 mg daily Hold Jardiance  until BP improves DC the NTG drip Restart Imdur  30 mg daily if BP improves> currently BP soft at 100/9mmHg after am meds (was on 90 mg BID PTA) - Bmet in a.m. - 2D echo pending   #Hypertensive urgency - He is on multiple antihypertensive medications which he states he has been compliant with - He is have been running high at home - Admits to noncompliance at times with sodium intake - BP much improved today - Continue carvedilol  25 mg twice daily, hydralazine  100 mg 3 times daily, Entresto  97-103 mg twice daily, spironolactone  25 mg daily -Discontinue IV nitro drip -Will start back on Imdur  30 mg daily if BP improves after stopping IV NTG gtt and titrate as needed for blood pressure control (PTA was on 90 mg twice daily)   #ASCAD #HLD #Elevated troponin - Status post STEMI 2020 with cath showing 15% mid LAD otherwise normal coronary arteries - Denies any chest pain - hsTrop minimally elevated with flat trend at 62>52>> not consistent with ACS and most consistent  with demand ischemia in the setting of acute CHF exacerbation and hypertensive urgency - 2D echo pending - Continue on ASA 81 mg daily, Zetia  10 mg daily, Crestor  40 mg daily  - d/c IV NTG and restart Imdur  as BP allows  #Ascending aortic aneurysm - 45 mm on CMR - Yearly follow-up   I spent 35 minutes caring for this patient today face to face, ordering and reviewing labs, reviewing records from 2D echo from 09/2023 and cath from 02/2021 seeing the patient, documenting in the record, and arranging for a 2D echo  For questions or updates, please contact [facility] Please consult [email or web address] for contact info under        Signed, Gaylyn Keas, MD  04/15/2024, 10:51 AM

## 2024-04-15 NOTE — Plan of Care (Signed)
   Problem: Education: Goal: Knowledge of General Education information will improve Description: Including pain rating scale, medication(s)/side effects and non-pharmacologic comfort measures Outcome: Progressing   Problem: Clinical Measurements: Goal: Respiratory complications will improve Outcome: Progressing Goal: Cardiovascular complication will be avoided Outcome: Progressing

## 2024-04-15 NOTE — Progress Notes (Addendum)
 Progress Note   Patient: Darius Rivers NWG:[id number] DOB: Jan 07, 1961 DOA: 04/14/2024     1 DOS: the patient was seen and examined on 04/15/2024   Brief hospital course: Darius Rivers is a 63 y.o. male with past medical history  of  HFrEF, aneurysm of ascending aorta without rupture, aortic insufficiency, coronary artery disease/NSTEMI in 2020 January,, hypertension, hyperlipidemia, prostate cancer, CKD, presents with concern for shortness of breath ongoing for the past day.  The emergency room patient was found to have proBNP 5937 as well as chest x-ray showing pulmonary vascular congestion concerning for acute on chronic heart failure exacerbation.  Patient currently undergoing diuresis with cardiologist input  Assessment and Plan: Acute hypoxic respiratory failure secondary to acute on chronic HFrEF Patient presented with proBNP 5935 Chest x-ray showing findings of pulmonary vascular congestion Patient's respiratory function improved with IV Lasix  Continue IV Lasix  Echocardiogram showing EF less than 20% Continue strict I's and O's Cardiology on board and case discussed we appreciate input Monitor daily weight VQ scan showed no evidence of PE Continue Entresto , spironolactone  carvedilol , Jardiance  as recommended by cardiology  Mildly elevated troponin in the setting of CHF exacerbation as well as uncontrolled hypertension Cardiology on board Currently patient denies any chest pains Continue aspirin , carvedilol , Crestor    Hypertensive urgency: Patient is continued on Coreg , hydralazine , Entresto , IV hydralazine  as needed.   Coronary artery disease status post STEMI in 2020 We will continue patient on Crestor , aspirin .   Hyperlipidemia-continue statin therapy  Ascending aortic aneurysm-continue outpatient follow-up  CKD stage IIIa: Creatinine currently at baseline Continue to monitor renal function closely     DVT prophylaxis:  Heparin  q8h.  Consults:  Cardiology.      Code Status: Full Code    Family Communication: Discussed with family at bedside  Disposition: Pending clinical course    Subjective:  Patient seen and examined at bedside this morning She admits to significant improvement in her respiratory function Denies worsening chest pain nausea vomiting abdominal pain or urinary complaints  Physical Exam: General: Middle-age male laying in bed in no acute distress currently on 3 L of intranasal oxygen however uses none at home Cardiovascular:     Rate and Rhythm: Normal rate and regular rhythm.  Pulmonary: Decreased air entry bilaterally at the bases Abdominal:     General: Bowel sounds are normal. There is no distension.  Musculoskeletal: Minimal bilateral lower extremity pitting edema   Skin:    General: Skin is warm.    Neurological:     General: No focal deficit present.  Psychiatric:        Attention and Perception: Attention normal.          Vitals:   04/15/24 0851 04/15/24 1100 04/15/24 1626 04/15/24 1628  BP:  118/73 (!) 152/97 (!) 146/90  Pulse: 79 78 (!) 106 87  Resp:  (!) 22 (!) 22   Temp:  97.9 F (36.6 C) 97.8 F (36.6 C)   TempSrc:  Oral Oral   SpO2: 99% 100% 98% 96%  Weight:      Height:        Data Reviewed: Chest x-ray reviewed showing findings of pulmonary vascular congestion    Latest Ref Rng & Units 04/15/2024    3:43 AM 04/14/2024    5:36 PM 04/14/2024   10:18 AM  CBC  WBC 4.0 - 10.5 K/uL 9.5  12.6  10.9   Hemoglobin 13.0 - 17.0 g/dL 9.8  9.7  57.8   Hematocrit 39.0 - 52.0 %  29.5  29.4  31.6   Platelets 150 - 400 K/uL 306  320  319        Latest Ref Rng & Units 04/15/2024    3:43 AM 04/14/2024    5:36 PM 04/14/2024   10:18 AM  BMP  Glucose 70 - 99 mg/dL 161  096  045   BUN 8 - 23 mg/dL 22  21  18    Creatinine 0.61 - 1.24 mg/dL 4.09  8.11  9.14   Sodium 135 - 145 mmol/L 137  140  143   Potassium 3.5 - 5.1 mmol/L 3.7  3.8  3.8   Chloride 98 - 111 mmol/L 105  109  106   CO2 22 - 32 mmol/L  20  20  19    Calcium  8.9 - 10.3 mg/dL 9.0  9.4  78.2      Time spent: 56 minutes  Author: Ezzard Holms, MD 04/15/2024 5:19 PM  For on call review [email or web address].

## 2024-04-15 NOTE — Progress Notes (Incomplete)
 Heart Failure Nurse Navigator Progress Note  PCP: Alejo Amsler, PA PCP-Cardiologist: *** Admission Diagnosis: *** Admitted from: ***  Presentation:   Darius Rivers presented with ***  ECHO/ LVEF: ***  Clinical Course:  Past Medical History:  Diagnosis Date   Chronic kidney disease stage II    Coronary artery disease    NSTEMI in 11/2018 - Lg scar on Myoview ; Rx medically due to CKD // Cath 4/22: mLAD 15 (no sig CAD)   HFrEF (heart failure with reduced EF)    NICM // Echocardiogram 11/2018: EF 40-45 // Echocardiogram 4/22:  EF 25-30 // Echo 7/22: EF 40-45, global HK normal RVSF, mild LAE, mild MR, moderate AI, mild AV sclerosis without stenosis, mild dilation of aortic root (42 mm)   Hyperlipidemia    Hypertension    Lung nodule    CT 7/22: Left upper lobe 3 mm> repeat chest CT in 1 year   Nephrolithiasis    NSVT (nonsustained ventricular tachycardia)    Prostate cancer (HCC)    watchful waiting, due to go back to urology   Thoracic ascending aortic aneurysm    Echo 4/22: 44 mm // Chest CTA 7/22: Ascending thoracic aortic aneurysm 4.4 cm.  3 mm left upper lobe nodule.  Aortic atherosclerosis.     Social History   Socioeconomic History   Marital status: Divorced    Spouse name: Not on file   Number of children: 2   Years of education: Not on file   Highest education level: High school graduate  Occupational History   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  Tobacco Use   Smoking status: Former    Current packs/day: 0.00    Average packs/day: 0.3 packs/day for 20.0 years (6.6 ttl pk-yrs)    Types: Cigarettes    Start date: 11/11/1978    Quit date: 11/11/1998    Years since quitting: 25.4   Smokeless tobacco: Never  Vaping Use   Vaping status: Never Used  Substance and Sexual Activity   Alcohol use: Not Currently   Drug use: Yes    Types: Marijuana    Comment: 2 x per month    Sexual activity: Yes  Other Topics Concern   Not on file  Social History Narrative   Not on file   Social Drivers of Health   Financial Resource Strain: Low Risk  (12/15/2023)   Overall Financial Resource Strain (CARDIA)    Difficulty of Paying Living Expenses: Not very hard  Food Insecurity: No Food Insecurity (04/14/2024)   Hunger Vital Sign    Worried About Running Out of Food in the Last Year: Never true    Ran Out of Food in the Last Year: Never true  Transportation Needs: No Transportation Needs (04/14/2024)   PRAPARE - Administrator, Civil Service (Medical): No    Lack of Transportation (Non-Medical): No  Physical Activity: Not on file  Stress: Not on file  Social Connections: Socially Isolated (04/14/2024)   Social Connection and Isolation Panel    Frequency of Communication with Friends and Family: Three times a week    Frequency of Social Gatherings with Friends and Family: More than three times a week    Attends Religious Services: Never    Database administrator or Organizations: No    Attends Banker Meetings: Never    Marital Status: Widowed    High Risk Criteria  for Readmission and/or Poor Patient Outcomes: Heart failure hospital admissions (last 6 months): ***  No Show rate: *** Difficult social situation: *** Demonstrates medication adherence: *** Primary Language: *** Literacy level: ***  Barriers of Care:   ***  Considerations/Referrals:   Referral made to Heart Failure Pharmacist Stewardship: *** Referral made to Heart Failure CSW/NCM TOC: *** Referral made to Heart & Vascular TOC clinic: ***  Items for Follow-up on DC/TOC: ***   ***

## 2024-04-15 NOTE — Progress Notes (Addendum)
 Initial Nutrition Assessment  DOCUMENTATION CODES:   Severe malnutrition in context of chronic illness  INTERVENTION:  Add Ensure Plus High Protein po BID, each supplement provides 350 kcal and 20 grams of protein  Add Magic cup TID with meals, each supplement provides 290 kcal and 9 grams of protein  Add MVI w/ minerals Education around strategies to prevent fluid overload and readmission Collect new weight to assess true body weight trend   NUTRITION DIAGNOSIS:  Severe Malnutrition related to chronic illness (CKD, HFrEF) as evidenced by severe muscle depletion, severe fat depletion.  GOAL:  Patient will meet greater than or equal to 90% of their needs  MONITOR:  PO intake, Supplement acceptance  REASON FOR ASSESSMENT:  Consult Assessment of nutrition requirement/status  ASSESSMENT:   Pt with PMH significant for: HFrEF, aneurysm of ascending aorta, CAD/NSTEMI (2020), HLD, HTN, prostate CA, CKD. Presented to ED w/ SOB and found in respiratory distress and acute hypoxic respiratory failure 2/2 pulmonary vascular congestion and interstitial edema most likely attributable to CHF dx.  Remains on oxygen. Echo pending. Nausea improved. Denies chest pain.  Average Meal Intake No documented meal intake to review  Patient pleasant and conversant at bedside today. 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 Lunch and supper are usually provided as it is for the student athletes. He is able to partake in the meals provided as well.  24 Hour Recall B: eggs, grits, and coffee L: protein, starch, veg w/ water D: protein, starch, veg w/ water Snacks: graham crackers; usually consumes 1-2 protein supplements per day  He reports diminished appetite/intake one week leading  up to admission, however this is questionable as evidenced by his degree of muscle and fat wasting one exam. Discussed importance of calorie and protein intake while admitted and on discharge.  Discussed strategies to avoid over consuming fluids and how to manage fluid status outside of that. He verbalizes understanding. Add Ensure Plus High Protein to augment intake.   Admit Weight: 81.2kg   Current Weight: 69kg - ? accuracy Questionable trend, however patient diuresing. Will establish trend s/p next weight collection  Patient with no edema on exam. He endorses UBW around 180lbs, however last weight that a year or so ago. Per chart review, he has shown 19.7% weight loss in last six months. This is considered clinically significant for the time frame reviewed. Of note, most recent weight collected significantly lower than admission weight. Will await new weight collection to solidify trend and true body weight. Because of this, will disregard as criteria for malnutrition dx.   Based off nutrition-focused physical exam alone, patient does meet criteria for severe malnutrition in the context of chronic illness.    Intake/Output Summary (Last 24 hours) at 04/15/2024 1506 Last data filed at 04/15/2024 1045 Gross per 24 hour  Intake 240 ml  Output 1675 ml  Net -1435 ml    Net IO Since Admission: -2,085 mL [04/15/24 1506]   Crt trending down. Potassium and magnesium  supplemented today. Modified diuretic to oral spironolactone .   Meds: furosemide , spironolactone   Labs:  Crt 1.53>1.47 (h) CBGs 125-145 x24 hours A1c 5.5 (04/2024)    NUTRITION - FOCUSED PHYSICAL EXAM:  Flowsheet Row Most Recent Value  Orbital Region Mild depletion  Upper Arm Region Severe depletion  Thoracic and Lumbar Region Severe depletion  Buccal Region Moderate depletion  Temple Region Moderate depletion  Clavicle Bone Region Severe depletion  Clavicle and Acromion Bone Region Severe depletion  Scapular Bone Region  Moderate depletion  Dorsal Hand Moderate depletion  Patellar Region Moderate depletion  Anterior Thigh Region Moderate depletion  Posterior Calf Region Moderate depletion  Edema (RD  Assessment) None  Hair Reviewed  Eyes Reviewed  Mouth Reviewed  Skin Reviewed  Nails Reviewed    Diet Order:   Diet Order             Diet Heart Room service appropriate? Yes; Fluid consistency: Thin; Fluid restriction: 1500 mL Fluid  Diet effective now            EDUCATION NEEDS:   Education needs have been addressed  Skin:  Skin Assessment: Reviewed RN Assessment  Last BM:  6/11 - PTA  Height:  Ht Readings from Last 1 Encounters:  04/14/24 6' 4.5 (1.943 m)   Weight:  Wt Readings from Last 1 Encounters:  04/15/24 69 kg   Ideal Body Weight:  92.9 kg  BMI:  Body mass index is 18.28 kg/m.  Estimated Nutritional Needs:   Kcal:  1800-2000 kcals  Protein:  95-110g  Fluid:  >1.8L/day  Con Decant MS, RD, LDN Registered Dietitian Clinical Nutrition RD Inpatient Contact Info in Amion

## 2024-04-15 NOTE — Plan of Care (Signed)
   Problem: Education: Goal: Knowledge of General Education information will improve Description Including pain rating scale, medication(s)/side effects and non-pharmacologic comfort measures Outcome: Progressing

## 2024-04-15 NOTE — Progress Notes (Signed)
 OT Cancellation Note  Patient Details Name: Darius Rivers MRN: [id number] DOB: 1961-03-17   Cancelled Treatment:    Reason Eval/Treat Not Completed: OT screened, no needs identified, will sign off  Jonette Nestle 04/15/2024, 3:49 PM Avanell Leigh, OTR/L Acute Rehabilitation Services Office: [phone]

## 2024-04-15 NOTE — Progress Notes (Signed)
   Heart Failure Stewardship Pharmacist Progress Note   PCP: Alejo Amsler, PA PCP-Cardiologist: Jann Melody, MD    HPI:  77 YOM with PMH of HFrEF, aneurysm of ascending aorta without rupture, aortic insufficiency, CAD/NSTEMI 11/2018, HTN, HLD, prostate cancer, CKD.  Presented to ED with SOB over past days.  Cardiac MRI 12/2023 showed severe increase in LV size, normal LV thickness, LVEF 27%, late gadolinium enhancement in LV myocardium. Mild RV dilation, RVEF 42%. Moderate aortic regurgitation. Moderate aortic aneurysm with mild aortic dilation.  Previous ECHO 09/2023 showed LVEF 35-40%.  Upon physical exam, patient denies SOB, chest pain / tightness. No lower extremity edema. Patient states he is able to get up and walk to bathroom without discomfort.   Current HF Medications: Diuretic: furosemide  40 mg IV BID Beta Blocker: carvedilol  25 mg BID ACE/ARB/ARNI: Entresto  97/103 mg BID MRA: spironolactone  25 mg daily Other: hydralazine  100 mg TID  Prior to admission HF Medications: Diuretic: furosemide  40 mg PO PRN weight gain  Beta blocker: carvedilol  25 mg BID ACE/ARB/ARNI: Entresto  97/103 mg BID  MRA: spironolactone  25 mg daily SGLT2i: Jardiance  10 mg daily  Other: hydralazine  100 mg TID  Other: isosorbide  mononitrate 30 mg, 3 tabs (90 mg) BID  Pertinent Lab Values: Serum creatinine 1.47, BUN 22, Potassium 3.7, Sodium 137, ProBNP 5937, Magnesium  2.0, A1c 5.5  Vital Signs: Weight: 152 lbs (admission weight: 179 lbs - likely esitmate) Blood pressure: 135-145/80-90's  Heart rate: 85  I/O: net -1.3L yesterday; net -1.6L since admission  Medication Assistance / Insurance Benefits Check: Does the patient have prescription insurance?  Yes Type of insurance plan: [employer]  Does the patient qualify for medication assistance through manufacturers or grants?   No  Outpatient Pharmacy:  Prior to admission outpatient pharmacy: [facility], [place]. [address] Is the patient willing to use [facility] TOC pharmacy at discharge? Yes Is the patient willing to transition their outpatient pharmacy to utilize a [facility] outpatient pharmacy?   No  Assessment: 1. Acute on chronic systolic CHF (LVEF 27%), due to presumed NICM. NYHA class III symptoms. - Continue furosemide  40 mg IV BID, consider dose increase if appears volume overload  - Continue carvedilol  25 mg BID - Continue Entresto  97/103 mg BID - Continue spironolactone  25 mg daily - Continue hydralazine  100 mg TID, if pt becomes hypotensive consider holding.  - Daily weight. Strict I/O's - Keep K > 4, Mg > 2   Plan: 1) Medication changes recommended at this time: - Recommend Jardiance  10 mg daily - home dose  - Monitor BP, if hypotensive consider holding hydralazine    2) Patient assistance: - Patient states co-pays are affordable, no assistance needed   3)  Education  - Initial education provided.  - To be completed prior to discharge.  Winnie Haver, PharmD Candidate [phone] [facility] 04/15/2024 1:16 PM

## 2024-04-15 NOTE — Evaluation (Signed)
 Physical Therapy Evaluation Patient Details Name: Darius Rivers MRN: [id number] DOB: [birth year] Today's Date: 04/15/2024  History of Present Illness  63 y.o. male presents to [facility] 04/14/24 with SOB. Admitted with acute hypoxic respiratory failure 2/2 pulmonary vascular congestion interstitial edema likely from CHF. PMHx: HFrEF, aneurysm of ascending aorta without rupture, aortic insufficiency, CAD/NSTEMI in 2020, hypertension, hyperlipidemia, prostate cancer, CKD   Clinical Impression  Pt in bed upon arrival and agreeable to PT eval. PTA, pt was independent for mobility with no AD and working full time. In today's session, pt was independent with walking 250 ft and ascending/descending 10 steps with ModI and no HR. Pt reports being at functional mobility baseline with no further questions or concerns. Pt has no further acute or post-acute PT needs. Acute PT signing off.   SpO2 >92% on RA during session        If plan is discharge home, recommend the following: Help with stairs or ramp for entrance   Can travel by private vehicle    Yes    Equipment Recommendations None recommended by PT     Functional Status Assessment Patient has had a recent decline in their functional status and demonstrates the ability to make significant improvements in function in a reasonable and predictable amount of time.     Precautions / Restrictions Precautions Precautions: Fall Restrictions Weight Bearing Restrictions Per Provider Order: No      Mobility  Bed Mobility Overal bed mobility: Independent     Transfers Overall transfer level: Independent Equipment used: None     Ambulation/Gait Ambulation/Gait assistance: Independent Gait Distance (Feet): 250 Feet Assistive device: None Gait Pattern/deviations: WFL(Within Functional Limits)    General Gait Details: decr  Stairs Stairs: Yes Stairs assistance: Modified independent (Device/Increase time) Stair Management: No rails,  Forwards, Alternating pattern, Step to pattern Number of Stairs: 10 General stair comments: alternating pattern to ascend with no HR, step-to to descend     Balance Overall balance assessment: Independent         Pertinent Vitals/Pain Pain Assessment Pain Assessment: No/denies pain    Home Living Family/patient expects to be discharged to:: Private residence Living Arrangements: Alone Available Help at Discharge: Family;Available PRN/intermittently Type of Home: Apartment Home Access: Stairs to enter Entrance Stairs-Rails: None Entrance Stairs-Number of Steps: 6   Home Layout: One level Home Equipment: None      Prior Function Prior Level of Function : Independent/Modified Independent;Driving;Working/employed    Mobility Comments: Ind with no AD ADLs Comments: Works as Printmaker, Chief Operating Officer Extremity Assessment Upper Extremity Assessment: Defer to OT evaluation    Lower Extremity Assessment Lower Extremity Assessment: Overall WFL for tasks assessed    Cervical / Trunk Assessment Cervical / Trunk Assessment: Normal  Communication   Communication Communication: No apparent difficulties    Cognition Arousal: Lethargic Behavior During Therapy: WFL for tasks assessed/performed   PT - Cognitive impairments: No apparent impairments    PT - Cognition Comments: Initially nodding off during questions, increased alertness after sitting on EOB Following commands: Intact       Cueing Cueing Techniques: Verbal cues      PT Assessment Patient does not need any further PT services   AM-PAC PT 6 Clicks Mobility  Outcome Measure Help needed turning from your back to your side while in a flat bed without using bedrails?: None Help needed moving from lying on your back to sitting on the side of a flat bed  without using bedrails?: None Help needed moving to and from a bed to a chair (including a wheelchair)?: None Help needed  standing up from a chair using your arms (e.g., wheelchair or bedside chair)?: None Help needed to walk in hospital room?: None Help needed climbing 3-5 steps with a railing? : None 6 Click Score: 24    End of Session   Activity Tolerance: Patient tolerated treatment well Patient left: in chair;with call bell/phone within reach Nurse Communication: Mobility status PT Visit Diagnosis: Other abnormalities of gait and mobility (R26.89)    Time: 4098-1191 PT Time Calculation (min) (ACUTE ONLY): 17 min   Charges:   PT Evaluation $PT Eval Low Complexity: 1 Low   PT General Charges $$ ACUTE PT VISIT: 1 Visit        Orysia Blas, PT, DPT Secure Chat Preferred  Rehab Office [phone]   Alissa April Adela Ades 04/15/2024, 2:08 PM

## 2024-04-16 DIAGNOSIS — R0603 Acute respiratory distress: Secondary | ICD-10-CM

## 2024-04-16 DIAGNOSIS — E43 Unspecified severe protein-calorie malnutrition: Secondary | ICD-10-CM | POA: Insufficient documentation

## 2024-04-16 LAB — CBC
HCT: 36.6 % — ABNORMAL LOW (ref 39.0–52.0)
Hemoglobin: 12.2 g/dL — ABNORMAL LOW (ref 13.0–17.0)
MCH: 29.8 pg (ref 26.0–34.0)
MCHC: 33.3 g/dL (ref 30.0–36.0)
MCV: 89.5 fL (ref 80.0–100.0)
Platelets: 366 10*3/uL (ref 150–400)
RBC: 4.09 MIL/uL — ABNORMAL LOW (ref 4.22–5.81)
RDW: 17.2 % — ABNORMAL HIGH (ref 11.5–15.5)
WBC: 9.1 10*3/uL (ref 4.0–10.5)
nRBC: 0 % (ref 0.0–0.2)

## 2024-04-16 LAB — GLUCOSE, CAPILLARY: Glucose-Capillary: 117 mg/dL — ABNORMAL HIGH (ref 70–99)

## 2024-04-16 LAB — BASIC METABOLIC PANEL WITH GFR
Anion gap: 13 (ref 5–15)
BUN: 28 mg/dL — ABNORMAL HIGH (ref 8–23)
CO2: 22 mmol/L (ref 22–32)
Calcium: 9.4 mg/dL (ref 8.9–10.3)
Chloride: 101 mmol/L (ref 98–111)
Creatinine, Ser: 1.53 mg/dL — ABNORMAL HIGH (ref 0.61–1.24)
GFR, Estimated: 51 mL/min — ABNORMAL LOW (ref >60)
Glucose, Bld: 115 mg/dL — ABNORMAL HIGH (ref 70–99)
Potassium: 3.4 mmol/L — ABNORMAL LOW (ref 3.5–5.1)
Sodium: 136 mmol/L (ref 135–145)

## 2024-04-16 LAB — MAGNESIUM: Magnesium: 1.9 mg/dL (ref 1.7–2.4)

## 2024-04-16 MED ORDER — POTASSIUM CHLORIDE 20 MEQ PO PACK
40.0000 meq | PACK | Freq: Two times a day (BID) | ORAL | Status: DC
  Administered 2024-04-16 – 2024-04-17 (×4): 40 meq via ORAL
  Filled 2024-04-16 (×4): qty 2

## 2024-04-16 MED ORDER — POTASSIUM CHLORIDE CRYS ER 20 MEQ PO TBCR
60.0000 meq | EXTENDED_RELEASE_TABLET | Freq: Once | ORAL | Status: AC
  Administered 2024-04-16: 60 meq via ORAL
  Filled 2024-04-16: qty 3

## 2024-04-16 MED ORDER — POLYETHYLENE GLYCOL 3350 17 G PO PACK: 17.0000 g | PACK | Freq: Two times a day (BID) | ORAL | Status: DC | PRN

## 2024-04-16 MED ORDER — SENNOSIDES-DOCUSATE SODIUM 8.6-50 MG PO TABS
2.0000 | ORAL_TABLET | Freq: Once | ORAL | Status: AC
  Administered 2024-04-16: 2 via ORAL
  Filled 2024-04-16: qty 2

## 2024-04-16 MED ORDER — POLYETHYLENE GLYCOL 3350 17 G PO PACK
17.0000 g | PACK | Freq: Once | ORAL | Status: AC
  Administered 2024-04-16: 17 g via ORAL
  Filled 2024-04-16: qty 1

## 2024-04-16 MED ORDER — FUROSEMIDE 10 MG/ML IJ SOLN
40.0000 mg | Freq: Once | INTRAMUSCULAR | Status: AC
  Administered 2024-04-16: 40 mg via INTRAVENOUS
  Filled 2024-04-16: qty 4

## 2024-04-16 MED ORDER — SENNOSIDES-DOCUSATE SODIUM 8.6-50 MG PO TABS: 1.0000 | ORAL_TABLET | Freq: Two times a day (BID) | ORAL | Status: DC | PRN

## 2024-04-16 NOTE — Progress Notes (Addendum)
 Progress Note  Patient Name: Darius Rivers Date of Encounter: 04/16/2024 Primary Cardiologist: Jann Melody, MD   Subjective   Overnight no events. Patient notes no CP, SOB, Palpitations. Feels almost back to baseline.  Vital Signs    Vitals:   04/16/24 0239 04/16/24 0311 04/16/24 0400 04/16/24 0736  BP: 96/60   104/86  Pulse: 74 77 69   Resp: 20   18  Temp: 97.8 F (36.6 C)   98.1 F (36.7 C)  TempSrc: Oral   Oral  SpO2: 96% 94% 94% 93%  Weight:  67.6 kg    Height:        Intake/Output Summary (Last 24 hours) at 04/16/2024 1026 Last data filed at 04/16/2024 0239 Gross per 24 hour  Intake 240 ml  Output 925 ml  Net -685 ml   Filed Weights   04/14/24 0957 04/15/24 0428 04/16/24 0311  Weight: 81.2 kg 69 kg 67.6 kg    Physical Exam   GEN: No acute distress.   Neck: No JVD Cardiac: RRR, no murmurs, rubs, or gallops.  Respiratory: Clear to auscultation bilaterally. GI: Soft, nontender, non-distended  MS: No edema  Labs   Telemetry: SR with PVCs and runs of NSVT   Chemistry Recent Labs  Lab 04/14/24 1018 04/14/24 1736 04/15/24 0343 04/16/24 0553  NA 143 140 137 136  K 3.8 3.8 3.7 3.4*  CL 106 109 105 101  CO2 19* 20* 20* 22  GLUCOSE 123* 125* 145* 115*  BUN 18 21 22  28*  CREATININE 1.29* 1.53* 1.47* 1.53*  CALCIUM  10.0 9.4 9.0 9.4  PROT 8.3* 6.9 6.4*  --   ALBUMIN 4.7 3.5 3.3*  --   AST 19 17 19   --   ALT 9 10 10   --   ALKPHOS 76 54 53  --   BILITOT 0.9 1.1 1.1  --   GFRNONAA >60 51* 54* 51*  ANIONGAP 18* 11 12 13      Hematology Recent Labs  Lab 04/14/24 1736 04/15/24 0343 04/16/24 0553  WBC 12.6* 9.5 9.1  RBC 3.21* 3.28* 4.09*  HGB 9.7* 9.8* 12.2*  HCT 29.4* 29.5* 36.6*  MCV 91.6 89.9 89.5  MCH 30.2 29.9 29.8  MCHC 33.0 33.2 33.3  RDW 17.1* 16.7* 17.2*  PLT 320 306 366    BNP Recent Labs  Lab 04/14/24 1018  PROBNP 5,937.0*     Cardiac Studies   Cardiac Studies & Procedures    ______________________________________________________________________________________________ CARDIAC CATHETERIZATION  CARDIAC CATHETERIZATION 02/06/2021  Conclusion  Mid LAD lesion is 15% stenosed.  Very low right heart pressures.  No significant coronary obstructive disease with very large LAD with minimal 15% narrowing after a large bifurcating first diagonal vessel and an LAD that wraps around the apex to supply the distal third of the inferior wall; all nondominant left circumflex vessel; large dominant normal RCA.  RECOMMENDATION: Findings are consistent with a nonischemic cardiomyopathy.  Right heart pressures are extremely low.  Will gently hydrate post procedure in this patient with EF of 25 to 30%.  Findings Coronary Findings Diagnostic  Dominance: Right  Left Anterior Descending Vessel is large. Mid LAD lesion is 15% stenosed.  First Diagonal Branch Vessel is large in size.  Left Circumflex Vessel is small.  Intervention  No interventions have been documented.   STRESS TESTS  NM MYOCAR MULTI W/SPECT W 11/13/2018  Narrative CLINICAL DATA:  Congestive heart failure. Hyperlipidemia, hypertension, smoking, and family history of coronary artery disease.  EXAM: MYOCARDIAL IMAGING WITH  SPECT (REST AND PHARMACOLOGIC-STRESS)  GATED LEFT VENTRICULAR WALL MOTION STUDY  LEFT VENTRICULAR EJECTION FRACTION  TECHNIQUE: Standard myocardial SPECT imaging was performed after resting intravenous injection of 10 mCi Tc-85m tetrofosmin . Subsequently, intravenous infusion of Lexiscan  was performed under the supervision of the Cardiology staff. At peak effect of the drug, 30 mCi Tc-39m tetrofosmin  was injected intravenously and standard myocardial SPECT imaging was performed. Quantitative gated imaging was also performed to evaluate left ventricular wall motion, and estimate left ventricular ejection fraction.  COMPARISON:  None.  FINDINGS: Perfusion: A large fixed  myocardial perfusion defect is seen involving the inferior wall of the left ventricle on both stress and resting images, which shows no evidence of reversibility. No reversible myocardial perfusion defects are seen.  Wall Motion: Inferior wall akinesis of left ventricle demonstrated. Moderate to severe left ventricular dilatation.  Left Ventricular Ejection Fraction: 34 %  End diastolic volume 242 ml  End systolic volume 161 ml  IMPRESSION: 1. No evidence of pharmacologic induced myocardial ischemia. Large inferior wall myocardial infarct.  2. Inferior wall akinesis of left ventricle, with moderate to severe dilatation.  3. Left ventricular ejection fraction 34%  4. Non invasive risk stratification*: High  *2012 Appropriate Use Criteria for Coronary Revascularization Focused Update: J Am Coll Cardiol. 2012;59(9):857-881. http://content.dementiazones.com.aspx?articleid=1201161   Electronically Signed By: Nicanor Barge M.D. On: 11/13/2018 13:10   ECHOCARDIOGRAM  ECHOCARDIOGRAM COMPLETE 04/15/2024  Narrative ECHOCARDIOGRAM REPORT    Patient Name:   Darius Rivers Date of Exam: 04/15/2024 Medical Rec #:  [id number]        Height:       76.5 in Accession #:    [id number]       Weight:       152.1 lb Date of Birth:  06/01/1961         BSA:          1.982 m Patient Age:    63 years         BP:           118/73 mmHg Patient Gender: M                HR:           71 bpm. Exam Location:  Inpatient  Procedure: 2D Echo, Cardiac Doppler, Color Doppler and Intracardiac Opacification Agent (Both Spectral and Color Flow Doppler were utilized during procedure).  Indications:    CHF  History:        Patient has prior history of Echocardiogram examinations, most recent 02/04/2021. CHF, CAD; Risk Factors:Hypertension.  Sonographer:    Astrid Blamer Referring Phys: [phone] EKTA V PATEL  IMPRESSIONS   1. Left ventricular ejection fraction, by estimation, is <20%. The left  ventricle has severely decreased function. The left ventricle demonstrates global hypokinesis. The left ventricular internal cavity size was severely dilated. There is moderate left ventricular hypertrophy. Left ventricular diastolic parameters are consistent with Grade I diastolic dysfunction (impaired relaxation). 2. Right ventricular systolic function is normal. The right ventricular size is normal. Tricuspid regurgitation signal is inadequate for assessing PA pressure. 3. Left atrial size was severely dilated. 4. The mitral valve is normal in structure. Mild mitral valve regurgitation. No evidence of mitral stenosis. 5. The aortic valve was not well visualized. Aortic valve regurgitation is moderate. Aortic valve sclerosis/calcification is present, without any evidence of aortic stenosis. 6. The inferior vena cava is normal in size with greater than 50% respiratory variability, suggesting right atrial pressure of 3 mmHg.  FINDINGS  Left Ventricle: Left ventricular ejection fraction, by estimation, is <20%. The left ventricle has severely decreased function. The left ventricle demonstrates global hypokinesis. The left ventricular internal cavity size was severely dilated. There is moderate left ventricular hypertrophy. Left ventricular diastolic parameters are consistent with Grade I diastolic dysfunction (impaired relaxation).  Right Ventricle: The right ventricular size is normal. No increase in right ventricular wall thickness. Right ventricular systolic function is normal. Tricuspid regurgitation signal is inadequate for assessing PA pressure.  Left Atrium: Left atrial size was severely dilated.  Right Atrium: Right atrial size was normal in size.  Pericardium: There is no evidence of pericardial effusion.  Mitral Valve: The mitral valve is normal in structure. Mild mitral valve regurgitation. No evidence of mitral valve stenosis.  Tricuspid Valve: The tricuspid valve is normal in  structure. Tricuspid valve regurgitation is trivial.  Aortic Valve: The aortic valve was not well visualized. Aortic valve regurgitation is moderate. Aortic valve sclerosis/calcification is present, without any evidence of aortic stenosis. Aortic valve mean gradient measures 4.0 mmHg. Aortic valve peak gradient measures 7.2 mmHg. Aortic valve area, by VTI measures 2.40 cm.  Pulmonic Valve: The pulmonic valve was not well visualized. Pulmonic valve regurgitation is not visualized.  Aorta: The aortic root is normal in size and structure.  Venous: The inferior vena cava is normal in size with greater than 50% respiratory variability, suggesting right atrial pressure of 3 mmHg.  IAS/Shunts: The atrial septum is grossly normal.   LEFT VENTRICLE PLAX 2D LVIDd:         7.20 cm   Diastology LVIDs:         6.60 cm   LV e' medial:    6.20 cm/s LV PW:         1.40 cm   LV E/e' medial:  7.1 LV IVS:        1.30 cm   LV e' lateral:   10.10 cm/s LVOT diam:     2.00 cm   LV E/e' lateral: 4.4 LV SV:         52 LV SV Index:   26 LVOT Area:     3.14 cm   RIGHT VENTRICLE RV S prime:     15.20 cm/s TAPSE (M-mode): 2.6 cm  LEFT ATRIUM              Index        RIGHT ATRIUM           Index LA Vol (A2C):   180.0 ml 90.83 ml/m  RA Area:     13.60 cm LA Vol (A4C):   60.5 ml  30.53 ml/m  RA Volume:   32.90 ml  16.60 ml/m LA Biplane Vol: 113.0 ml 57.02 ml/m AORTIC VALVE AV Area (Vmax):    2.17 cm AV Area (Vmean):   2.25 cm AV Area (VTI):     2.40 cm AV Vmax:           134.00 cm/s AV Vmean:          88.000 cm/s AV VTI:            0.216 m AV Peak Grad:      7.2 mmHg AV Mean Grad:      4.0 mmHg LVOT Vmax:         92.60 cm/s LVOT Vmean:        62.900 cm/s LVOT VTI:          0.165 m LVOT/AV VTI ratio: 0.76  AORTA  Ao Root diam: 3.70 cm  MITRAL VALVE MV Area (PHT): 4.33 cm    SHUNTS MV Decel Time: 175 msec    Systemic VTI:  0.16 m MV E velocity: 44.00 cm/s  Systemic Diam: 2.00 cm MV A  velocity: 92.20 cm/s MV E/A ratio:  0.48  Carson Clara MD Electronically signed by Carson Clara MD Signature Date/Time: 04/15/2024/3:00:09 PM    Final        CARDIAC MRI  MR CARDIAC MORPHOLOGY W WO CONTRAST 12/24/2023  Narrative CLINICAL DATA:  Clinical question of heart failure Study assumes HCT of 35 and BSA of 2.00.  EXAM: CARDIAC MRI  TECHNIQUE: The patient was scanned on a 1.5 Tesla GE magnet. A dedicated cardiac coil was used. Functional imaging was done using Fiesta sequences. 2,3, and 4 chamber views were done to assess for RWMA's. Modified Simpson's rule using a short axis stack was used to calculate an ejection fraction on a dedicated work [employer]. The patient received 7 cc of Gadavist . After 10 minutes inversion recovery sequences were used to assess for infiltration and scar tissue. Flow quantification was performed 2 times during this examination with flow quantification performed at the levels of the ascending aorta above the valve, pulmonary artery above the valve.  CONTRAST:  7 cc  of Gadavist   FINDINGS: 1. Severe increase in left ventricular size, with LVEDD 79 mm, and LVEDVi 220 mL/m2.  Normal left ventricular thickness.  Severe decrease in left ventricular systolic function (LVEF = 27%). Septum is notably hypokinetic.  Left ventricular parametric mapping notable for elevated ECV in the basal inferoseptal (57%).  There is late gadolinium enhancement in the left ventricular myocardium. LGE noted in the basal inferoseptal.  2.  Mild dilation right ventricular size with RVEDVI 103 mL/m2.  Normal right ventricular thickness.  Mild decrease in right ventricular systolic function (RVEF =42%). Septal hypokinesis.  3.  Normal right atrial size.  Severe left atrial dilation.  4.  Moderate ascending aortic dilation 46 mm.  Mild aortic root dilation 43 mm. This is marred by free breathing artifact.  5.  Valve assessment:  Aortic Valve: Tri-leaflet valve. Moderate aortic regurgitation, regurgitant fraction 30%.  Pulmonic Valve: Qualitatively, there is no significant regurgitation. Regurgitant fraction 4%.  Tricuspid Valve: Qualitatively, there mild significant regurgitation.  Mitral Valve: Qualitatively, there is mild significant regurgitation. Regurgitant fraction 13%.  6.  Normal pericardium.  No pericardial effusion.  7.  See Radiology report for non-cardiac reporting.  8. Breath hold artifacts noted. Suboptimal gating in short axis series.  IMPRESSION: 1.  Severe left ventricular dysfunction with dilated cardiomyopathy.  2. Mild right ventricular dysfunction related to septal dysfunction from the LV.  3. Moderate aortic aneurysm with mild aortic dilation on sinus to sinus evaluation.  4.  Moderate central aortic regurgitation.  Gloriann Larger MD   Electronically Signed By: Gloriann Larger M.D. On: 01/04/2024 12:56   ______________________________________________________________________________________________           Assessment & Plan   #Acute on chronic dilated, combined HFrEF #NICM # LBBB - NYHA III - A component of this is related to septal dyssnychrony; LVEF < 35% despite GDMT from February to June with stated compliance - Will give 40 IV lasix  X 1; BNP tomorrow  - has had some dietary non adherence - Follow strict I's and O's, daily weights and renal function while diuresing - Replete K+ to keep >4  and Mag > 2; he is having NSVT today ;standing K has  been given - I have a low threshold to get a RHC on Monday - GDMT:: Continue carvedilol  25 mg twice daily  Continue hydralazine  100 mg 3 times daily Continue with Entresto  97-103 mg twice daily Continue spironolactone  25 mg daily Dr. Micael Adas has resumed SGLT2i- agree   #Hypertensive urgency - complicated by dietary non adherence; on GDMT his BP is low; this may be related to  medication non adherence - Continue carvedilol  25 mg twice daily, hydralazine  100 mg 3 times daily, Entresto  97-103 mg twice daily, spironolactone  25 mg daily - asymptomatic of low BP- low threshold to start IMDUR    CAD Type II elevation in troponin < 100  HLD - Continue on ASA 81 mg daily, Zetia  10 mg daily, Crestor  40 mg daily   Returned to discuss care with Daughter   For questions or updates, please contact [facility] Please consult [email or web address] for contact info under Cardiology/STEMI.      Gloriann Larger, MD FASE [facility] Cardiologist [facility]  [address] [place], [address] [place], [place] [address] [phone]  10:26 AM

## 2024-04-16 NOTE — Progress Notes (Signed)
 PROGRESS NOTE  Darius Rivers ZOX:[id number] DOB: September 08, 1961   PCP: Alejo Amsler, PA  Patient is from: Home.  Lives with family.  DOA: 04/14/2024 LOS: 2  Chief complaints Chief Complaint  Patient presents with   Shortness of Breath     Brief Narrative / Interim history: 63 y.o. male with past medical history  of  HFrEF, aneurysm of ascending aorta without rupture, aortic insufficiency, coronary artery disease/NSTEMI in 2020 January,, hypertension, hyperlipidemia, prostate cancer, CKD, presents with concern for shortness of breath ongoing for the past day.  The emergency room patient was found to have proBNP 5937 as well as chest x-ray showing pulmonary vascular congestion concerning for acute on chronic heart failure exacerbation.  Patient currently undergoing diuresis with cardiologist input   Subjective: Seen and examined earlier this morning.  No major events overnight or this morning.  No complaints.  Patient's daughter and sister at bedside.  Has not had bowel movement in 4 days now.  Objective: Vitals:   04/16/24 1000 04/16/24 1100 04/16/24 1114 04/16/24 1235  BP:   106/81   Pulse: 70 80 71   Resp:   17   Temp:   97.8 F (36.6 C)   TempSrc:   Oral   SpO2: 96% 96% 93%   Weight:    67.2 kg  Height:        Examination:  GENERAL: No apparent distress.  Nontoxic. HEENT: MMM.  Vision and hearing grossly intact.  NECK: Supple.  No apparent JVD.  RESP:  No IWOB.  Fair aeration bilaterally. CVS:  RRR. Heart sounds normal.  ABD/GI/GU: BS+. Abd soft, NTND.  MSK/EXT:  Moves extremities. No apparent deformity. No edema.  SKIN: no apparent skin lesion or wound NEURO: AA.  Oriented appropriately.  No apparent focal neuro deficit. PSYCH: Calm. Normal affect.   Consultants:  Cardiology  Procedures: None  Microbiology summarized: MRSA PCR screen nonreactive A-20 pathogen RVP nonreactive Blood culture NGTD  Assessment and plan: Acute on chronic HFrEF: TTE with  LVEF of <20%, GH, G1DD, severe LAE and moderate AR.  proBNP was elevated to 6000.  CXR consistent with HF.  Cardiology diuresing.  Net -2.5 L so far.  Creatinine relatively stable.  Appears euvolemic on exam. -Appreciate care by cardiology-IV Lasix  40 mg x 1 today, and BNP tomorrow -GDMT-Coreg , Entresto , Aldactone , hydralazine  and Jardiance  -Strict intake and output, daily weight, renal functions and electrolytes -Per cardiology, low threshold to get RHC on Monday  Acute hypoxic respiratory failure due to CHF exacerbation.  Resolved. - Manage CHF as above.  Hypertensive urgency: Improved.  Normotensive this morning. - Diuretics and antihypertensive meds as above  Elevated troponin/demand ischemia: Likely due to the above.  TTE as above. History of CAD/STEMI in 2020 - Cardiac meds as above - Continue Crestor , Zetia , low-dose aspirin   Moderate aortic regurgitation - Per cardiology.  Hyperlipidemia -continue statin and Zetia  as above   Ascending aortic aneurysm without rupture -continue outpatient follow-up   CKD stage IIIa: Cr at baseline. -Continue monitoring  Constipation - Bowel regimen  Hypokalemia - Replenish and recheck as appropriate  Severe malnutrition Body mass index is 17.8 kg/m. Nutrition Problem: Severe Malnutrition Etiology: chronic illness (CKD, HFrEF) Signs/Symptoms: severe muscle depletion, severe fat depletion Interventions: Ensure Enlive (each supplement provides 350kcal and 20 grams of protein), MVI, Magic cup, Education   DVT prophylaxis:  heparin  injection 5,000 Units Start: 04/14/24 1700  Code Status: Full code Family Communication: Updated patient's daughter and sister at bedside Level of care:  Progressive Status is: Inpatient Remains inpatient appropriate because: Acute CHF   Final disposition: Likely home once medically stable   55 minutes with more than 50% spent in reviewing records, counseling patient/family and coordinating  care.   Sch Meds:  Scheduled Meds:  aspirin  EC  81 mg Oral Daily   carvedilol   25 mg Oral BID   empagliflozin   10 mg Oral Daily   ezetimibe   10 mg Oral Daily   feeding supplement  237 mL Oral BID BM   heparin   5,000 Units Subcutaneous Q8H   hydrALAZINE   100 mg Oral TID   multivitamin with minerals  1 tablet Oral Daily   polyethylene glycol  17 g Oral Once   potassium chloride   40 mEq Oral BID   rosuvastatin   40 mg Oral q1800   sacubitril -valsartan   1 tablet Oral BID   senna-docusate  2 tablet Oral Once   sodium chloride  flush  3 mL Intravenous Q12H   spironolactone   25 mg Oral Daily   Continuous Infusions: PRN Meds:.acetaminophen  **OR** acetaminophen , hydrALAZINE , polyethylene glycol **FOLLOWED BY** polyethylene glycol, senna-docusate **FOLLOWED BY** senna-docusate  Antimicrobials: Anti-infectives (From admission, onward)    None        I have personally reviewed the following labs and images: CBC: Recent Labs  Lab 04/14/24 1018 04/14/24 1736 04/15/24 0343 04/16/24 0553  WBC 10.9* 12.6* 9.5 9.1  HGB 10.5* 9.7* 9.8* 12.2*  HCT 31.6* 29.4* 29.5* 36.6*  MCV 91.3 91.6 89.9 89.5  PLT 319 320 306 366   BMP &GFR Recent Labs  Lab 04/14/24 1018 04/14/24 1224 04/14/24 1736 04/15/24 0343 04/16/24 0553  NA 143  --  140 137 136  K 3.8  --  3.8 3.7 3.4*  CL 106  --  109 105 101  CO2 19*  --  20* 20* 22  GLUCOSE 123*  --  125* 145* 115*  BUN 18  --  21 22 28*  CREATININE 1.29*  --  1.53* 1.47* 1.53*  CALCIUM  10.0  --  9.4 9.0 9.4  MG  --  1.9 1.8 2.0 1.9   Estimated Creatinine Clearance: 48.2 mL/min (A) (by C-G formula based on SCr of 1.53 mg/dL (H)). Liver & Pancreas: Recent Labs  Lab 04/14/24 1018 04/14/24 1736 04/15/24 0343  AST 19 17 19   ALT 9 10 10   ALKPHOS 76 54 53  BILITOT 0.9 1.1 1.1  PROT 8.3* 6.9 6.4*  ALBUMIN 4.7 3.5 3.3*   No results for input(s): LIPASE, AMYLASE in the last 168 hours. No results for input(s): AMMONIA in the last 168  hours. Diabetic: Recent Labs    04/14/24 1736  HGBA1C 5.5   Recent Labs  Lab 04/14/24 2119 04/15/24 0622 04/15/24 1141 04/15/24 1624 04/15/24 2117  GLUCAP 131* 152* 129* 132* 128*   Cardiac Enzymes: No results for input(s): CKTOTAL, CKMB, CKMBINDEX, TROPONINI in the last 168 hours. Recent Labs    04/14/24 1018  PROBNP 5,937.0*   Coagulation Profile: No results for input(s): INR, PROTIME in the last 168 hours. Thyroid  Function Tests: No results for input(s): TSH, T4TOTAL, FREET4, T3FREE, THYROIDAB in the last 72 hours. Lipid Profile: Recent Labs    04/15/24 0343  CHOL 186  HDL 34*  LDLCALC 141*  TRIG 54  CHOLHDL 5.5   Anemia Panel: No results for input(s): VITAMINB12, FOLATE, FERRITIN, TIBC, IRON, RETICCTPCT in the last 72 hours. Urine analysis:    Component Value Date/Time   COLORURINE YELLOW 02/04/2021 0010   APPEARANCEUR CLEAR 02/04/2021 0010  LABSPEC 1.025 02/04/2021 0010   PHURINE 6.0 02/04/2021 0010   GLUCOSEU NEGATIVE 02/04/2021 0010   HGBUR MODERATE (A) 02/04/2021 0010   BILIRUBINUR NEGATIVE 02/04/2021 0010   KETONESUR NEGATIVE 02/04/2021 0010   PROTEINUR NEGATIVE 02/04/2021 0010   NITRITE NEGATIVE 02/04/2021 0010   LEUKOCYTESUR NEGATIVE 02/04/2021 0010   Sepsis Labs: Invalid input(s): PROCALCITONIN, LACTICIDVEN  Microbiology: Recent Results (from the past 240 hours)  MRSA Next Gen by PCR, Nasal     Status: None   Collection Time: 04/14/24  3:13 PM   Specimen: Nasal Mucosa; Nasal Swab  Result Value Ref Range Status   MRSA by PCR Next Gen NOT DETECTED NOT DETECTED Final    Comment: (NOTE) The GeneXpert MRSA Assay (FDA approved for NASAL specimens only), is one component of a comprehensive MRSA colonization surveillance program. It is not intended to diagnose MRSA infection nor to guide or monitor treatment for MRSA infections. Test performance is not FDA approved in patients less than 59  years old. Performed at [facility], [address], [place], [place] [address]   Culture, blood (Routine X 2) w Reflex to ID Panel     Status: None (Preliminary result)   Collection Time: 04/14/24  8:33 PM   Specimen: BLOOD LEFT HAND  Result Value Ref Range Status   Specimen Description BLOOD LEFT HAND  Final   Special Requests   Final    BOTTLES DRAWN AEROBIC AND ANAEROBIC Blood Culture adequate volume   Culture   Final    NO GROWTH 2 DAYS Performed at [facility], [address], [place], [place] [address]    Report Status PENDING  Incomplete  Culture, blood (Routine X 2) w Reflex to ID Panel     Status: None (Preliminary result)   Collection Time: 04/14/24  8:35 PM   Specimen: BLOOD RIGHT HAND  Result Value Ref Range Status   Specimen Description BLOOD RIGHT HAND  Final   Special Requests   Final    BOTTLES DRAWN AEROBIC AND ANAEROBIC Blood Culture adequate volume   Culture   Final    NO GROWTH 2 DAYS Performed at [facility], [address] [place], [place] [address]    Report Status PENDING  Incomplete  Respiratory (~20 pathogens) panel by PCR     Status: None   Collection Time: 04/15/24 10:00 AM   Specimen: Nasopharyngeal Swab; Respiratory  Result Value Ref Range Status   Adenovirus NOT DETECTED NOT DETECTED Final   Coronavirus 229E NOT DETECTED NOT DETECTED Final    Comment: (NOTE) The Coronavirus on the Respiratory Panel, DOES NOT test for the novel  Coronavirus (2019 nCoV)    Coronavirus HKU1 NOT DETECTED NOT DETECTED Final   Coronavirus NL63 NOT DETECTED NOT DETECTED Final   Coronavirus OC43 NOT DETECTED NOT DETECTED Final   Metapneumovirus NOT DETECTED NOT DETECTED Final   Rhinovirus / Enterovirus NOT DETECTED NOT DETECTED Final   Influenza A NOT DETECTED NOT DETECTED Final   Influenza B NOT DETECTED NOT DETECTED Final   Parainfluenza Virus 1 NOT DETECTED NOT DETECTED Final   Parainfluenza Virus 2 NOT DETECTED NOT DETECTED Final    Parainfluenza Virus 3 NOT DETECTED NOT DETECTED Final   Parainfluenza Virus 4 NOT DETECTED NOT DETECTED Final   Respiratory Syncytial Virus NOT DETECTED NOT DETECTED Final   Bordetella pertussis NOT DETECTED NOT DETECTED Final   Bordetella Parapertussis NOT DETECTED NOT DETECTED Final   Chlamydophila pneumoniae NOT DETECTED NOT DETECTED Final  Mycoplasma pneumoniae NOT DETECTED NOT DETECTED Final    Comment: Performed at [facility], [address], [place], [place] [address]    Radiology Studies: No results found.    Bonny Egger T. Ellia Knowlton [facility]  If 7PM-7AM, please contact night-coverage [email or web address] 04/16/2024, 3:02 PM

## 2024-04-16 NOTE — Progress Notes (Deleted)
 At 0215 patient began vomiting, small to medium amount. He did not feel it come on, can't pinpoint any precipitating factors. Paged MD Opyd for antiemetic.

## 2024-04-16 NOTE — Plan of Care (Signed)
  Problem: Education: Goal: Knowledge of General Education information will improve Description: Including pain rating scale, medication(s)/side effects and non-pharmacologic comfort measures Outcome: Progressing   Problem: Activity: Goal: Risk for activity intolerance will decrease Outcome: Progressing   Problem: Cardiac: Goal: Ability to achieve and maintain adequate cardiopulmonary perfusion will improve Outcome: Progressing

## 2024-04-17 DIAGNOSIS — R0603 Acute respiratory distress: Secondary | ICD-10-CM | POA: Diagnosis not present

## 2024-04-17 LAB — CBC
HCT: 40.9 % (ref 39.0–52.0)
Hemoglobin: 13.4 g/dL (ref 13.0–17.0)
MCH: 29.9 pg (ref 26.0–34.0)
MCHC: 32.8 g/dL (ref 30.0–36.0)
MCV: 91.3 fL (ref 80.0–100.0)
Platelets: 387 10*3/uL (ref 150–400)
RBC: 4.48 MIL/uL (ref 4.22–5.81)
RDW: 17.5 % — ABNORMAL HIGH (ref 11.5–15.5)
WBC: 8.6 10*3/uL (ref 4.0–10.5)
nRBC: 0 % (ref 0.0–0.2)

## 2024-04-17 LAB — RENAL FUNCTION PANEL
Albumin: 3.5 g/dL (ref 3.5–5.0)
Anion gap: 13 (ref 5–15)
BUN: 37 mg/dL — ABNORMAL HIGH (ref 8–23)
CO2: 21 mmol/L — ABNORMAL LOW (ref 22–32)
Calcium: 9.1 mg/dL (ref 8.9–10.3)
Chloride: 100 mmol/L (ref 98–111)
Creatinine, Ser: 1.86 mg/dL — ABNORMAL HIGH (ref 0.61–1.24)
GFR, Estimated: 41 mL/min — ABNORMAL LOW (ref >60)
Glucose, Bld: 125 mg/dL — ABNORMAL HIGH (ref 70–99)
Phosphorus: 4.2 mg/dL (ref 2.5–4.6)
Potassium: 4.4 mmol/L (ref 3.5–5.1)
Sodium: 134 mmol/L — ABNORMAL LOW (ref 135–145)

## 2024-04-17 LAB — BRAIN NATRIURETIC PEPTIDE: B Natriuretic Peptide: 616.6 pg/mL — ABNORMAL HIGH (ref 0.0–100.0)

## 2024-04-17 LAB — OCCULT BLOOD X 1 CARD TO LAB, STOOL: Fecal Occult Bld: NEGATIVE

## 2024-04-17 LAB — MAGNESIUM: Magnesium: 2.2 mg/dL (ref 1.7–2.4)

## 2024-04-17 MED ORDER — SODIUM CHLORIDE 0.9 % IV SOLN: INTRAVENOUS | Status: DC

## 2024-04-17 NOTE — Plan of Care (Signed)
 Problem: Education: Goal: Knowledge of General Education information will improve Description: Including pain rating scale, medication(s)/side effects and non-pharmacologic comfort measures Outcome: Progressing   Problem: Health Behavior/Discharge Planning: Goal: Ability to manage health-related needs will improve Outcome: Progressing   Problem: Clinical Measurements: Goal: Ability to maintain clinical measurements within normal limits will improve Outcome: Progressing Goal: Will remain free from infection Outcome: Progressing Goal: Diagnostic test results will improve Outcome: Progressing Goal: Respiratory complications will improve Outcome: Progressing Goal: Cardiovascular complication will be avoided Outcome: Progressing   Problem: Activity: Goal: Risk for activity intolerance will decrease Outcome: Progressing   Problem: Nutrition: Goal: Adequate nutrition will be maintained Outcome: Progressing   Problem: Coping: Goal: Level of anxiety will decrease Outcome: Progressing   Problem: Elimination: Goal: Will not experience complications related to bowel motility Outcome: Progressing Goal: Will not experience complications related to urinary retention Outcome: Progressing   Problem: Pain Managment: Goal: General experience of comfort will improve and/or be controlled Outcome: Progressing   Problem: Safety: Goal: Ability to remain free from injury will improve Outcome: Progressing   Problem: Skin Integrity: Goal: Risk for impaired skin integrity will decrease Outcome: Progressing   Problem: Education: Goal: Ability to demonstrate management of disease process will improve Outcome: Progressing Goal: Ability to verbalize understanding of medication therapies will improve Outcome: Progressing Goal: Individualized Educational Video(s) Outcome: Progressing   Problem: Activity: Goal: Capacity to carry out activities will improve Outcome: Progressing    Problem: Cardiac: Goal: Ability to achieve and maintain adequate cardiopulmonary perfusion will improve Outcome: Progressing   Problem: Education: Goal: Ability to describe self-care measures that may prevent or decrease complications (Diabetes Survival Skills Education) will improve Outcome: Progressing Goal: Individualized Educational Video(s) Outcome: Progressing   Problem: Coping: Goal: Ability to adjust to condition or change in health will improve Outcome: Progressing   Problem: Fluid Volume: Goal: Ability to maintain a balanced intake and output will improve Outcome: Progressing   Problem: Health Behavior/Discharge Planning: Goal: Ability to identify and utilize available resources and services will improve Outcome: Progressing Goal: Ability to manage health-related needs will improve Outcome: Progressing   Problem: Metabolic: Goal: Ability to maintain appropriate glucose levels will improve Outcome: Progressing   Problem: Nutritional: Goal: Maintenance of adequate nutrition will improve Outcome: Progressing Goal: Progress toward achieving an optimal weight will improve Outcome: Progressing   Problem: Skin Integrity: Goal: Risk for impaired skin integrity will decrease Outcome: Progressing   Problem: Tissue Perfusion: Goal: Adequacy of tissue perfusion will improve Outcome: Progressing   Problem: Education: Goal: Ability to demonstrate management of disease process will improve Outcome: Progressing Goal: Ability to verbalize understanding of medication therapies will improve Outcome: Progressing Goal: Individualized Educational Video(s) Outcome: Progressing   Problem: Activity: Goal: Capacity to carry out activities will improve Outcome: Progressing   Problem: Cardiac: Goal: Ability to achieve and maintain adequate cardiopulmonary perfusion will improve Outcome: Progressing   Problem: Education: Goal: Ability to describe self-care measures that may  prevent or decrease complications (Diabetes Survival Skills Education) will improve Outcome: Progressing Goal: Individualized Educational Video(s) Outcome: Progressing   Problem: Coping: Goal: Ability to adjust to condition or change in health will improve Outcome: Progressing   Problem: Fluid Volume: Goal: Ability to maintain a balanced intake and output will improve Outcome: Progressing   Problem: Health Behavior/Discharge Planning: Goal: Ability to identify and utilize available resources and services will improve Outcome: Progressing Goal: Ability to manage health-related needs will improve Outcome: Progressing   Problem: Metabolic: Goal: Ability to maintain appropriate glucose  levels will improve Outcome: Progressing   Problem: Nutritional: Goal: Maintenance of adequate nutrition will improve Outcome: Progressing Goal: Progress toward achieving an optimal weight will improve Outcome: Progressing

## 2024-04-17 NOTE — Progress Notes (Signed)
 PROGRESS NOTE  Darius Rivers JXB:[id number] DOB: 1961/02/10   PCP: Alejo Amsler, PA  Patient is from: Home.  Lives with family.  DOA: 04/14/2024 LOS: 3  Chief complaints Chief Complaint  Patient presents with   Shortness of Breath     Brief Narrative / Interim history: 63 y.o. male with past medical history  of  HFrEF, aneurysm of ascending aorta without rupture, aortic insufficiency, coronary artery disease/NSTEMI in 2020 January,, hypertension, hyperlipidemia, prostate cancer, CKD, presents with concern for shortness of breath ongoing for the past day.  The emergency room patient was found to have proBNP 5937 as well as chest x-ray showing pulmonary vascular congestion concerning for acute on chronic heart failure exacerbation.  Diuresed with IV Lasix  but AKI and hypotension.  Diuretics held.  He GDMT decreased.  Cardiology consulted advanced heart failure team.  Subjective: Seen and examined earlier this morning.  No major events overnight or this morning.  No complaints.  Objective: Vitals:   04/17/24 1042 04/17/24 1059 04/17/24 1109 04/17/24 1118  BP:  (!) 79/60 (!) 79/60 (!) 85/68  Pulse: 66  62   Resp: 17  17   Temp: 97.6 F (36.4 C)  97.6 F (36.4 C)   TempSrc: Oral  Oral   SpO2: 94%  94%   Weight:      Height:        Examination:  GENERAL: No apparent distress.  Nontoxic. HEENT: MMM.  Vision and hearing grossly intact.  NECK: Supple.  No apparent JVD.  RESP:  No IWOB.  Fair aeration bilaterally. CVS:  RRR. Heart sounds normal.  ABD/GI/GU: BS+. Abd soft, NTND.  MSK/EXT:  Moves extremities. No apparent deformity. No edema.  SKIN: no apparent skin lesion or wound NEURO: AA.  Oriented appropriately.  No apparent focal neuro deficit. PSYCH: Calm. Normal affect.   Consultants:  Cardiology Advanced heart failure team  Procedures: None  Microbiology summarized: MRSA PCR screen nonreactive A-20 pathogen RVP nonreactive Blood culture  NGTD  Assessment and plan: Acute on chronic HFrEF: TTE with LVEF of <20%, GH, G1DD, severe LAE and moderate AR.  proBNP was elevated to 6000.  CXR consistent with HF.  Cardiology diuresing.  Net -2.6 L so far.  Had some AKI and hypotension. -Appreciate care by cardiology-discontinued Lasix , Entresto , hydralazine  and Coreg  -Remains on Jardiance  and Aldactone . -Strict intake and output, daily weight, renal functions and electrolytes  Acute hypoxic respiratory failure due to CHF exacerbation.  Resolved. - Manage CHF as above.  Hypertensive urgency: Improved.  Normotensive this morning. - Diuretics and antihypertensive meds as above  AKI on CKD-3A: Baseline Cr ~1.5.  Likely due to diuretics, Entresto  and hypotension. Recent Labs    12/04/23 1002 12/14/23 0945 12/14/23 2036 12/15/23 0259 12/23/23 0933 04/14/24 1018 04/14/24 1736 04/15/24 0343 04/16/24 0553 04/17/24 0231  BUN 14 21*  --  27* 23* 18 21 22  28* 37*  CREATININE 1.31* 1.26* 1.57* 1.39* 1.61* 1.29* 1.53* 1.47* 1.53* 1.86*  - Cardiac meds on diuretics at adjusted as above - Continue monitoring  Elevated troponin/demand ischemia: Likely due to the above.  TTE as above. History of CAD/STEMI in 2020 - Cardiac meds as above - Continue Crestor , Zetia , low-dose aspirin   Moderate aortic regurgitation - Per cardiology.  Hyperlipidemia -continue statin and Zetia  as above   Ascending aortic aneurysm without rupture -continue outpatient follow-up  Constipation: Resolved. - Bowel regimen  Hypokalemia - Replenish and recheck as appropriate  Severe malnutrition Body mass index is 15.41 kg/m. Nutrition Problem:  Severe Malnutrition Etiology: chronic illness (CKD, HFrEF) Signs/Symptoms: severe muscle depletion, severe fat depletion Interventions: Ensure Enlive (each supplement provides 350kcal and 20 grams of protein), MVI, Magic cup, Education   DVT prophylaxis:  heparin  injection 5,000 Units Start: 04/14/24  1700  Code Status: Full code Family Communication: None at bedside. Level of care: Progressive Status is: Inpatient Remains inpatient appropriate because: Acute CHF and AKI   Final disposition: Likely home once medically stable   55 minutes with more than 50% spent in reviewing records, counseling patient/family and coordinating care.   Sch Meds:  Scheduled Meds:  aspirin  EC  81 mg Oral Daily   empagliflozin   10 mg Oral Daily   ezetimibe   10 mg Oral Daily   feeding supplement  237 mL Oral BID BM   heparin   5,000 Units Subcutaneous Q8H   multivitamin with minerals  1 tablet Oral Daily   potassium chloride   40 mEq Oral BID   rosuvastatin   40 mg Oral q1800   sodium chloride  flush  3 mL Intravenous Q12H   spironolactone   25 mg Oral Daily   Continuous Infusions: PRN Meds:.acetaminophen  **OR** acetaminophen , hydrALAZINE , [COMPLETED] polyethylene glycol **FOLLOWED BY** polyethylene glycol, [COMPLETED] senna-docusate **FOLLOWED BY** senna-docusate  Antimicrobials: Anti-infectives (From admission, onward)    None        I have personally reviewed the following labs and images: CBC: Recent Labs  Lab 04/14/24 1018 04/14/24 1736 04/15/24 0343 04/16/24 0553 04/17/24 0231  WBC 10.9* 12.6* 9.5 9.1 8.6  HGB 10.5* 9.7* 9.8* 12.2* 13.4  HCT 31.6* 29.4* 29.5* 36.6* 40.9  MCV 91.3 91.6 89.9 89.5 91.3  PLT 319 320 306 366 387   BMP &GFR Recent Labs  Lab 04/14/24 1018 04/14/24 1224 04/14/24 1736 04/15/24 0343 04/16/24 0553 04/17/24 0231  NA 143  --  140 137 136 134*  K 3.8  --  3.8 3.7 3.4* 4.4  CL 106  --  109 105 101 100  CO2 19*  --  20* 20* 22 21*  GLUCOSE 123*  --  125* 145* 115* 125*  BUN 18  --  21 22 28* 37*  CREATININE 1.29*  --  1.53* 1.47* 1.53* 1.86*  CALCIUM  10.0  --  9.4 9.0 9.4 9.1  MG  --  1.9 1.8 2.0 1.9 2.2  PHOS  --   --   --   --   --  4.2   Estimated Creatinine Clearance: 34.3 mL/min (A) (by C-G formula based on SCr of 1.86 mg/dL (H)). Liver  & Pancreas: Recent Labs  Lab 04/14/24 1018 04/14/24 1736 04/15/24 0343 04/17/24 0231  AST 19 17 19   --   ALT 9 10 10   --   ALKPHOS 76 54 53  --   BILITOT 0.9 1.1 1.1  --   PROT 8.3* 6.9 6.4*  --   ALBUMIN 4.7 3.5 3.3* 3.5   No results for input(s): LIPASE, AMYLASE in the last 168 hours. No results for input(s): AMMONIA in the last 168 hours. Diabetic: Recent Labs    04/14/24 1736  HGBA1C 5.5   Recent Labs  Lab 04/15/24 0622 04/15/24 1141 04/15/24 1624 04/15/24 2117 04/16/24 2213  GLUCAP 152* 129* 132* 128* 117*   Cardiac Enzymes: No results for input(s): CKTOTAL, CKMB, CKMBINDEX, TROPONINI in the last 168 hours. Recent Labs    04/14/24 1018  PROBNP 5,937.0*   Coagulation Profile: No results for input(s): INR, PROTIME in the last 168 hours. Thyroid  Function Tests: No results for input(s): TSH,  T4TOTAL, FREET4, T3FREE, THYROIDAB in the last 72 hours. Lipid Profile: Recent Labs    04/15/24 0343  CHOL 186  HDL 34*  LDLCALC 141*  TRIG 54  CHOLHDL 5.5   Anemia Panel: No results for input(s): VITAMINB12, FOLATE, FERRITIN, TIBC, IRON, RETICCTPCT in the last 72 hours. Urine analysis:    Component Value Date/Time   COLORURINE YELLOW 02/04/2021 0010   APPEARANCEUR CLEAR 02/04/2021 0010   LABSPEC 1.025 02/04/2021 0010   PHURINE 6.0 02/04/2021 0010   GLUCOSEU NEGATIVE 02/04/2021 0010   HGBUR MODERATE (A) 02/04/2021 0010   BILIRUBINUR NEGATIVE 02/04/2021 0010   KETONESUR NEGATIVE 02/04/2021 0010   PROTEINUR NEGATIVE 02/04/2021 0010   NITRITE NEGATIVE 02/04/2021 0010   LEUKOCYTESUR NEGATIVE 02/04/2021 0010   Sepsis Labs: Invalid input(s): PROCALCITONIN, LACTICIDVEN  Microbiology: Recent Results (from the past 240 hours)  MRSA Next Gen by PCR, Nasal     Status: None   Collection Time: 04/14/24  3:13 PM   Specimen: Nasal Mucosa; Nasal Swab  Result Value Ref Range Status   MRSA by PCR Next Gen NOT DETECTED NOT  DETECTED Final    Comment: (NOTE) The GeneXpert MRSA Assay (FDA approved for NASAL specimens only), is one component of a comprehensive MRSA colonization surveillance program. It is not intended to diagnose MRSA infection nor to guide or monitor treatment for MRSA infections. Test performance is not FDA approved in patients less than 91 years old. Performed at [facility], [address], [place], [place] [address]   Culture, blood (Routine X 2) w Reflex to ID Panel     Status: None (Preliminary result)   Collection Time: 04/14/24  8:33 PM   Specimen: BLOOD LEFT HAND  Result Value Ref Range Status   Specimen Description BLOOD LEFT HAND  Final   Special Requests   Final    BOTTLES DRAWN AEROBIC AND ANAEROBIC Blood Culture adequate volume   Culture   Final    NO GROWTH 3 DAYS Performed at [facility], [address] [place], [place] [address]    Report Status PENDING  Incomplete  Culture, blood (Routine X 2) w Reflex to ID Panel     Status: None (Preliminary result)   Collection Time: 04/14/24  8:35 PM   Specimen: BLOOD RIGHT HAND  Result Value Ref Range Status   Specimen Description BLOOD RIGHT HAND  Final   Special Requests   Final    BOTTLES DRAWN AEROBIC AND ANAEROBIC Blood Culture adequate volume   Culture   Final    NO GROWTH 3 DAYS Performed at [facility], [address] [place], [place] [address]    Report Status PENDING  Incomplete  Respiratory (~20 pathogens) panel by PCR     Status: None   Collection Time: 04/15/24 10:00 AM   Specimen: Nasopharyngeal Swab; Respiratory  Result Value Ref Range Status   Adenovirus NOT DETECTED NOT DETECTED Final   Coronavirus 229E NOT DETECTED NOT DETECTED Final    Comment: (NOTE) The Coronavirus on the Respiratory Panel, DOES NOT test for the novel  Coronavirus (2019 nCoV)    Coronavirus HKU1 NOT DETECTED NOT DETECTED Final   Coronavirus NL63 NOT DETECTED NOT DETECTED Final   Coronavirus OC43 NOT DETECTED  NOT DETECTED Final   Metapneumovirus NOT DETECTED NOT DETECTED Final   Rhinovirus / Enterovirus NOT DETECTED NOT DETECTED Final   Influenza A NOT DETECTED NOT DETECTED Final   Influenza B NOT DETECTED NOT DETECTED Final   Parainfluenza  Virus 1 NOT DETECTED NOT DETECTED Final   Parainfluenza Virus 2 NOT DETECTED NOT DETECTED Final   Parainfluenza Virus 3 NOT DETECTED NOT DETECTED Final   Parainfluenza Virus 4 NOT DETECTED NOT DETECTED Final   Respiratory Syncytial Virus NOT DETECTED NOT DETECTED Final   Bordetella pertussis NOT DETECTED NOT DETECTED Final   Bordetella Parapertussis NOT DETECTED NOT DETECTED Final   Chlamydophila pneumoniae NOT DETECTED NOT DETECTED Final   Mycoplasma pneumoniae NOT DETECTED NOT DETECTED Final    Comment: Performed at [facility], [address], [place], [place] [address]    Radiology Studies: No results found.    Rethel Sebek T. Pearley Baranek [facility]  If 7PM-7AM, please contact night-coverage [email or web address] 04/17/2024, 2:59 PM

## 2024-04-17 NOTE — Progress Notes (Signed)
 [facility] night cross cover note:   I was notified by the patient's RN of most SBP's in the 80's with corresponding MAP's in the mid 60's to low 70's mmHg. this is relative to systolic blood pressures in the 90s earlier this evening.  Additional vital signs appear stable, including afebrile, heart rates in the 60s, with oxygen saturation 98 to 100% on room air.  The patient is without any acute complaints at this time. He is here with acute on chronic systolic heart failure, undergoing IV diuresis, and appears to be greater than -1 L fluid balance over this evening's night shift.  Additionally, he received multiple antihypertensive medications last evening, including Coreg , Entresto .   RN conveys that the patient had a dark appearing bowel movement this morning, and has been sent for fecal occult blood evaluation, with result currently pending.  He is on a daily baby aspirin .   CBC this morning reflects hemoglobin of 13.4, relative to 12.2 yesterday morning.  I am hesitant at this time to provide IV fluids for this patient, who has been undergoing IV diuresis.  In the setting of maps of 65 mmHg or greater in this patient who is awake, alert, currently asymptomatic, and with good urine output overnight, will continue to monitor ensuing blood pressure trend.  I also added hold parameters for hypotension to his antihypertensive medications.    Camelia Cavalier, DO Hospitalist

## 2024-04-17 NOTE — Progress Notes (Signed)
 Mobility Specialist Progress Note;   04/17/24 1059  Therapy Vitals  BP (!) 79/60  Mobility  Activity Ambulated with assistance to bathroom;Stood at bedside  Level of Assistance Standby assist, set-up cues, supervision of patient - no hands on  Assistive Device None  Distance Ambulated (ft) 10 ft  Activity Response Tolerated fair  Mobility Referral Yes  Mobility visit 1 Mobility  Mobility Specialist Start Time (ACUTE ONLY) 1059  Mobility Specialist Stop Time (ACUTE ONLY) 1111  Mobility Specialist Time Calculation (min) (ACUTE ONLY) 12 min   Pt requesting assistance to BR. Required no physical assistance during ambulation, SV. Pt returned back to bed d/t feeling dizzy. BP taken (see below). Deferred further mobility. Pt returned back to supine w/ dizziness recovered. Pt left in bed with all needs met. RN notified.   Post-mobility: BP 79/60 (68)  Janit Meline Mobility Specialist Please contact via Special educational needs teacher or [employer] [phone]

## 2024-04-17 NOTE — Progress Notes (Signed)
 Progress Note  Patient Name: Darius Rivers Date of Encounter: 04/17/2024 Primary Cardiologist: Jann Melody, MD   Subjective   Overnight orthostatic hypotension noted.  Low BP but with narrow pulse pressure- he is only symptomatic when standing  Vital Signs    Vitals:   04/17/24 0324 04/17/24 0407 04/17/24 0600 04/17/24 0727  BP: (!) 83/66 101/75 97/75 92/72   Pulse: 64 68 72   Resp:    17  Temp:    98.4 F (36.9 C)  TempSrc:    Oral  SpO2: 100% 98% 98%   Weight:      Height:        Intake/Output Summary (Last 24 hours) at 04/17/2024 1018 Last data filed at 04/17/2024 0833 Gross per 24 hour  Intake 1425.98 ml  Output 1570 ml  Net -144.02 ml   Filed Weights   04/16/24 0311 04/16/24 1235 04/17/24 0300  Weight: 67.6 kg 67.2 kg 58.2 kg    Physical Exam   GEN: No acute distress.   Neck: No JVD Cardiac: RRR, no  rubs, or gallops. Diastolic murmur more prominent today Respiratory: Clear to auscultation bilaterally. GI: Soft, nontender, non-distended  MS: No edema  Labs   Telemetry: SR with PVCs and runs of NSVT   Chemistry Recent Labs  Lab 04/14/24 1018 04/14/24 1736 04/15/24 0343 04/16/24 0553 04/17/24 0231  NA 143 140 137 136 134*  K 3.8 3.8 3.7 3.4* 4.4  CL 106 109 105 101 100  CO2 19* 20* 20* 22 21*  GLUCOSE 123* 125* 145* 115* 125*  BUN 18 21 22  28* 37*  CREATININE 1.29* 1.53* 1.47* 1.53* 1.86*  CALCIUM  10.0 9.4 9.0 9.4 9.1  PROT 8.3* 6.9 6.4*  --   --   ALBUMIN 4.7 3.5 3.3*  --  3.5  AST 19 17 19   --   --   ALT 9 10 10   --   --   ALKPHOS 76 54 53  --   --   BILITOT 0.9 1.1 1.1  --   --   GFRNONAA >60 51* 54* 51* 41*  ANIONGAP 18* 11 12 13 13      Hematology Recent Labs  Lab 04/15/24 0343 04/16/24 0553 04/17/24 0231  WBC 9.5 9.1 8.6  RBC 3.28* 4.09* 4.48  HGB 9.8* 12.2* 13.4  HCT 29.5* 36.6* 40.9  MCV 89.9 89.5 91.3  MCH 29.9 29.8 29.9  MCHC 33.2 33.3 32.8  RDW 16.7* 17.2* 17.5*  PLT 306 366 387    BNP Recent Labs   Lab 04/14/24 1018 04/17/24 0231  BNP  --  616.6*  PROBNP 5,937.0*  --      Cardiac Studies   Cardiac Studies & Procedures   ______________________________________________________________________________________________ CARDIAC CATHETERIZATION  CARDIAC CATHETERIZATION 02/06/2021  Conclusion  Mid LAD lesion is 15% stenosed.  Very low right heart pressures.  No significant coronary obstructive disease with very large LAD with minimal 15% narrowing after a large bifurcating first diagonal vessel and an LAD that wraps around the apex to supply the distal third of the inferior wall; all nondominant left circumflex vessel; large dominant normal RCA.  RECOMMENDATION: Findings are consistent with a nonischemic cardiomyopathy.  Right heart pressures are extremely low.  Will gently hydrate post procedure in this patient with EF of 25 to 30%.  Findings Coronary Findings Diagnostic  Dominance: Right  Left Anterior Descending Vessel is large. Mid LAD lesion is 15% stenosed.  First Diagonal Branch Vessel is large in size.  Left Circumflex Vessel  is small.  Intervention  No interventions have been documented.   STRESS TESTS  NM MYOCAR MULTI W/SPECT W 11/13/2018  Narrative CLINICAL DATA:  Congestive heart failure. Hyperlipidemia, hypertension, smoking, and family history of coronary artery disease.  EXAM: MYOCARDIAL IMAGING WITH SPECT (REST AND PHARMACOLOGIC-STRESS)  GATED LEFT VENTRICULAR WALL MOTION STUDY  LEFT VENTRICULAR EJECTION FRACTION  TECHNIQUE: Standard myocardial SPECT imaging was performed after resting intravenous injection of 10 mCi Tc-6m tetrofosmin . Subsequently, intravenous infusion of Lexiscan  was performed under the supervision of the Cardiology staff. At peak effect of the drug, 30 mCi Tc-51m tetrofosmin  was injected intravenously and standard myocardial SPECT imaging was performed. Quantitative gated imaging was also performed to evaluate left  ventricular wall motion, and estimate left ventricular ejection fraction.  COMPARISON:  None.  FINDINGS: Perfusion: A large fixed myocardial perfusion defect is seen involving the inferior wall of the left ventricle on both stress and resting images, which shows no evidence of reversibility. No reversible myocardial perfusion defects are seen.  Wall Motion: Inferior wall akinesis of left ventricle demonstrated. Moderate to severe left ventricular dilatation.  Left Ventricular Ejection Fraction: 34 %  End diastolic volume 242 ml  End systolic volume 161 ml  IMPRESSION: 1. No evidence of pharmacologic induced myocardial ischemia. Large inferior wall myocardial infarct.  2. Inferior wall akinesis of left ventricle, with moderate to severe dilatation.  3. Left ventricular ejection fraction 34%  4. Non invasive risk stratification*: High  *2012 Appropriate Use Criteria for Coronary Revascularization Focused Update: J Am Coll Cardiol. 2012;59(9):857-881. http://content.dementiazones.com.aspx?articleid=1201161   Electronically Signed By: Nicanor Barge M.D. On: 11/13/2018 13:10   ECHOCARDIOGRAM  ECHOCARDIOGRAM COMPLETE 04/15/2024  Narrative ECHOCARDIOGRAM REPORT    Patient Name:   Darius Rivers Date of Exam: 04/15/2024 Medical Rec #:  [id number]        Height:       76.5 in Accession #:    [id number]       Weight:       152.1 lb Date of Birth:  [birth year]         BSA:          1.982 m Patient Age:    63 years         BP:           118/73 mmHg Patient Gender: M                HR:           71 bpm. Exam Location:  Inpatient  Procedure: 2D Echo, Cardiac Doppler, Color Doppler and Intracardiac Opacification Agent (Both Spectral and Color Flow Doppler were utilized during procedure).  Indications:    CHF  History:        Patient has prior history of Echocardiogram examinations, most recent 02/04/2021. CHF, CAD; Risk Factors:Hypertension.  Sonographer:    Astrid Blamer Referring Phys: [phone] EKTA V PATEL  IMPRESSIONS   1. Left ventricular ejection fraction, by estimation, is <20%. The left ventricle has severely decreased function. The left ventricle demonstrates global hypokinesis. The left ventricular internal cavity size was severely dilated. There is moderate left ventricular hypertrophy. Left ventricular diastolic parameters are consistent with Grade I diastolic dysfunction (impaired relaxation). 2. Right ventricular systolic function is normal. The right ventricular size is normal. Tricuspid regurgitation signal is inadequate for assessing PA pressure. 3. Left atrial size was severely dilated. 4. The mitral valve is normal in structure. Mild mitral valve regurgitation. No evidence of mitral stenosis. 5. The aortic valve  was not well visualized. Aortic valve regurgitation is moderate. Aortic valve sclerosis/calcification is present, without any evidence of aortic stenosis. 6. The inferior vena cava is normal in size with greater than 50% respiratory variability, suggesting right atrial pressure of 3 mmHg.  FINDINGS Left Ventricle: Left ventricular ejection fraction, by estimation, is <20%. The left ventricle has severely decreased function. The left ventricle demonstrates global hypokinesis. The left ventricular internal cavity size was severely dilated. There is moderate left ventricular hypertrophy. Left ventricular diastolic parameters are consistent with Grade I diastolic dysfunction (impaired relaxation).  Right Ventricle: The right ventricular size is normal. No increase in right ventricular wall thickness. Right ventricular systolic function is normal. Tricuspid regurgitation signal is inadequate for assessing PA pressure.  Left Atrium: Left atrial size was severely dilated.  Right Atrium: Right atrial size was normal in size.  Pericardium: There is no evidence of pericardial effusion.  Mitral Valve: The mitral valve is normal in  structure. Mild mitral valve regurgitation. No evidence of mitral valve stenosis.  Tricuspid Valve: The tricuspid valve is normal in structure. Tricuspid valve regurgitation is trivial.  Aortic Valve: The aortic valve was not well visualized. Aortic valve regurgitation is moderate. Aortic valve sclerosis/calcification is present, without any evidence of aortic stenosis. Aortic valve mean gradient measures 4.0 mmHg. Aortic valve peak gradient measures 7.2 mmHg. Aortic valve area, by VTI measures 2.40 cm.  Pulmonic Valve: The pulmonic valve was not well visualized. Pulmonic valve regurgitation is not visualized.  Aorta: The aortic root is normal in size and structure.  Venous: The inferior vena cava is normal in size with greater than 50% respiratory variability, suggesting right atrial pressure of 3 mmHg.  IAS/Shunts: The atrial septum is grossly normal.   LEFT VENTRICLE PLAX 2D LVIDd:         7.20 cm   Diastology LVIDs:         6.60 cm   LV e' medial:    6.20 cm/s LV PW:         1.40 cm   LV E/e' medial:  7.1 LV IVS:        1.30 cm   LV e' lateral:   10.10 cm/s LVOT diam:     2.00 cm   LV E/e' lateral: 4.4 LV SV:         52 LV SV Index:   26 LVOT Area:     3.14 cm   RIGHT VENTRICLE RV S prime:     15.20 cm/s TAPSE (M-mode): 2.6 cm  LEFT ATRIUM              Index        RIGHT ATRIUM           Index LA Vol (A2C):   180.0 ml 90.83 ml/m  RA Area:     13.60 cm LA Vol (A4C):   60.5 ml  30.53 ml/m  RA Volume:   32.90 ml  16.60 ml/m LA Biplane Vol: 113.0 ml 57.02 ml/m AORTIC VALVE AV Area (Vmax):    2.17 cm AV Area (Vmean):   2.25 cm AV Area (VTI):     2.40 cm AV Vmax:           134.00 cm/s AV Vmean:          88.000 cm/s AV VTI:            0.216 m AV Peak Grad:      7.2 mmHg AV Mean Grad:  4.0 mmHg LVOT Vmax:         92.60 cm/s LVOT Vmean:        62.900 cm/s LVOT VTI:          0.165 m LVOT/AV VTI ratio: 0.76  AORTA Ao Root diam: 3.70 cm  MITRAL VALVE MV  Area (PHT): 4.33 cm    SHUNTS MV Decel Time: 175 msec    Systemic VTI:  0.16 m MV E velocity: 44.00 cm/s  Systemic Diam: 2.00 cm MV A velocity: 92.20 cm/s MV E/A ratio:  0.48  Carson Clara MD Electronically signed by Carson Clara MD Signature Date/Time: 04/15/2024/3:00:09 PM    Final        CARDIAC MRI  MR CARDIAC MORPHOLOGY W WO CONTRAST 12/24/2023  Narrative CLINICAL DATA:  Clinical question of heart failure Study assumes HCT of 35 and BSA of 2.00.  EXAM: CARDIAC MRI  TECHNIQUE: The patient was scanned on a 1.5 Tesla GE magnet. A dedicated cardiac coil was used. Functional imaging was done using Fiesta sequences. 2,3, and 4 chamber views were done to assess for RWMA's. Modified Simpson's rule using a short axis stack was used to calculate an ejection fraction on a dedicated work [employer]. The patient received 7 cc of Gadavist . After 10 minutes inversion recovery sequences were used to assess for infiltration and scar tissue. Flow quantification was performed 2 times during this examination with flow quantification performed at the levels of the ascending aorta above the valve, pulmonary artery above the valve.  CONTRAST:  7 cc  of Gadavist   FINDINGS: 1. Severe increase in left ventricular size, with LVEDD 79 mm, and LVEDVi 220 mL/m2.  Normal left ventricular thickness.  Severe decrease in left ventricular systolic function (LVEF = 27%). Septum is notably hypokinetic.  Left ventricular parametric mapping notable for elevated ECV in the basal inferoseptal (57%).  There is late gadolinium enhancement in the left ventricular myocardium. LGE noted in the basal inferoseptal.  2.  Mild dilation right ventricular size with RVEDVI 103 mL/m2.  Normal right ventricular thickness.  Mild decrease in right ventricular systolic function (RVEF =42%). Septal hypokinesis.  3.  Normal right atrial size.  Severe left atrial  dilation.  4.  Moderate ascending aortic dilation 46 mm.  Mild aortic root dilation 43 mm. This is marred by free breathing artifact.  5. Valve assessment:  Aortic Valve: Tri-leaflet valve. Moderate aortic regurgitation, regurgitant fraction 30%.  Pulmonic Valve: Qualitatively, there is no significant regurgitation. Regurgitant fraction 4%.  Tricuspid Valve: Qualitatively, there mild significant regurgitation.  Mitral Valve: Qualitatively, there is mild significant regurgitation. Regurgitant fraction 13%.  6.  Normal pericardium.  No pericardial effusion.  7.  See Radiology report for non-cardiac reporting.  8. Breath hold artifacts noted. Suboptimal gating in short axis series.  IMPRESSION: 1.  Severe left ventricular dysfunction with dilated cardiomyopathy.  2. Mild right ventricular dysfunction related to septal dysfunction from the LV.  3. Moderate aortic aneurysm with mild aortic dilation on sinus to sinus evaluation.  4.  Moderate central aortic regurgitation.  Gloriann Larger MD   Electronically Signed By: Gloriann Larger M.D. On: 01/04/2024 12:56   ______________________________________________________________________________________________           Assessment & Plan   #Acute on chronic dilated, combined HFrEF #NICM # LBBB - NYHA II by symptom burden - A component of this is related to septal dyssnychrony; LVEF < 35% despite GDMT from February to June with stated compliance - has had  some dietary non adherence - Follow strict I's and O's, daily weights and renal function while diuresing - Replete K+ to keep >4  and Mag > 2; he is having NSVT today ;standing K has been given  Patient is now having hypotension with AKI - patient could be euvolemic; clinically appears so - despite GDMT he has not have improvement in his LVEF from February to June; Moderate AI that does not fully explain his decreased EF - Has a narrow pulse  pressure despite AI - I have asked our AHF team to see (Dr. Gaynel Keel) - Backing of his GDMT for now - I have called his daughter, September White, at his request while in the room; he and I left a voicemail giving her f/u information on our plan   #Hypertensive urgency - complicated by dietary non adherence; on GDMT his BP is low; this may be related to medication non adherence No with hypotension    CAD Type II elevation in troponin < 100  HLD - Continue on ASA 81 mg daily, Zetia  10 mg daily, Crestor  40 mg daily    For questions or updates, please contact [facility] Please consult [email or web address] for contact info under Cardiology/STEMI.      Gloriann Larger, MD FASE [facility] Cardiologist [facility]  [address] [place], [address] [place], [place] [address] [phone]  10:18 AM

## 2024-04-17 NOTE — H&P (View-Only) (Signed)
 Advanced Heart Failure Team Consult Note   Primary Physician: Alejo Amsler, PA Cardiologist:  Jann Melody, MD  Reason for Consultation: HF  HPI:    Darius Rivers is seen today for evaluation of acute on chronic systolic HF at the request of Dr. Dereck Flavin.  Darius Rivers is a 63 y/o male with systolic HF due to NICM, CAD, NSVT, CKD, HTN, HL, LBBB and asc Ao aneurysm (4.6cm)    Diagnosed with HF in 2017 @ [facility]. EF 20% Cath with non-obs CAD  Admitted 01/20 for N&V, abd pain, hypotension and lactic acidosis. Elevated troponin>>peak 1.57>>echo w/ EF 40-45%>>MV EF 34% w/inferior scar but no ischemia, no cath 2nd acute on chronic CKD (peak Cr 4.78), meds adjusted. Cr at d/c 1.26   Echo 4/22 EF 25-30%% -> cath LAD 15% otherwise normal cors. RA 0 PA 5/1 PCWP 1  Fick 4.4/2.1  Admitted 2/25 for ADHF in setting of medication non-compliance. cMRI 2/25: Severely dilated LV (7.9 cm) EF 27%  + LGE in basilar inferoseptal wall No LVH. RVEF 42% AscAo aneurysm 4.6cm   Admitted 04/14/24 with decompensated HF in setting of HTN crisis. Echo EF < 20 RV normal  Moderate AI  In ED BP 160/110. Reported compliance with HF meds. (Had been noncompliant in past)   Diuresing with IV lasix . Scr 1.5 -> 1.8  Says he feels good now. Denies CP or SOB. 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 Says he can do whatever he wants. No SOB, edema, orthopnea or PND. Had a LifeVest in past but refused ICD. Drinks several drinks per week (won't quantify more). Smokes marijuana.     Home Medications Prior to Admission medications   Medication Sig Start Date End Date Taking? Authorizing Provider  aspirin  EC 81 MG tablet Take 81 mg by mouth daily.   Yes [provider]  carvedilol  (COREG ) 25 MG tablet Take 1 tablet (25 mg total) by mouth 2 (two) times daily with a meal. 11/07/22  Yes Sonny Dust, MD  empagliflozin  (JARDIANCE ) 10 MG TABS tablet Take  1 tablet (10 mg total) by mouth daily before breakfast. 12/23/23  Yes Sheryl Donna, NP  ezetimibe  (ZETIA ) 10 MG tablet Take 1 tablet (10 mg total) by mouth daily. 04/06/24  Yes Sheryl Donna, NP  furosemide  (LASIX ) 40 MG tablet Take 1 tablet (40 mg total) by mouth daily as needed for fluid or edema (weight gain >3 lbs in one day or >5 lbs in one week). 12/15/23 12/14/24 Yes Debria Fang, PA-C  hydrALAZINE  (APRESOLINE ) 100 MG tablet Take 1 tablet (100 mg total) by mouth 3 (three) times daily. 09/30/22  Yes Sonny Dust, MD  isosorbide  mononitrate (IMDUR ) 30 MG 24 hr tablet Take 3 tablets (90 mg total) by mouth 2 (two) times daily. 02/04/23  Yes Sonny Dust, MD  rosuvastatin  (CRESTOR ) 40 MG tablet Take 1 tablet (40 mg total) by mouth daily at 6 PM. 12/08/22  Yes Pemberton, Rumaldo Countess, MD  sacubitril -valsartan  (ENTRESTO ) 97-103 MG Take 1 tablet by mouth 2 (two) times daily. 04/06/24  Yes Sheryl Donna, NP  spironolactone  (ALDACTONE ) 25 MG tablet Take 1 tablet (25 mg total) by mouth daily. 01/01/23  Yes Sonny Dust, MD   Medications:   aspirin  EC  81 mg Oral Daily   empagliflozin   10 mg Oral Daily   ezetimibe   10 mg Oral Daily   feeding supplement  237 mL Oral BID BM   heparin   5,000 Units Subcutaneous Q8H   multivitamin with minerals  1 tablet Oral Daily   potassium chloride   40 mEq Oral BID   rosuvastatin   40 mg Oral q1800   sodium chloride  flush  3 mL Intravenous Q12H   spironolactone   25 mg Oral Daily      Past Medical History: Past Medical History:  Diagnosis Date   Chronic kidney disease stage II    Coronary artery disease    NSTEMI in 11/2018 - Lg scar on Myoview ; Rx medically due to CKD // Cath 4/22: mLAD 15 (no sig CAD)   HFrEF (heart failure with reduced EF)    NICM // Echocardiogram 11/2018: EF 40-45 // Echocardiogram 4/22:  EF 25-30 // Echo 7/22: EF 40-45, global HK normal RVSF, mild LAE, mild MR, moderate AI, mild AV sclerosis without stenosis, mild dilation  of aortic root (42 mm)   Hyperlipidemia    Hypertension    Lung nodule    CT 7/22: Left upper lobe 3 mm> repeat chest CT in 1 year   Nephrolithiasis    NSVT (nonsustained ventricular tachycardia)    Prostate cancer (HCC)    watchful waiting, due to go back to urology   Thoracic ascending aortic aneurysm    Echo 4/22: 44 mm // Chest CTA 7/22: Ascending thoracic aortic aneurysm 4.4 cm.  3 mm left upper lobe nodule.  Aortic atherosclerosis.    Past Surgical History: Past Surgical History:  Procedure Laterality Date   CYSTOSCOPY WITH URETEROSCOPY, STONE BASKETRY AND STENT PLACEMENT     put a stent in; pulled stent out later   INGUINAL HERNIA REPAIR Bilateral 1980s   PROSTATE BIOPSY     RIGHT/LEFT HEART CATH AND CORONARY ANGIOGRAPHY N/A 02/06/2021   Procedure: RIGHT/LEFT HEART CATH AND CORONARY ANGIOGRAPHY;  Surgeon: Millicent Ally, MD;  Location: [facility];  Service: Cardiovascular;  Laterality: N/A;    Family History: Family History  Problem Relation Age of Onset   High blood pressure Mother    Heart attack Neg Hx    Cancer Neg Hx    Diabetes Neg Hx     Social History: Social History   Socioeconomic History   Marital status: Divorced    Spouse name: Not on file   Number of children: 2   Years of education: Not on file   Highest education level: High school graduate  Occupational History   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  Tobacco Use   Smoking status: Former    Current packs/day: 0.00    Average packs/day: 0.3 packs/day for 20.0 years (6.6 ttl pk-yrs)    Types: Cigarettes    Start date: 11/11/1978    Quit date: 11/11/1998    Years since quitting: 25.4   Smokeless tobacco: Never  Vaping Use   Vaping status: Never Used  Substance and Sexual Activity   Alcohol use: Not Currently   Drug use: Yes    Types: Marijuana    Comment: 2 x per month   Sexual activity: Yes  Other  Topics Concern   Not on file  Social History Narrative   Not on file   Social Drivers of Health   Financial Resource Strain: Low Risk  (04/15/2024)   Overall Financial Resource Strain (CARDIA)    Difficulty of Paying Living Expenses: Not very hard  Food Insecurity: No Food Insecurity (  04/14/2024)   Hunger Vital Sign    Worried About Running Out of Food in the Last Year: Never true    Ran Out of Food in the Last Year: Never true  Transportation Needs: No Transportation Needs (04/14/2024)   PRAPARE - Administrator, Civil Service (Medical): No    Lack of Transportation (Non-Medical): No  Physical Activity: Not on file  Stress: Not on file  Social Connections: Socially Isolated (04/14/2024)   Social Connection and Isolation Panel    Frequency of Communication with Friends and Family: Three times a week    Frequency of Social Gatherings with Friends and Family: More than three times a week    Attends Religious Services: Never    Database administrator or Organizations: No    Attends Banker Meetings: Never    Marital Status: Widowed    Allergies:  No Known Allergies  Objective:    Vital Signs:   Temp:  [97.6 F (36.4 C)-98.4 F (36.9 C)] 98.4 F (36.9 C) (06/15 0727) Pulse Rate:  [64-80] 72 (06/15 0600) Resp:  [17-18] 17 (06/15 0727) BP: (83-106)/(59-84) 92/72 (06/15 0727) SpO2:  [93 %-100 %] 98 % (06/15 0600) Weight:  [58.2 kg-67.2 kg] 58.2 kg (06/15 0300) Last BM Date : 04/17/24  Weight change: Filed Weights   04/16/24 0311 04/16/24 1235 04/17/24 0300  Weight: 67.6 kg 67.2 kg 58.2 kg    Intake/Output:   Intake/Output Summary (Last 24 hours) at 04/17/2024 1041 Last data filed at 04/17/2024 0833 Gross per 24 hour  Intake 1425.98 ml  Output 1570 ml  Net -144.02 ml      Physical Exam    General:  Cachetic. No resp difficulty HEENT: normal Neck: supple. JVP flat . Carotids 2+ bilat; no bruits. No lymphadenopathy or thyromegaly  appreciated. Cor: PMI laterally displaced. Regular rate & rhythm. No rubs, gallops or murmurs. Lungs: clear Abdomen: soft, nontender, nondistended. No hepatosplenomegaly. No bruits or masses. Good bowel sounds. Extremities: no cyanosis, clubbing, rash, edema Neuro: alert & orientedx3, cranial nerves grossly intact. moves all 4 extremities w/o difficulty. Affect pleasant   Telemetry   Sinus 70-80s Personally reviewed  EKG    Sinus tach 102 LBBB (142 ms. Was in 2/25) + PVCs Personally reviewed  Labs   Basic Metabolic Panel: Recent Labs  Lab 04/14/24 1018 04/14/24 1224 04/14/24 1736 04/15/24 0343 04/16/24 0553 04/17/24 0231  NA 143  --  140 137 136 134*  K 3.8  --  3.8 3.7 3.4* 4.4  CL 106  --  109 105 101 100  CO2 19*  --  20* 20* 22 21*  GLUCOSE 123*  --  125* 145* 115* 125*  BUN 18  --  21 22 28* 37*  CREATININE 1.29*  --  1.53* 1.47* 1.53* 1.86*  CALCIUM  10.0  --  9.4 9.0 9.4 9.1  MG  --  1.9 1.8 2.0 1.9 2.2  PHOS  --   --   --   --   --  4.2    Liver Function Tests: Recent Labs  Lab 04/14/24 1018 04/14/24 1736 04/15/24 0343 04/17/24 0231  AST 19 17 19   --   ALT 9 10 10   --   ALKPHOS 76 54 53  --   BILITOT 0.9 1.1 1.1  --   PROT 8.3* 6.9 6.4*  --   ALBUMIN 4.7 3.5 3.3* 3.5   No results for input(s): LIPASE, AMYLASE in the last 168 hours. No  results for input(s): AMMONIA in the last 168 hours.  CBC: Recent Labs  Lab 04/14/24 1018 04/14/24 1736 04/15/24 0343 04/16/24 0553 04/17/24 0231  WBC 10.9* 12.6* 9.5 9.1 8.6  HGB 10.5* 9.7* 9.8* 12.2* 13.4  HCT 31.6* 29.4* 29.5* 36.6* 40.9  MCV 91.3 91.6 89.9 89.5 91.3  PLT 319 320 306 366 387    Cardiac Enzymes: No results for input(s): CKTOTAL, CKMB, CKMBINDEX, TROPONINI in the last 168 hours.  BNP: BNP (last 3 results) Recent Labs    12/14/23 0945 12/23/23 0933 04/17/24 0231  BNP >4,500.0* 233.1* 616.6*    ProBNP (last 3 results) Recent Labs    04/14/24 1018  PROBNP  5,937.0*     CBG: Recent Labs  Lab 04/15/24 0622 04/15/24 1141 04/15/24 1624 04/15/24 2117 04/16/24 2213  GLUCAP 152* 129* 132* 128* 117*    Coagulation Studies: No results for input(s): LABPROT, INR in the last 72 hours.   Imaging   No results found.   Medications:     Current Medications:  aspirin  EC  81 mg Oral Daily   carvedilol   25 mg Oral BID   empagliflozin   10 mg Oral Daily   ezetimibe   10 mg Oral Daily   feeding supplement  237 mL Oral BID BM   heparin   5,000 Units Subcutaneous Q8H   hydrALAZINE   100 mg Oral TID   multivitamin with minerals  1 tablet Oral Daily   potassium chloride   40 mEq Oral BID   rosuvastatin   40 mg Oral q1800   sacubitril -valsartan   1 tablet Oral BID   sodium chloride  flush  3 mL Intravenous Q12H   spironolactone   25 mg Oral Daily    Infusions:    Assessment/Plan   1. Acute on chronic systolic HF due to NICM - onset 2017 EF 20%. Cath insig CAD - Echo 2020 EF 40-45% - Echo 4/22 EF 25-30%% -> cath LAD 15% otherwise normal cors.  - Admitted 2/25 for ADHF in setting of medication non-compliance. cMRI 2/25: Severely dilated LV (7.9 cm) EF 27%  + LGE in basilar inferoseptal wall No LVH. RVEF 42% AscAo aneurysm 4.6cm  - Echo 04/15/24 EF < 20 Markedly dilated RV normal  Moderate AI - Etiology unclear. Suspect HTN as priamry factor but also has LBBB ( now; was 126 ms in 2/25), AI and PVCs - Volume status ok  - Continue carvedilol  25 bid - Continue Jardiance  10 daily - Continue Entresto  97/103 bid - Continue spiro 25 daily - has refused ICD in past - He has severe NICM with almost an 8cm LV. Likely end-stage but reports NYHA I symptoms. Given BP response to meds suspect he has been noncompliant at home with full regimen but he denies.  - We discussed RHC and he is agreeable to proceed. Would also consider CPX if RHC unrevealing.  - I think he may be close to advanced therapies but with cachexia and CKD he would be marginal  canddiate - We attempted to call his daughter to discuss but it went to VM. I will reach out to her again tomorrow  2. HTN crisis with HF - suspect med compliance is an issue  3. CKD 3b - b/l Scr 1.6-1.7  4. Aortic valve insufficiency - moderate by echo   5. Ascending thoracic aortic aneurysm - Echocardiogram 4/22:  44 mm - CT 7/22: 44 mm >> rpt 1 year  - cMRI 2/25 46mm  6. PVCs on ECG - will quantify on tele  7. NSVT  -  Keep K> 4.0 Mg > 2,0   Length of Stay: 3  Jules Oar, MD  04/17/2024, 10:41 AM    Advanced Heart Failure Team Pager [phone] (M-F; 7a - 5p)  Please contact [facility] for night-coverage after hours (4p -7a ) and weekends on [email or web address]

## 2024-04-17 NOTE — Consult Note (Signed)
 Advanced Heart Failure Team Consult Note   Primary Physician: Alejo Amsler, PA Cardiologist:  Jann Melody, MD  Reason for Consultation: HF  HPI:    Darius Rivers is seen today for evaluation of acute on chronic systolic HF at the request of Dr. Dereck Flavin.  Darius Rivers is a 63 y/o male with systolic HF due to NICM, CAD, NSVT, CKD, HTN, HL, LBBB and asc Ao aneurysm (4.6cm)    Diagnosed with HF in 2017 @ [facility]. EF 20% Cath with non-obs CAD  Admitted 01/20 for N&V, abd pain, hypotension and lactic acidosis. Elevated troponin>>peak 1.57>>echo w/ EF 40-45%>>MV EF 34% w/inferior scar but no ischemia, no cath 2nd acute on chronic CKD (peak Cr 4.78), meds adjusted. Cr at d/c 1.26   Echo 4/22 EF 25-30%% -> cath LAD 15% otherwise normal cors. RA 0 PA 5/1 PCWP 1  Fick 4.4/2.1  Admitted 2/25 for ADHF in setting of medication non-compliance. cMRI 2/25: Severely dilated LV (7.9 cm) EF 27%  + LGE in basilar inferoseptal wall No LVH. RVEF 42% AscAo aneurysm 4.6cm   Admitted 04/14/24 with decompensated HF in setting of HTN crisis. Echo EF < 20 RV normal  Moderate AI  In ED BP 160/110. Reported compliance with HF meds. (Had been noncompliant in past)   Diuresing with IV lasix . Scr 1.5 -> 1.8  Says he feels good now. Denies CP or SOB. 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 Says he can do whatever he wants. No SOB, edema, orthopnea or PND. Had a LifeVest in past but refused ICD. Drinks several drinks per week (won't quantify more). Smokes marijuana.     Home Medications Prior to Admission medications   Medication Sig Start Date End Date Taking? Authorizing Provider  aspirin  EC 81 MG tablet Take 81 mg by mouth daily.   Yes [provider]  carvedilol  (COREG ) 25 MG tablet Take 1 tablet (25 mg total) by mouth 2 (two) times daily with a meal. 11/07/22  Yes Sonny Dust, MD  empagliflozin  (JARDIANCE ) 10 MG TABS tablet Take  1 tablet (10 mg total) by mouth daily before breakfast. 12/23/23  Yes Sheryl Donna, NP  ezetimibe  (ZETIA ) 10 MG tablet Take 1 tablet (10 mg total) by mouth daily. 04/06/24  Yes Sheryl Donna, NP  furosemide  (LASIX ) 40 MG tablet Take 1 tablet (40 mg total) by mouth daily as needed for fluid or edema (weight gain >3 lbs in one day or >5 lbs in one week). 12/15/23 12/14/24 Yes Debria Fang, PA-C  hydrALAZINE  (APRESOLINE ) 100 MG tablet Take 1 tablet (100 mg total) by mouth 3 (three) times daily. 09/30/22  Yes Sonny Dust, MD  isosorbide  mononitrate (IMDUR ) 30 MG 24 hr tablet Take 3 tablets (90 mg total) by mouth 2 (two) times daily. 02/04/23  Yes Sonny Dust, MD  rosuvastatin  (CRESTOR ) 40 MG tablet Take 1 tablet (40 mg total) by mouth daily at 6 PM. 12/08/22  Yes Pemberton, Rumaldo Countess, MD  sacubitril -valsartan  (ENTRESTO ) 97-103 MG Take 1 tablet by mouth 2 (two) times daily. 04/06/24  Yes Sheryl Donna, NP  spironolactone  (ALDACTONE ) 25 MG tablet Take 1 tablet (25 mg total) by mouth daily. 01/01/23  Yes Sonny Dust, MD   Medications:   aspirin  EC  81 mg Oral Daily   empagliflozin   10 mg Oral Daily   ezetimibe   10 mg Oral Daily   feeding supplement  237 mL Oral BID BM   heparin   5,000 Units Subcutaneous Q8H   multivitamin with minerals  1 tablet Oral Daily   potassium chloride   40 mEq Oral BID   rosuvastatin   40 mg Oral q1800   sodium chloride  flush  3 mL Intravenous Q12H   spironolactone   25 mg Oral Daily      Past Medical History: Past Medical History:  Diagnosis Date   Chronic kidney disease stage II    Coronary artery disease    NSTEMI in 11/2018 - Lg scar on Myoview ; Rx medically due to CKD // Cath 4/22: mLAD 15 (no sig CAD)   HFrEF (heart failure with reduced EF)    NICM // Echocardiogram 11/2018: EF 40-45 // Echocardiogram 4/22:  EF 25-30 // Echo 7/22: EF 40-45, global HK normal RVSF, mild LAE, mild MR, moderate AI, mild AV sclerosis without stenosis, mild dilation  of aortic root (42 mm)   Hyperlipidemia    Hypertension    Lung nodule    CT 7/22: Left upper lobe 3 mm> repeat chest CT in 1 year   Nephrolithiasis    NSVT (nonsustained ventricular tachycardia)    Prostate cancer (HCC)    watchful waiting, due to go back to urology   Thoracic ascending aortic aneurysm    Echo 4/22: 44 mm // Chest CTA 7/22: Ascending thoracic aortic aneurysm 4.4 cm.  3 mm left upper lobe nodule.  Aortic atherosclerosis.    Past Surgical History: Past Surgical History:  Procedure Laterality Date   CYSTOSCOPY WITH URETEROSCOPY, STONE BASKETRY AND STENT PLACEMENT     put a stent in; pulled stent out later   INGUINAL HERNIA REPAIR Bilateral 1980s   PROSTATE BIOPSY     RIGHT/LEFT HEART CATH AND CORONARY ANGIOGRAPHY N/A 02/06/2021   Procedure: RIGHT/LEFT HEART CATH AND CORONARY ANGIOGRAPHY;  Surgeon: Millicent Ally, MD;  Location: [facility];  Service: Cardiovascular;  Laterality: N/A;    Family History: Family History  Problem Relation Age of Onset   High blood pressure Mother    Heart attack Neg Hx    Cancer Neg Hx    Diabetes Neg Hx     Social History: Social History   Socioeconomic History   Marital status: Divorced    Spouse name: Not on file   Number of children: 2   Years of education: Not on file   Highest education level: High school graduate  Occupational History   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  Tobacco Use   Smoking status: Former    Current packs/day: 0.00    Average packs/day: 0.3 packs/day for 20.0 years (6.6 ttl pk-yrs)    Types: Cigarettes    Start date: 11/11/1978    Quit date: 11/11/1998    Years since quitting: 25.4   Smokeless tobacco: Never  Vaping Use   Vaping status: Never Used  Substance and Sexual Activity   Alcohol use: Not Currently   Drug use: Yes    Types: Marijuana    Comment: 2 x per month   Sexual activity: Yes  Other  Topics Concern   Not on file  Social History Narrative   Not on file   Social Drivers of Health   Financial Resource Strain: Low Risk  (04/15/2024)   Overall Financial Resource Strain (CARDIA)    Difficulty of Paying Living Expenses: Not very hard  Food Insecurity: No Food Insecurity (  04/14/2024)   Hunger Vital Sign    Worried About Running Out of Food in the Last Year: Never true    Ran Out of Food in the Last Year: Never true  Transportation Needs: No Transportation Needs (04/14/2024)   PRAPARE - Administrator, Civil Service (Medical): No    Lack of Transportation (Non-Medical): No  Physical Activity: Not on file  Stress: Not on file  Social Connections: Socially Isolated (04/14/2024)   Social Connection and Isolation Panel    Frequency of Communication with Friends and Family: Three times a week    Frequency of Social Gatherings with Friends and Family: More than three times a week    Attends Religious Services: Never    Database administrator or Organizations: No    Attends Banker Meetings: Never    Marital Status: Widowed    Allergies:  No Known Allergies  Objective:    Vital Signs:   Temp:  [97.6 F (36.4 C)-98.4 F (36.9 C)] 98.4 F (36.9 C) (06/15 0727) Pulse Rate:  [64-80] 72 (06/15 0600) Resp:  [17-18] 17 (06/15 0727) BP: (83-106)/(59-84) 92/72 (06/15 0727) SpO2:  [93 %-100 %] 98 % (06/15 0600) Weight:  [58.2 kg-67.2 kg] 58.2 kg (06/15 0300) Last BM Date : 04/17/24  Weight change: Filed Weights   04/16/24 0311 04/16/24 1235 04/17/24 0300  Weight: 67.6 kg 67.2 kg 58.2 kg    Intake/Output:   Intake/Output Summary (Last 24 hours) at 04/17/2024 1041 Last data filed at 04/17/2024 0833 Gross per 24 hour  Intake 1425.98 ml  Output 1570 ml  Net -144.02 ml      Physical Exam    General:  Cachetic. No resp difficulty HEENT: normal Neck: supple. JVP flat . Carotids 2+ bilat; no bruits. No lymphadenopathy or thyromegaly  appreciated. Cor: PMI laterally displaced. Regular rate & rhythm. No rubs, gallops or murmurs. Lungs: clear Abdomen: soft, nontender, nondistended. No hepatosplenomegaly. No bruits or masses. Good bowel sounds. Extremities: no cyanosis, clubbing, rash, edema Neuro: alert & orientedx3, cranial nerves grossly intact. moves all 4 extremities w/o difficulty. Affect pleasant   Telemetry   Sinus 70-80s Personally reviewed  EKG    Sinus tach 102 LBBB (142 ms. Was in 2/25) + PVCs Personally reviewed  Labs   Basic Metabolic Panel: Recent Labs  Lab 04/14/24 1018 04/14/24 1224 04/14/24 1736 04/15/24 0343 04/16/24 0553 04/17/24 0231  NA 143  --  140 137 136 134*  K 3.8  --  3.8 3.7 3.4* 4.4  CL 106  --  109 105 101 100  CO2 19*  --  20* 20* 22 21*  GLUCOSE 123*  --  125* 145* 115* 125*  BUN 18  --  21 22 28* 37*  CREATININE 1.29*  --  1.53* 1.47* 1.53* 1.86*  CALCIUM  10.0  --  9.4 9.0 9.4 9.1  MG  --  1.9 1.8 2.0 1.9 2.2  PHOS  --   --   --   --   --  4.2    Liver Function Tests: Recent Labs  Lab 04/14/24 1018 04/14/24 1736 04/15/24 0343 04/17/24 0231  AST 19 17 19   --   ALT 9 10 10   --   ALKPHOS 76 54 53  --   BILITOT 0.9 1.1 1.1  --   PROT 8.3* 6.9 6.4*  --   ALBUMIN 4.7 3.5 3.3* 3.5   No results for input(s): LIPASE, AMYLASE in the last 168 hours. No  results for input(s): AMMONIA in the last 168 hours.  CBC: Recent Labs  Lab 04/14/24 1018 04/14/24 1736 04/15/24 0343 04/16/24 0553 04/17/24 0231  WBC 10.9* 12.6* 9.5 9.1 8.6  HGB 10.5* 9.7* 9.8* 12.2* 13.4  HCT 31.6* 29.4* 29.5* 36.6* 40.9  MCV 91.3 91.6 89.9 89.5 91.3  PLT 319 320 306 366 387    Cardiac Enzymes: No results for input(s): CKTOTAL, CKMB, CKMBINDEX, TROPONINI in the last 168 hours.  BNP: BNP (last 3 results) Recent Labs    12/14/23 0945 12/23/23 0933 04/17/24 0231  BNP >4,500.0* 233.1* 616.6*    ProBNP (last 3 results) Recent Labs    04/14/24 1018  PROBNP  5,937.0*     CBG: Recent Labs  Lab 04/15/24 0622 04/15/24 1141 04/15/24 1624 04/15/24 2117 04/16/24 2213  GLUCAP 152* 129* 132* 128* 117*    Coagulation Studies: No results for input(s): LABPROT, INR in the last 72 hours.   Imaging   No results found.   Medications:     Current Medications:  aspirin  EC  81 mg Oral Daily   carvedilol   25 mg Oral BID   empagliflozin   10 mg Oral Daily   ezetimibe   10 mg Oral Daily   feeding supplement  237 mL Oral BID BM   heparin   5,000 Units Subcutaneous Q8H   hydrALAZINE   100 mg Oral TID   multivitamin with minerals  1 tablet Oral Daily   potassium chloride   40 mEq Oral BID   rosuvastatin   40 mg Oral q1800   sacubitril -valsartan   1 tablet Oral BID   sodium chloride  flush  3 mL Intravenous Q12H   spironolactone   25 mg Oral Daily    Infusions:    Assessment/Plan   1. Acute on chronic systolic HF due to NICM - onset 2017 EF 20%. Cath insig CAD - Echo 2020 EF 40-45% - Echo 4/22 EF 25-30%% -> cath LAD 15% otherwise normal cors.  - Admitted 2/25 for ADHF in setting of medication non-compliance. cMRI 2/25: Severely dilated LV (7.9 cm) EF 27%  + LGE in basilar inferoseptal wall No LVH. RVEF 42% AscAo aneurysm 4.6cm  - Echo 04/15/24 EF < 20 Markedly dilated RV normal  Moderate AI - Etiology unclear. Suspect HTN as priamry factor but also has LBBB ( now; was 126 ms in 2/25), AI and PVCs - Volume status ok  - Continue carvedilol  25 bid - Continue Jardiance  10 daily - Continue Entresto  97/103 bid - Continue spiro 25 daily - has refused ICD in past - He has severe NICM with almost an 8cm LV. Likely end-stage but reports NYHA I symptoms. Given BP response to meds suspect he has been noncompliant at home with full regimen but he denies.  - We discussed RHC and he is agreeable to proceed. Would also consider CPX if RHC unrevealing.  - I think he may be close to advanced therapies but with cachexia and CKD he would be marginal  canddiate - We attempted to call his daughter to discuss but it went to VM. I will reach out to her again tomorrow  2. HTN crisis with HF - suspect med compliance is an issue  3. CKD 3b - b/l Scr 1.6-1.7  4. Aortic valve insufficiency - moderate by echo   5. Ascending thoracic aortic aneurysm - Echocardiogram 4/22:  44 mm - CT 7/22: 44 mm >> rpt 1 year  - cMRI 2/25 46mm  6. PVCs on ECG - will quantify on tele  7. NSVT  -  Keep K> 4.0 Mg > 2,0   Length of Stay: 3  Jules Oar, MD  04/17/2024, 10:41 AM    Advanced Heart Failure Team Pager [phone] (M-F; 7a - 5p)  Please contact [facility] for night-coverage after hours (4p -7a ) and weekends on [email or web address]

## 2024-04-18 ENCOUNTER — Encounter (HOSPITAL_COMMUNITY)
Admission: EM | Disposition: A | Discharge status: Home / Self Care | Payer: Self-pay | Source: Home / Self Care | Attending: Student

## 2024-04-18 ENCOUNTER — Encounter (HOSPITAL_COMMUNITY): Payer: Self-pay | Admitting: Cardiology

## 2024-04-18 DIAGNOSIS — R0603 Acute respiratory distress: Secondary | ICD-10-CM | POA: Diagnosis not present

## 2024-04-18 HISTORY — PX: RIGHT HEART CATH: CATH118263

## 2024-04-18 LAB — COMPREHENSIVE METABOLIC PANEL WITH GFR
ALT: 9 U/L (ref 0–44)
AST: 12 U/L — ABNORMAL LOW (ref 15–41)
Albumin: 3.1 g/dL — ABNORMAL LOW (ref 3.5–5.0)
Alkaline Phosphatase: 42 U/L (ref 38–126)
Anion gap: 9 (ref 5–15)
BUN: 44 mg/dL — ABNORMAL HIGH (ref 8–23)
CO2: 21 mmol/L — ABNORMAL LOW (ref 22–32)
Calcium: 9 mg/dL (ref 8.9–10.3)
Chloride: 104 mmol/L (ref 98–111)
Creatinine, Ser: 1.63 mg/dL — ABNORMAL HIGH (ref 0.61–1.24)
GFR, Estimated: 48 mL/min — ABNORMAL LOW (ref >60)
Glucose, Bld: 109 mg/dL — ABNORMAL HIGH (ref 70–99)
Potassium: 5.2 mmol/L — ABNORMAL HIGH (ref 3.5–5.1)
Sodium: 134 mmol/L — ABNORMAL LOW (ref 135–145)
Total Bilirubin: 0.7 mg/dL (ref 0.0–1.2)
Total Protein: 6.2 g/dL — ABNORMAL LOW (ref 6.5–8.1)

## 2024-04-18 LAB — POCT I-STAT EG7
Acid-base deficit: 2 mmol/L (ref 0.0–2.0)
Acid-base deficit: 2 mmol/L (ref 0.0–2.0)
Bicarbonate: 22.4 mmol/L (ref 20.0–28.0)
Bicarbonate: 22.9 mmol/L (ref 20.0–28.0)
Calcium, Ion: 1.22 mmol/L (ref 1.15–1.40)
Calcium, Ion: 1.26 mmol/L (ref 1.15–1.40)
HCT: 36 % — ABNORMAL LOW (ref 39.0–52.0)
HCT: 36 % — ABNORMAL LOW (ref 39.0–52.0)
Hemoglobin: 12.2 g/dL — ABNORMAL LOW (ref 13.0–17.0)
Hemoglobin: 12.2 g/dL — ABNORMAL LOW (ref 13.0–17.0)
O2 Saturation: 58 %
O2 Saturation: 59 %
Potassium: 4.4 mmol/L (ref 3.5–5.1)
Potassium: 4.6 mmol/L (ref 3.5–5.1)
Sodium: 136 mmol/L (ref 135–145)
Sodium: 137 mmol/L (ref 135–145)
TCO2: 24 mmol/L (ref 22–32)
TCO2: 24 mmol/L (ref 22–32)
pCO2, Ven: 37.3 mmHg — ABNORMAL LOW (ref 44–60)
pCO2, Ven: 38 mmHg — ABNORMAL LOW (ref 44–60)
pH, Ven: 7.386 (ref 7.25–7.43)
pH, Ven: 7.389 (ref 7.25–7.43)
pO2, Ven: 31 mmHg — CL (ref 32–45)
pO2, Ven: 31 mmHg — CL (ref 32–45)

## 2024-04-18 LAB — GLUCOSE, CAPILLARY
Glucose-Capillary: 115 mg/dL — ABNORMAL HIGH (ref 70–99)
Glucose-Capillary: 108 mg/dL — ABNORMAL HIGH (ref 70–99)

## 2024-04-18 LAB — MAGNESIUM: Magnesium: 2.3 mg/dL (ref 1.7–2.4)

## 2024-04-18 SURGERY — RIGHT HEART CATH

## 2024-04-18 MED ORDER — LIDOCAINE HCL (PF) 1 % IJ SOLN
INTRAMUSCULAR | Status: AC
  Filled 2024-04-18: qty 30

## 2024-04-18 MED ORDER — HEPARIN (PORCINE) IN NACL 1000-0.9 UT/500ML-% IV SOLN
INTRAVENOUS | Status: DC | PRN
  Administered 2024-04-18: 500 mL

## 2024-04-18 MED ORDER — SODIUM ZIRCONIUM CYCLOSILICATE 10 G PO PACK: 10.0000 g | PACK | Freq: Once | ORAL | Status: DC

## 2024-04-18 MED ORDER — LIDOCAINE HCL (PF) 1 % IJ SOLN
INTRAMUSCULAR | Status: DC | PRN
Start: 2024-04-18 — End: 2024-04-18
  Administered 2024-04-18: 2 mL via INTRADERMAL

## 2024-04-18 MED ORDER — SACUBITRIL-VALSARTAN 24-26 MG PO TABS
1.0000 | ORAL_TABLET | Freq: Two times a day (BID) | ORAL | Status: DC
  Administered 2024-04-18 – 2024-04-19 (×3): 1 via ORAL
  Filled 2024-04-18 (×3): qty 1

## 2024-04-18 MED ORDER — LACTATED RINGERS IV BOLUS
500.0000 mL | Freq: Once | INTRAVENOUS | Status: AC
  Administered 2024-04-18: 500 mL via INTRAVENOUS

## 2024-04-18 SURGICAL SUPPLY — 7 items
CATH SWAN GANZ 7F STRAIGHT (CATHETERS) IMPLANT
GLIDESHEATH SLENDER 7FR .021G (SHEATH) IMPLANT
GUIDEWIRE .025 260CM (WIRE) IMPLANT
PACK CARDIAC CATHETERIZATION (CUSTOM PROCEDURE TRAY) ×1 IMPLANT
SHEATH PROBE COVER 6X72 (BAG) IMPLANT
TRANSDUCER W/STOPCOCK (MISCELLANEOUS) IMPLANT
TUBING ART PRESS 72 MALE/FEM (TUBING) IMPLANT

## 2024-04-18 NOTE — Progress Notes (Signed)
 PROGRESS NOTE  Darius Rivers NUU:[id number] DOB: [birth year]   PCP: Alejo Amsler, PA  Patient is from: Home.  Lives with family.  DOA: 04/14/2024 LOS: 4  Chief complaints Chief Complaint  Patient presents with   Shortness of Breath     Brief Narrative / Interim history: 63 y.o. male with past medical history  of  HFrEF, aneurysm of ascending aorta without rupture, aortic insufficiency, coronary artery disease/NSTEMI in 2020 January,, hypertension, hyperlipidemia, prostate cancer, CKD, presents with concern for shortness of breath ongoing for the past day.  The emergency room patient was found to have proBNP 5937 as well as chest x-ray showing pulmonary vascular congestion concerning for acute on chronic heart failure exacerbation.  Diuresed with IV Lasix  but AKI and hypotension.  Diuretics held.  He GDMT decreased.  Cardiology consulted advanced heart failure team.  Plan for right heart catheterization.  Subjective: Seen and examined earlier this morning.  No major events overnight or this morning.  Was complaining of left groin pain since last night.  He reports history of inguinal.  Pain is worse with moving and standing.  Having bowel movements.  Objective: Vitals:   04/18/24 0324 04/18/24 0334 04/18/24 0744 04/18/24 1043  BP: 110/80  117/84   Pulse: 73     Resp:   20   Temp: 98.7 F (37.1 C)  98.4 F (36.9 C)   TempSrc: Oral  Oral   SpO2: 98%  100% 99%  Weight:  66.4 kg    Height:        Examination:  GENERAL: No apparent distress.  Nontoxic. HEENT: MMM.  Vision and hearing grossly intact.  NECK: Supple.  No apparent JVD.  RESP:  No IWOB.  Fair aeration bilaterally. CVS:  RRR. Heart sounds normal.  ABD/GI/GU: BS+. Abd soft, NTND.  No swelling, tenderness or signs of hernia. MSK/EXT:  Moves extremities. No apparent deformity. No edema.  SKIN: no apparent skin lesion or wound NEURO: AA.  Oriented appropriately.  No apparent focal neuro deficit. PSYCH: Calm.  Normal affect.   Consultants:  Cardiology Advanced heart failure team  Procedures: None  Microbiology summarized: MRSA PCR screen nonreactive A-20 pathogen RVP nonreactive Blood culture NGTD  Assessment and plan: Acute on chronic HFrEF: TTE with LVEF of <20%, GH, G1DD, severe LAE and moderate AR.  proBNP was elevated to 6000.  CXR consistent with HF.  Cardiology diuresing.  Net -2.6 L so far.  Had some AKI and hypotension and diuretics and GDMT held on 6/15.  Normotensive this morning. -AHF on board-resumed low-dose Entresto  -Discontinue Aldactone  given hyperkalemia. -Remains on Jardiance  -Strict intake and output, daily weight, renal functions and electrolytes -Right heart catheterization.  Acute hypoxic respiratory failure due to CHF exacerbation.  Resolved. - Manage CHF as above.  Hypertensive urgency:Soft BP overnight but normotensive this morning. - Diuretics and antihypertensive meds as above  AKI on CKD-3A: Baseline Cr ~1.5.  Likely due to diuretics, Entresto  and hypotension.  Improved Recent Labs    12/04/23 1002 12/14/23 0945 12/14/23 2036 12/15/23 0259 12/23/23 0933 04/14/24 1018 04/14/24 1736 04/15/24 0343 04/16/24 0553 04/17/24 0231 04/18/24 0246  BUN 14 21*  --  27* 23* 18 21 22  28* 37* 44*  CREATININE 1.31* 1.26* 1.57* 1.39* 1.61* 1.29* 1.53* 1.47* 1.53* 1.86* 1.63*  - Cardiac meds on diuretics at adjusted as above - Continue monitoring  Elevated troponin/demand ischemia: Likely due to the above.  TTE as above. History of CAD/STEMI in 2020 - Cardiac meds as above -  Continue Crestor , Zetia , low-dose aspirin   Moderate aortic regurgitation - Per cardiology.  Hyperlipidemia -continue statin and Zetia  as above   Ascending aortic aneurysm without rupture -continue outpatient follow-up  Constipation: Resolved. - Bowel regimen  Hypokalemia - Replenish and recheck as appropriate  Hyperkalemia: Likely from Aldactone  and AKI. -Discontinue  Aldactone . - Lokelma 10 g x 1  Left groin pain: Likely from hernia.  No signs of incarceration.  Having bowel movements. - Monitor  Severe malnutrition Body mass index is 17.59 kg/m. Nutrition Problem: Severe Malnutrition Etiology: chronic illness (CKD, HFrEF) Signs/Symptoms: severe muscle depletion, severe fat depletion Interventions: Ensure Enlive (each supplement provides 350kcal and 20 grams of protein), MVI, Magic cup, Education   DVT prophylaxis:  heparin  injection 5,000 Units Start: 04/14/24 1700  Code Status: Full code Family Communication: None at bedside. Level of care: Progressive Status is: Inpatient Remains inpatient appropriate because: Acute CHF and AKI   Final disposition: Likely home once medically stable   55 minutes with more than 50% spent in reviewing records, counseling patient/family and coordinating care.   Sch Meds:  Scheduled Meds:  [MAR Hold] aspirin  EC  81 mg Oral Daily   [MAR Hold] empagliflozin   10 mg Oral Daily   [MAR Hold] ezetimibe   10 mg Oral Daily   [MAR Hold] feeding supplement  237 mL Oral BID BM   [MAR Hold] heparin   5,000 Units Subcutaneous Q8H   [MAR Hold] multivitamin with minerals  1 tablet Oral Daily   [MAR Hold] rosuvastatin   40 mg Oral q1800   [MAR Hold] sacubitril -valsartan   1 tablet Oral BID   [MAR Hold] sodium chloride  flush  3 mL Intravenous Q12H   [MAR Hold] sodium zirconium cyclosilicate  10 g Oral Once   Continuous Infusions:  sodium chloride  10 mL/hr at 04/18/24 0530   PRN Meds:.[MAR Hold] acetaminophen  **OR** [MAR Hold] acetaminophen , Heparin  (Porcine) in NaCl, [MAR Hold] hydrALAZINE , lidocaine  (PF), [COMPLETED] polyethylene glycol **FOLLOWED BY** [MAR Hold] polyethylene glycol, [COMPLETED] senna-docusate **FOLLOWED BY** [MAR Hold] senna-docusate  Antimicrobials: Anti-infectives (From admission, onward)    None        I have personally reviewed the following labs and images: CBC: Recent Labs  Lab  04/14/24 1018 04/14/24 1736 04/15/24 0343 04/16/24 0553 04/17/24 0231  WBC 10.9* 12.6* 9.5 9.1 8.6  HGB 10.5* 9.7* 9.8* 12.2* 13.4  HCT 31.6* 29.4* 29.5* 36.6* 40.9  MCV 91.3 91.6 89.9 89.5 91.3  PLT 319 320 306 366 387   BMP &GFR Recent Labs  Lab 04/14/24 1736 04/15/24 0343 04/16/24 0553 04/17/24 0231 04/18/24 0246  NA 140 137 136 134* 134*  K 3.8 3.7 3.4* 4.4 5.2*  CL 109 105 101 100 104  CO2 20* 20* 22 21* 21*  GLUCOSE 125* 145* 115* 125* 109*  BUN 21 22 28* 37* 44*  CREATININE 1.53* 1.47* 1.53* 1.86* 1.63*  CALCIUM  9.4 9.0 9.4 9.1 9.0  MG 1.8 2.0 1.9 2.2 2.3  PHOS  --   --   --  4.2  --    Estimated Creatinine Clearance: 44.7 mL/min (A) (by C-G formula based on SCr of 1.63 mg/dL (H)). Liver & Pancreas: Recent Labs  Lab 04/14/24 1018 04/14/24 1736 04/15/24 0343 04/17/24 0231 04/18/24 0246  AST 19 17 19   --  12*  ALT 9 10 10   --  9  ALKPHOS 76 54 53  --  42  BILITOT 0.9 1.1 1.1  --  0.7  PROT 8.3* 6.9 6.4*  --  6.2*  ALBUMIN 4.7  3.5 3.3* 3.5 3.1*   No results for input(s): LIPASE, AMYLASE in the last 168 hours. No results for input(s): AMMONIA in the last 168 hours. Diabetic: No results for input(s): HGBA1C in the last 72 hours.  Recent Labs  Lab 04/15/24 1141 04/15/24 1624 04/15/24 2117 04/16/24 2213 04/18/24 0526  GLUCAP 129* 132* 128* 117* 115*   Cardiac Enzymes: No results for input(s): CKTOTAL, CKMB, CKMBINDEX, TROPONINI in the last 168 hours. Recent Labs    04/14/24 1018  PROBNP 5,937.0*   Coagulation Profile: No results for input(s): INR, PROTIME in the last 168 hours. Thyroid  Function Tests: No results for input(s): TSH, T4TOTAL, FREET4, T3FREE, THYROIDAB in the last 72 hours. Lipid Profile: No results for input(s): CHOL, HDL, LDLCALC, TRIG, CHOLHDL, LDLDIRECT in the last 72 hours.  Anemia Panel: No results for input(s): VITAMINB12, FOLATE, FERRITIN, TIBC, IRON, RETICCTPCT in  the last 72 hours. Urine analysis:    Component Value Date/Time   COLORURINE YELLOW 02/04/2021 0010   APPEARANCEUR CLEAR 02/04/2021 0010   LABSPEC 1.025 02/04/2021 0010   PHURINE 6.0 02/04/2021 0010   GLUCOSEU NEGATIVE 02/04/2021 0010   HGBUR MODERATE (A) 02/04/2021 0010   BILIRUBINUR NEGATIVE 02/04/2021 0010   KETONESUR NEGATIVE 02/04/2021 0010   PROTEINUR NEGATIVE 02/04/2021 0010   NITRITE NEGATIVE 02/04/2021 0010   LEUKOCYTESUR NEGATIVE 02/04/2021 0010   Sepsis Labs: Invalid input(s): PROCALCITONIN, LACTICIDVEN  Microbiology: Recent Results (from the past 240 hours)  MRSA Next Gen by PCR, Nasal     Status: None   Collection Time: 04/14/24  3:13 PM   Specimen: Nasal Mucosa; Nasal Swab  Result Value Ref Range Status   MRSA by PCR Next Gen NOT DETECTED NOT DETECTED Final    Comment: (NOTE) The GeneXpert MRSA Assay (FDA approved for NASAL specimens only), is one component of a comprehensive MRSA colonization surveillance program. It is not intended to diagnose MRSA infection nor to guide or monitor treatment for MRSA infections. Test performance is not FDA approved in patients less than 39 years old. Performed at [facility], [address] [place], [place] [address]   Culture, blood (Routine X 2) w Reflex to ID Panel     Status: None (Preliminary result)   Collection Time: 04/14/24  8:33 PM   Specimen: BLOOD LEFT HAND  Result Value Ref Range Status   Specimen Description BLOOD LEFT HAND  Final   Special Requests   Final    BOTTLES DRAWN AEROBIC AND ANAEROBIC Blood Culture adequate volume   Culture   Final    NO GROWTH 4 DAYS Performed at [facility], [address], [place], [place] [address]    Report Status PENDING  Incomplete  Culture, blood (Routine X 2) w Reflex to ID Panel     Status: None (Preliminary result)   Collection Time: 04/14/24  8:35 PM   Specimen: BLOOD RIGHT HAND  Result Value Ref Range Status   Specimen Description BLOOD RIGHT  HAND  Final   Special Requests   Final    BOTTLES DRAWN AEROBIC AND ANAEROBIC Blood Culture adequate volume   Culture   Final    NO GROWTH 4 DAYS Performed at [facility], [address] [place], [place] [address]    Report Status PENDING  Incomplete  Respiratory (~20 pathogens) panel by PCR     Status: None   Collection Time: 04/15/24 10:00 AM   Specimen: Nasopharyngeal Swab; Respiratory  Result Value Ref Range Status   Adenovirus  NOT DETECTED NOT DETECTED Final   Coronavirus 229E NOT DETECTED NOT DETECTED Final    Comment: (NOTE) The Coronavirus on the Respiratory Panel, DOES NOT test for the novel  Coronavirus (2019 nCoV)    Coronavirus HKU1 NOT DETECTED NOT DETECTED Final   Coronavirus NL63 NOT DETECTED NOT DETECTED Final   Coronavirus OC43 NOT DETECTED NOT DETECTED Final   Metapneumovirus NOT DETECTED NOT DETECTED Final   Rhinovirus / Enterovirus NOT DETECTED NOT DETECTED Final   Influenza A NOT DETECTED NOT DETECTED Final   Influenza B NOT DETECTED NOT DETECTED Final   Parainfluenza Virus 1 NOT DETECTED NOT DETECTED Final   Parainfluenza Virus 2 NOT DETECTED NOT DETECTED Final   Parainfluenza Virus 3 NOT DETECTED NOT DETECTED Final   Parainfluenza Virus 4 NOT DETECTED NOT DETECTED Final   Respiratory Syncytial Virus NOT DETECTED NOT DETECTED Final   Bordetella pertussis NOT DETECTED NOT DETECTED Final   Bordetella Parapertussis NOT DETECTED NOT DETECTED Final   Chlamydophila pneumoniae NOT DETECTED NOT DETECTED Final   Mycoplasma pneumoniae NOT DETECTED NOT DETECTED Final    Comment: Performed at [facility], [address] [place], [place] [address]    Radiology Studies: No results found.    Darius Rivers T. Darius Rivers [facility]  If 7PM-7AM, please contact night-coverage [email or web address] 04/18/2024, 11:09 AM

## 2024-04-18 NOTE — Progress Notes (Signed)
 Advanced Heart Failure Rounding Note  Cardiologist: Jann Melody, MD   Chief Complaint: Acute on chronic systolic CHF  Subjective:    No dyspnea, orthopnea or PND. Notes poor appetite since admission which he attributes to potassium supplement.    Objective:   Weight Range: 66.4 kg Body mass index is 17.59 kg/m.   Vital Signs:   Temp:  [97.6 F (36.4 C)-98.7 F (37.1 C)] 98.4 F (36.9 C) (06/16 0744) Pulse Rate:  [59-73] 73 (06/16 0324) Resp:  [17-20] 20 (06/16 0744) BP: (79-117)/(60-84) 117/84 (06/16 0744) SpO2:  [94 %-100 %] 100 % (06/16 0744) Weight:  [66.4 kg] 66.4 kg (06/16 0334) Last BM Date : 04/17/24  Weight change: Filed Weights   04/16/24 1235 04/17/24 0300 04/18/24 0334  Weight: 67.2 kg 58.2 kg 66.4 kg    Intake/Output:   Intake/Output Summary (Last 24 hours) at 04/18/2024 0943 Last data filed at 04/18/2024 0700 Gross per 24 hour  Intake 240 ml  Output 575 ml  Net -335 ml      Physical Exam    General:  Chronically ill appearing. Looks older than stated age. Neck: JVP ~ 6 cm Cor: Regular rate & rhythm. + diastolic murmur best heard at RUSB Lungs: Clear Abdomen: Soft, nontender, nondistended.  Extremities: No edema Neuro: Alert & orientedx3. Affect flat   Telemetry   Sinus rhythm 60s-70s  Labs    CBC Recent Labs    04/16/24 0553 04/17/24 0231  WBC 9.1 8.6  HGB 12.2* 13.4  HCT 36.6* 40.9  MCV 89.5 91.3  PLT 366 387   Basic Metabolic Panel Recent Labs    16/10/96 0231 04/18/24 0246  NA 134* 134*  K 4.4 5.2*  CL 100 104  CO2 21* 21*  GLUCOSE 125* 109*  BUN 37* 44*  CREATININE 1.86* 1.63*  CALCIUM  9.1 9.0  MG 2.2 2.3  PHOS 4.2  --    Liver Function Tests Recent Labs    04/17/24 0231 04/18/24 0246  AST  --  12*  ALT  --  9  ALKPHOS  --  42  BILITOT  --  0.7  PROT  --  6.2*  ALBUMIN 3.5 3.1*   No results for input(s): LIPASE, AMYLASE in the last 72 hours. Cardiac Enzymes No results for  input(s): CKTOTAL, CKMB, CKMBINDEX, TROPONINI in the last 72 hours.  BNP: BNP (last 3 results) Recent Labs    12/14/23 0945 12/23/23 0933 04/17/24 0231  BNP >4,500.0* 233.1* 616.6*    ProBNP (last 3 results) Recent Labs    04/14/24 1018  PROBNP 5,937.0*     D-Dimer No results for input(s): DDIMER in the last 72 hours. Hemoglobin A1C No results for input(s): HGBA1C in the last 72 hours. Fasting Lipid Panel No results for input(s): CHOL, HDL, LDLCALC, TRIG, CHOLHDL, LDLDIRECT in the last 72 hours. Thyroid  Function Tests No results for input(s): TSH, T4TOTAL, T3FREE, THYROIDAB in the last 72 hours.  Invalid input(s): FREET3  Other results:   Imaging    No results found.   Medications:     Scheduled Medications:  aspirin  EC  81 mg Oral Daily   empagliflozin   10 mg Oral Daily   ezetimibe   10 mg Oral Daily   feeding supplement  237 mL Oral BID BM   heparin   5,000 Units Subcutaneous Q8H   multivitamin with minerals  1 tablet Oral Daily   rosuvastatin   40 mg Oral q1800   sodium chloride  flush  3 mL Intravenous  Q12H   sodium zirconium cyclosilicate  10 g Oral Once    Infusions:  sodium chloride  10 mL/hr at 04/18/24 0530    PRN Medications: acetaminophen  **OR** acetaminophen , hydrALAZINE , [COMPLETED] polyethylene glycol **FOLLOWED BY** polyethylene glycol, [COMPLETED] senna-docusate **FOLLOWED BY** senna-docusate    Patient Profile   63 y.o. male with history of chronic systolic CHF/NICM, LBBB, ascending aortic aneurysm, CAD, CKD, HTN, HLD. Admitted with hypertensive crisis and acute on chronic CHF.  Assessment/Plan    1. Acute on chronic systolic HF due to NICM - onset 2017 EF 20%. Cath insig CAD - Echo 2020 EF 40-45% - Echo 4/22 EF 25-30%% -> cath LAD 15% otherwise normal cors.  - Admitted 2/25 for ADHF in setting of medication non-compliance. cMRI 2/25: Severely dilated LV (7.9 cm) EF 27%  + LGE in basilar  inferoseptal wall No LVH. RVEF 42% AscAo aneurysm 4.6cm  - Echo 04/15/24 EF < 20 Markedly dilated RV normal  Moderate AI - Etiology unclear. Suspect HTN as priamry factor but also has LBBB ( now; was 126 ms in 2/25), AI and PVCs - Volume status okay. Not currently on diuretic. Can add if needed post cath.  - He's been hypotensive with restarting home GDMT. Reports adherence but suspect compliance is an issue.  - Continue Jardiance  10 daily - Coreg , entresto  and spiro have been held d/t hypotension and hyperkalemia (also received K supp) - Start entresto  24/26 mg BID - has refused ICD in past - He has severe NICM with almost an 8cm LV. Likely end-stage but reports NYHA I symptoms.  - Going for RHC today. Would also consider CPX if RHC unrevealing.  - Think he may be close to advanced therapies but with cachexia and CKD he would be marginal candidate.   2. HTN crisis with HF - suspect med compliance is an issue   3. CKD 3b - b/l Scr 1.6-1.7 - Scr stable 1.63 today   4. Aortic valve insufficiency - moderate by echo    5. Ascending thoracic aortic aneurysm - Echocardiogram 4/22:  44 mm - CT 7/22: 44 mm >> rpt 1 year  - cMRI 2/25 46mm   6. PVCs on ECG - does not appear frequent on tele today   7. NSVT  - Keep K> 4.0 Mg > 2,0     Length of Stay: 4  Bertie Simien N, PA-C  04/18/2024, 9:43 AM  Advanced Heart Failure Team Pager [phone] (M-F; 7a - 5p)  Please contact [facility] for night-coverage after hours (5p -7a ) and weekends on [email or web address]

## 2024-04-18 NOTE — Progress Notes (Signed)
 Mobility Specialist Progress Note:    04/18/24 1014  Mobility  Activity Ambulated with assistance in hallway  Level of Assistance Contact guard assist, steadying assist  Assistive Device None  Distance Ambulated (ft) 400 ft  Activity Response Tolerated well  Mobility Referral Yes  Mobility visit 1 Mobility  Mobility Specialist Start Time (ACUTE ONLY) [id number]  Mobility Specialist Stop Time (ACUTE ONLY) 0951  Mobility Specialist Time Calculation (min) (ACUTE ONLY) 12 min   Received pt in bed, agreeable to mobility. Required MinG assistance for safety d/t unsteadiness this session. VSS throughout. C/o groin pain, provided a heat pack. Pt returned back to bed. All needs met. Personal belongings and call light within reach.   Doak Free Mobility Specialist  Please contact via [employer] or  Rehab Office [phone]

## 2024-04-18 NOTE — Progress Notes (Signed)
 Pt complaining of intermittent sharp left groin pain, warm compress applied, attempted to notify Teofilo Fellers, MD.  Sonjia Durie, RN

## 2024-04-18 NOTE — Interval H&P Note (Signed)
 History and Physical Interval Note:  04/18/2024 10:48 AM  Darius Rivers  has presented today for surgery, with the diagnosis of heart failure.  The various methods of treatment have been discussed with the patient and family. After consideration of risks, benefits and other options for treatment, the patient has consented to  Procedure(s): RIGHT HEART CATH (N/A) as a surgical intervention.  The patient's history has been reviewed, patient examined, no change in status, stable for surgery.  I have reviewed the patient's chart and labs.  Questions were answered to the patient's satisfaction.     [name]

## 2024-04-18 NOTE — Progress Notes (Signed)
 Heart Failure Navigator Progress Note  Assessed for Heart & Vascular TOC clinic readiness.  Patient does not meet criteria due to Advanced Heart Failure Team was consulted. HF TOC appointment for 04/22/2024 was cancelled. .   Navigator will sign off at this time.   Randie Bustle, BSN, Scientist, clinical (histocompatibility and immunogenetics) Only

## 2024-04-18 NOTE — TOC Initial Note (Signed)
 Transition of Care [facility]) - Initial/Assessment Note    Patient Details  Name: Darius Rivers MRN: [id number] Date of Birth: Feb 02, 1961  [facility] CM/SW Contact:    Ernst Heap Phone Number: [phone] 04/18/2024, 11:34 AM  Clinical Narrative:    11:15 AM- HF CSW attempted to meet with patient at bedside. Patient was not in the room. HF CSW/NCM will follow up with patient at a more appropriate time.   TOC will continue following.                      Patient Goals and CMS Choice            Expected Discharge Plan and Services                                              Prior Living Arrangements/Services                       Activities of Daily Living   ADL Screening (condition at time of admission) Independently performs ADLs?: Yes (appropriate for developmental age) Is the patient deaf or have difficulty hearing?: No Does the patient have difficulty seeing, even when wearing glasses/contacts?: No Does the patient have difficulty concentrating, remembering, or making decisions?: No  Permission Sought/Granted                  Emotional Assessment              Admission diagnosis:  Shortness of breath [R06.02] Respiratory distress [R06.03] Acute on chronic congestive heart failure, unspecified heart failure type (HCC) [I50.9] Patient Active Problem List   Diagnosis Date Noted   Protein-calorie malnutrition, severe 04/16/2024   Respiratory distress 04/14/2024   Shortness of breath 04/14/2024   Acute on chronic HFrEF (heart failure with reduced ejection fraction) (HCC) 04/14/2024   Hypertensive urgency 04/14/2024   NICM (nonischemic cardiomyopathy) (HCC) 04/14/2024   Elevated troponin 04/14/2024   Heart failure (HCC) 12/14/2023   CHF (congestive heart failure) (HCC) 12/14/2023   CAD (coronary artery disease) 12/03/2023   Moderate aortic insufficiency 11/25/2022   Thoracic ascending aortic aneurysm  05/24/2021   Lung nodule 05/24/2021   Essential hypertension 02/04/2021   AAA (abdominal aortic aneurysm) (HCC) 02/04/2021   H/O prostate cancer 02/04/2021   Prolonged QT interval 02/03/2021   NSVT (nonsustained ventricular tachycardia) (HCC) 11/14/2018   AKI (acute kidney injury) (HCC) 11/11/2018   Troponin I above reference range 11/11/2018   Hypotension 11/11/2018   Dizziness 11/11/2018   Hyperlipidemia 11/11/2018   HFrEF (heart failure with reduced ejection fraction) (HCC) 11/11/2018   CKD (chronic kidney disease), stage III (HCC) 11/11/2018   Hyponatremia 11/11/2018   Hypokalemia 11/11/2018   Lactic acid increased 11/11/2018   Bilateral nephrolithiasis 11/11/2018   PCP:  Alejo Amsler, PA Pharmacy:   [id number] - [place] - [address] [place] [address] Phone: [phone] Fax: [phone]  [facility] [address] [place] [address] Phone: [phone] Fax: [phone]     Social Drivers of Health (SDOH) Social History: SDOH Screenings   Food Insecurity: No Food Insecurity (04/14/2024)  Housing: Unknown (04/15/2024)  Transportation Needs: No Transportation Needs (04/14/2024)  Utilities: Not At Risk (04/14/2024)  Alcohol Screen: Low Risk  (  04/15/2024)  Financial Resource Strain: Low Risk  (04/15/2024)  Social Connections: Socially Isolated (04/14/2024)  Tobacco Use: Medium Risk (04/14/2024)   SDOH Interventions: Housing Interventions: Intervention Not Indicated Alcohol Usage Interventions: Intervention Not Indicated (Score <7) Financial Strain Interventions: Intervention Not Indicated   Readmission Risk Interventions     No data to display

## 2024-04-19 DIAGNOSIS — R7989 Other specified abnormal findings of blood chemistry: Secondary | ICD-10-CM

## 2024-04-19 DIAGNOSIS — I5023 Acute on chronic systolic (congestive) heart failure: Secondary | ICD-10-CM

## 2024-04-19 DIAGNOSIS — R0602 Shortness of breath: Secondary | ICD-10-CM

## 2024-04-19 DIAGNOSIS — I160 Hypertensive urgency: Secondary | ICD-10-CM

## 2024-04-19 DIAGNOSIS — N1831 Chronic kidney disease, stage 3a: Secondary | ICD-10-CM

## 2024-04-19 DIAGNOSIS — E43 Unspecified severe protein-calorie malnutrition: Secondary | ICD-10-CM

## 2024-04-19 DIAGNOSIS — I428 Other cardiomyopathies: Secondary | ICD-10-CM

## 2024-04-19 DIAGNOSIS — I5020 Unspecified systolic (congestive) heart failure: Secondary | ICD-10-CM

## 2024-04-19 LAB — CBC
HCT: 34.5 % — ABNORMAL LOW (ref 39.0–52.0)
Hemoglobin: 11.2 g/dL — ABNORMAL LOW (ref 13.0–17.0)
MCH: 29.9 pg (ref 26.0–34.0)
MCHC: 32.5 g/dL (ref 30.0–36.0)
MCV: 92 fL (ref 80.0–100.0)
Platelets: 328 10*3/uL (ref 150–400)
RBC: 3.75 MIL/uL — ABNORMAL LOW (ref 4.22–5.81)
RDW: 17.3 % — ABNORMAL HIGH (ref 11.5–15.5)
WBC: 8.2 10*3/uL (ref 4.0–10.5)
nRBC: 0 % (ref 0.0–0.2)

## 2024-04-19 LAB — RENAL FUNCTION PANEL
Albumin: 2.9 g/dL — ABNORMAL LOW (ref 3.5–5.0)
Anion gap: 10 (ref 5–15)
BUN: 40 mg/dL — ABNORMAL HIGH (ref 8–23)
CO2: 20 mmol/L — ABNORMAL LOW (ref 22–32)
Calcium: 8.8 mg/dL — ABNORMAL LOW (ref 8.9–10.3)
Chloride: 104 mmol/L (ref 98–111)
Creatinine, Ser: 1.4 mg/dL — ABNORMAL HIGH (ref 0.61–1.24)
GFR, Estimated: 57 mL/min — ABNORMAL LOW (ref >60)
Glucose, Bld: 114 mg/dL — ABNORMAL HIGH (ref 70–99)
Phosphorus: 3.2 mg/dL (ref 2.5–4.6)
Potassium: 4.6 mmol/L (ref 3.5–5.1)
Sodium: 134 mmol/L — ABNORMAL LOW (ref 135–145)

## 2024-04-19 LAB — CULTURE, BLOOD (ROUTINE X 2)
Culture: NO GROWTH
Culture: NO GROWTH
Special Requests: ADEQUATE
Special Requests: ADEQUATE

## 2024-04-19 LAB — MAGNESIUM: Magnesium: 2.4 mg/dL (ref 1.7–2.4)

## 2024-04-19 LAB — GLUCOSE, CAPILLARY: Glucose-Capillary: 108 mg/dL — ABNORMAL HIGH (ref 70–99)

## 2024-04-19 MED ORDER — ENSURE PLUS HIGH PROTEIN PO LIQD: 237.0000 mL | Freq: Two times a day (BID) | ORAL | Status: AC

## 2024-04-19 MED ORDER — DIGOXIN 125 MCG PO TABS: 0.1250 mg | ORAL_TABLET | Freq: Every day | ORAL | 0 refills | Status: DC

## 2024-04-19 MED ORDER — ADULT MULTIVITAMIN W/MINERALS CH: 1.0000 | ORAL_TABLET | Freq: Every day | ORAL | Status: DC

## 2024-04-19 MED ORDER — SACUBITRIL-VALSARTAN 24-26 MG PO TABS: 1.0000 | ORAL_TABLET | Freq: Two times a day (BID) | ORAL | 0 refills | Status: DC

## 2024-04-19 MED ORDER — DIGOXIN 125 MCG PO TABS
0.1250 mg | ORAL_TABLET | Freq: Every day | ORAL | Status: DC
  Administered 2024-04-19: 0.125 mg via ORAL
  Filled 2024-04-19: qty 1

## 2024-04-19 MED ORDER — SENNOSIDES-DOCUSATE SODIUM 8.6-50 MG PO TABS: 1.0000 | ORAL_TABLET | Freq: Two times a day (BID) | ORAL | Status: AC | PRN

## 2024-04-19 NOTE — Discharge Summary (Signed)
 Physician Discharge Summary  Kinney Sackmann ZOX:[id number] DOB: [birth year] DOA: 04/14/2024  PCP: No primary care provider on file.  Admit date: 04/14/2024 Discharge date: 04/19/24  Admitted From: Home Disposition: Home Recommendations for Outpatient Follow-up:  Outpatient follow-up with PCP and cardiology as below Check fluid status, BP, CMP and CBC at follow-up Please follow up on the following pending results: None  Home Health: No need identified Equipment/Devices: No need identified  Discharge Condition: Stable CODE STATUS: Full code  Follow-up Information      [facility] Follow up on 05/05/2024.   Specialty: Cardiology Why: Advanced [facility] 10:30 AM [address], Free Valet Parking Please bring all medications to appointment. Contact information: [address] [place]   [address] [phone]        Darrelyn Ely [place], [place] Follow up.   Specialty: Family Medicine Contact information: [address] [place] [address] [phone]                 Hospital course 63 y.o. male with past medical history  of  HFrEF, aneurysm of ascending aorta without rupture, aortic insufficiency, coronary artery disease/NSTEMI in 2020 January,, hypertension, hyperlipidemia, prostate cancer, CKD, presents with concern for shortness of breath ongoing for the past day.  The emergency room patient was found to have proBNP 5937 as well as chest x-ray showing pulmonary vascular congestion concerning for acute on chronic heart failure exacerbation.  Diuresed with IV Lasix  but AKI and hypotension.  Diuretics held.   GDMT decreased.  Cardiology consulted advanced heart failure team.  Patient underwent right heart catheterization that showed normal pulmonary arterial pressure but decreased cardiac output.  On the day of discharge, patient remained stable without cardiopulmonary symptoms.  Blood  pressure and kidney function improved.  He is cleared for discharge on low-dose aspirin , digoxin, Jardiance , Zetia , Crestor  and low-dose Entresto  by advanced heart failure team.  See individual problem list below for more.   Problems addressed during this hospitalization Acute on chronic HFrEF: TTE with LVEF of <20%, GH, G1DD, severe LAE and moderate AR.  proBNP was elevated to 6000.  CXR consistent with HF.  Diuresed by IV Lasix .  Net -2.6 L.  Diuretics discontinued and GDMT adjusted due to AKI and hypotension.  RHC on 6/16 with normal PAP but decreased cardiac output.  - Cleared for discharge on low-dose aspirin , low-dose Entresto , digoxin, Jardiance , Zetia  and Crestor .  - Cardiology to arrange outpatient follow-up - Reassess BP, fluid status, renal functions and electrolytes at follow-up   Acute hypoxic respiratory failure due to CHF exacerbation.  Resolved. - Manage CHF as above.   Hypertensive urgency: Normotensive. - Cardiac meds as above   AKI on CKD-3A: Baseline Cr ~1.5.  Likely due to diuretics, Entresto  and hypotension.  AKI resolved. -Cardiac meds adjusted-Entresto  decreased, hydralazine  and Coreg  discontinued. -Recheck renal function in 1 to 2 weeks.   Elevated troponin/demand ischemia: Likely due to the above.  TTE as above. History of CAD/STEMI in 2020 - Cardiac meds as above - Continue Crestor , Zetia , low-dose aspirin    Moderate aortic regurgitation - Per cardiology.   Hyperlipidemia -continue statin and Zetia  as above   Ascending aortic aneurysm without rupture -continue outpatient follow-up   Constipation: Resolved. - Bowel regimen   Hypokalemia/hyperkalemia: Resolved.  Hyponatremia: Mild   Left groin pain: Likely from hernia.  No signs of incarceration.  Having bowel movements.  Resolved.   Severe malnutrition Nutrition Problem: Severe Malnutrition Etiology:  chronic illness (CKD, HFrEF) Signs/Symptoms: severe muscle depletion, severe fat  depletion Interventions: Ensure Enlive (each supplement provides 350kcal and 20 grams of protein), MVI, Magic cup, Education     Time spent 35 minutes  Vital signs Vitals:   04/18/24 2312 04/19/24 0454 04/19/24 0747 04/19/24 1047  BP: 104/78 121/83 106/88 91/73  Pulse: 73 75    Temp: 98.2 F (36.8 C) 98.1 F (36.7 C) 98.2 F (36.8 C) 98.1 F (36.7 C)  Resp: 18 18 19 20   Height:      Weight:  66.9 kg    SpO2: 97% 97% 97% 97%  TempSrc: Oral Oral Oral Oral  BMI (Calculated):  17.71       Discharge exam  GENERAL: No apparent distress.  Nontoxic. HEENT: MMM.  Vision and hearing grossly intact.  NECK: Supple.  No apparent JVD.  RESP:  No IWOB.  Fair aeration bilaterally. CVS:  RRR. Heart sounds normal.  ABD/GI/GU: BS+. Abd soft, NTND.  MSK/EXT:  Moves extremities. No apparent deformity. No edema.  SKIN: no apparent skin lesion or wound NEURO: Awake and alert. Oriented appropriately.  No apparent focal neuro deficit. PSYCH: Calm. Normal affect.   Discharge Instructions Discharge Instructions     Diet - low sodium heart healthy   Complete by: As directed    Discharge instructions   Complete by: As directed    It has been a pleasure taking care of you!  You were hospitalized due to heart failure for which you have been treated with medication.  Your symptoms improved.  We have made changes to your medications per recommendation by heart failure cardiologist.  Please review your new medication list and the directions on your medications before you take them.  Follow-up with your primary care doctor in 1 to 2 weeks or sooner if needed.  Follow-up with cardiologist per their recommendation.   Take care,   Increase activity slowly   Complete by: As directed    No wound care   Complete by: As directed       Allergies as of 04/19/2024   No Known Allergies      Medication List     STOP taking these medications    carvedilol  25 MG tablet Commonly known as: COREG     Entresto  97-103 MG Generic drug: sacubitril -valsartan  Replaced by: sacubitril -valsartan  24-26 MG   furosemide  40 MG tablet Commonly known as: Lasix    hydrALAZINE  100 MG tablet Commonly known as: APRESOLINE    isosorbide  mononitrate 30 MG 24 hr tablet Commonly known as: IMDUR    spironolactone  25 MG tablet Commonly known as: ALDACTONE        TAKE these medications    aspirin  EC 81 MG tablet Take 81 mg by mouth daily.   digoxin 0.125 MG tablet Commonly known as: LANOXIN Take 1 tablet (0.125 mg total) by mouth daily.   empagliflozin  10 MG Tabs tablet Commonly known as: Jardiance  Take 1 tablet (10 mg total) by mouth daily before breakfast.   ezetimibe  10 MG tablet Commonly known as: ZETIA  Take 1 tablet (10 mg total) by mouth daily.   feeding supplement Liqd Take 237 mLs by mouth 2 (two) times daily between meals.   multivitamin with minerals Tabs tablet Take 1 tablet by mouth daily. Start taking on: April 20, 2024   rosuvastatin  40 MG tablet Commonly known as: CRESTOR  Take 1 tablet (40 mg total) by mouth daily at 6 PM.   sacubitril -valsartan  24-26 MG Commonly known as: ENTRESTO  Take 1 tablet by mouth 2 (  two) times daily. Replaces: Entresto  97-103 MG   senna-docusate 8.6-50 MG tablet Commonly known as: Senokot-S Take 1 tablet by mouth 2 (two) times daily as needed for moderate constipation.        Consultations: Cardiology Advanced heart failure team  Procedures/Studies: 6/16-right heart catheterization showed normal pulmonary arterial pressure and decreased cardiac output   CARDIAC CATHETERIZATION Result Date: 04/18/2024 HEMODYNAMICS: RA:       1 mmHg (mean) RV:       18/1, 1 mmHg PA:       18/7 mmHg (10 mean) PCWP: 7 mmHg (mean)    Estimated Fick CO/CI   3.85L/min, 1.97L/min/m2 Thermodilution CO/CI   3.53L/min, 1.81L/min/m2    TPG  3  mmHg     PVR  0.3 Wood Units PAPi  11 SVR: 2200 dynes/sec/cm-5  IMPRESSION: Low filling pressures. Cathter tubing  flushed and pressure transducer exchanged without significant change in filling pressures Normal pulmonary artery pressures Severely reduced cardiac output by thermodilution RECOMMENDATIONS: Low filling pressures, give 500mL IVF. Continue to increase GDMT given elevated SVR and reduced cardiac index.   NM Pulmonary Perfusion Result Date: 04/15/2024 CLINICAL DATA:  High probability of pulmonary embolism. Short of breath. EXAM: NUCLEAR MEDICINE PERFUSION LUNG SCAN TECHNIQUE: Perfusion images were obtained in multiple projections after intravenous injection of radiopharmaceutical. RADIOPHARMACEUTICALS:  4.4 mCi Tc-54m MAA COMPARISON:  Chest radiograph 04/14/2024 FINDINGS: No wedge-shaped peripheral perfusion defect within LEFT or RIGHT lung to suggest acute pulmonary embolism. Normal perfusion pattern. IMPRESSION: No evidence acute pulmonary embolism. Electronically Signed   By: Deboraha Fallow M.D.   On: 04/15/2024 15:24   ECHOCARDIOGRAM COMPLETE Result Date: 04/15/2024    ECHOCARDIOGRAM REPORT   Patient Name:   Darius Rivers Date of Exam: 04/15/2024 Medical Rec #:  [id number]        Height:       76.5 in Accession #:    [id number]       Weight:       152.1 lb Date of Birth:  [birth year]         BSA:          1.982 m Patient Age:    63 years         BP:           118/73 mmHg Patient Gender: M                HR:           71 bpm. Exam Location:  Inpatient Procedure: 2D Echo, Cardiac Doppler, Color Doppler and Intracardiac            Opacification Agent (Both Spectral and Color Flow Doppler were            utilized during procedure). Indications:    CHF  History:        Patient has prior history of Echocardiogram examinations, most                 recent 02/04/2021. CHF, CAD; Risk Factors:Hypertension.  Sonographer:    Astrid Blamer Referring Phys: [phone] EKTA V PATEL IMPRESSIONS  1. Left ventricular ejection fraction, by estimation, is <20%. The left ventricle has severely decreased function. The left ventricle  demonstrates global hypokinesis. The left ventricular internal cavity size was severely dilated. There is moderate left  ventricular hypertrophy. Left ventricular diastolic parameters are consistent with Grade I diastolic dysfunction (impaired relaxation).  2. Right ventricular systolic function is normal. The right ventricular size is normal. Tricuspid regurgitation signal  is inadequate for assessing PA pressure.  3. Left atrial size was severely dilated.  4. The mitral valve is normal in structure. Mild mitral valve regurgitation. No evidence of mitral stenosis.  5. The aortic valve was not well visualized. Aortic valve regurgitation is moderate. Aortic valve sclerosis/calcification is present, without any evidence of aortic stenosis.  6. The inferior vena cava is normal in size with greater than 50% respiratory variability, suggesting right atrial pressure of 3 mmHg. FINDINGS  Left Ventricle: Left ventricular ejection fraction, by estimation, is <20%. The left ventricle has severely decreased function. The left ventricle demonstrates global hypokinesis. The left ventricular internal cavity size was severely dilated. There is moderate left ventricular hypertrophy. Left ventricular diastolic parameters are consistent with Grade I diastolic dysfunction (impaired relaxation). Right Ventricle: The right ventricular size is normal. No increase in right ventricular wall thickness. Right ventricular systolic function is normal. Tricuspid regurgitation signal is inadequate for assessing PA pressure. Left Atrium: Left atrial size was severely dilated. Right Atrium: Right atrial size was normal in size. Pericardium: There is no evidence of pericardial effusion. Mitral Valve: The mitral valve is normal in structure. Mild mitral valve regurgitation. No evidence of mitral valve stenosis. Tricuspid Valve: The tricuspid valve is normal in structure. Tricuspid valve regurgitation is trivial. Aortic Valve: The aortic valve was not  well visualized. Aortic valve regurgitation is moderate. Aortic valve sclerosis/calcification is present, without any evidence of aortic stenosis. Aortic valve mean gradient measures 4.0 mmHg. Aortic valve peak gradient measures 7.2 mmHg. Aortic valve area, by VTI measures 2.40 cm. Pulmonic Valve: The pulmonic valve was not well visualized. Pulmonic valve regurgitation is not visualized. Aorta: The aortic root is normal in size and structure. Venous: The inferior vena cava is normal in size with greater than 50% respiratory variability, suggesting right atrial pressure of 3 mmHg. IAS/Shunts: The atrial septum is grossly normal.  LEFT VENTRICLE PLAX 2D LVIDd:         7.20 cm   Diastology LVIDs:         6.60 cm   LV e' medial:    6.20 cm/s LV PW:         1.40 cm   LV E/e' medial:  7.1 LV IVS:        1.30 cm   LV e' lateral:   10.10 cm/s LVOT diam:     2.00 cm   LV E/e' lateral: 4.4 LV SV:         52 LV SV Index:   26 LVOT Area:     3.14 cm  RIGHT VENTRICLE RV S prime:     15.20 cm/s TAPSE (M-mode): 2.6 cm LEFT ATRIUM              Index        RIGHT ATRIUM           Index LA Vol (A2C):   180.0 ml 90.83 ml/m  RA Area:     13.60 cm LA Vol (A4C):   60.5 ml  30.53 ml/m  RA Volume:   32.90 ml  16.60 ml/m LA Biplane Vol: 113.0 ml 57.02 ml/m  AORTIC VALVE AV Area (Vmax):    2.17 cm AV Area (Vmean):   2.25 cm AV Area (VTI):     2.40 cm AV Vmax:           134.00 cm/s AV Vmean:          88.000 cm/s AV VTI:  0.216 m AV Peak Grad:      7.2 mmHg AV Mean Grad:      4.0 mmHg LVOT Vmax:         92.60 cm/s LVOT Vmean:        62.900 cm/s LVOT VTI:          0.165 m LVOT/AV VTI ratio: 0.76  AORTA Ao Root diam: 3.70 cm MITRAL VALVE MV Area (PHT): 4.33 cm    SHUNTS MV Decel Time: 175 msec    Systemic VTI:  0.16 m MV E velocity: 44.00 cm/s  Systemic Diam: 2.00 cm MV A velocity: 92.20 cm/s MV E/A ratio:  0.48 Carson Clara MD Electronically signed by Carson Clara MD Signature Date/Time: 04/15/2024/3:00:09 PM     Final    CT CHEST WO CONTRAST Result Date: 04/14/2024 CLINICAL DATA:  Respiratory distress, failure, shortness of breath EXAM: CT CHEST WITHOUT CONTRAST TECHNIQUE: Multidetector CT imaging of the chest was performed following the standard protocol without IV contrast. RADIATION DOSE REDUCTION: This exam was performed according to the departmental dose-optimization program which includes automated exposure control, adjustment of the mA and/or kV according to patient size and/or use of iterative reconstruction technique. COMPARISON:  12/11/2022 FINDINGS: Cardiovascular: Heart is borderline in size. Aorta is normal caliber. Moderate calcifications in the coronary arteries most pronounced in the left anterior descending and right coronary arteries. Moderate aortic atherosclerosis. Mediastinum/Nodes: No mediastinal, hilar, or axillary adenopathy. Trachea and esophagus are unremarkable. Thyroid  unremarkable. Lungs/Pleura: Paraseptal emphysema. Scattered ground-glass opacities bilaterally, left greater than right, new since prior study. No effusions. Upper Abdomen: No acute findings Musculoskeletal: Chest wall soft tissues are unremarkable. No acute bony abnormality. IMPRESSION: Scattered bilateral ground-glass airspace opacities, left greater than right. Favor infectious/inflammatory process. Atypical infection is possible. Early asymmetric edema could have this appearance. Coronary artery disease.  Borderline cardiomegaly. Aortic Atherosclerosis (ICD10-I70.0). Electronically Signed   By: Janeece Mechanic M.D.   On: 04/14/2024 21:30   DG Chest 2 View Result Date: 04/14/2024 CLINICAL DATA:  Shortness of breath since yesterday. EXAM: CHEST - 2 VIEW COMPARISON:  12/14/2023 FINDINGS: Stable enlarged cardiac silhouette. Tortuous and partially calcified thoracic aorta. Stable mild prominence of the interstitial markings with interval minimal patchy density in the perihilar and mid lung zones bilaterally. No pleural fluid  seen. Mild-to-moderate peribronchial thickening. Unremarkable bones. IMPRESSION: 1. Mild-to-moderate bronchitic changes. 2. Stable cardiomegaly and mild interstitial pulmonary edema with interval minimal bilateral perihilar and mid lung zone alveolar edema. Pneumonia is less likely but not excluded. Electronically Signed   By: Catherin Closs M.D.   On: 04/14/2024 10:46       The results of significant diagnostics from this hospitalization (including imaging, microbiology, ancillary and laboratory) are listed below for reference.     Microbiology: Recent Results (from the past 240 hours)  MRSA Next Gen by PCR, Nasal     Status: None   Collection Time: 04/14/24  3:13 PM   Specimen: Nasal Mucosa; Nasal Swab  Result Value Ref Range Status   MRSA by PCR Next Gen NOT DETECTED NOT DETECTED Final    Comment: (NOTE) The GeneXpert MRSA Assay (FDA approved for NASAL specimens only), is one component of a comprehensive MRSA colonization surveillance program. It is not intended to diagnose MRSA infection nor to guide or monitor treatment for MRSA infections. Test performance is not FDA approved in patients less than 51 years old. Performed at [facility], [address] [place], [place] [address]   Culture, blood (Routine  X 2) w Reflex to ID Panel     Status: None   Collection Time: 04/14/24  8:33 PM   Specimen: BLOOD LEFT HAND  Result Value Ref Range Status   Specimen Description BLOOD LEFT HAND  Final   Special Requests   Final    BOTTLES DRAWN AEROBIC AND ANAEROBIC Blood Culture adequate volume   Culture   Final    NO GROWTH 5 DAYS Performed at [facility], [address], [place], [place] [address]    Report Status 04/19/2024 FINAL  Final  Culture, blood (Routine X 2) w Reflex to ID Panel     Status: None   Collection Time: 04/14/24  8:35 PM   Specimen: BLOOD RIGHT HAND  Result Value Ref Range Status   Specimen Description BLOOD RIGHT HAND  Final   Special Requests    Final    BOTTLES DRAWN AEROBIC AND ANAEROBIC Blood Culture adequate volume   Culture   Final    NO GROWTH 5 DAYS Performed at [facility], [address] [place], [place] [address]    Report Status 04/19/2024 FINAL  Final  Respiratory (~20 pathogens) panel by PCR     Status: None   Collection Time: 04/15/24 10:00 AM   Specimen: Nasopharyngeal Swab; Respiratory  Result Value Ref Range Status   Adenovirus NOT DETECTED NOT DETECTED Final   Coronavirus 229E NOT DETECTED NOT DETECTED Final    Comment: (NOTE) The Coronavirus on the Respiratory Panel, DOES NOT test for the novel  Coronavirus (2019 nCoV)    Coronavirus HKU1 NOT DETECTED NOT DETECTED Final   Coronavirus NL63 NOT DETECTED NOT DETECTED Final   Coronavirus OC43 NOT DETECTED NOT DETECTED Final   Metapneumovirus NOT DETECTED NOT DETECTED Final   Rhinovirus / Enterovirus NOT DETECTED NOT DETECTED Final   Influenza A NOT DETECTED NOT DETECTED Final   Influenza B NOT DETECTED NOT DETECTED Final   Parainfluenza Virus 1 NOT DETECTED NOT DETECTED Final   Parainfluenza Virus 2 NOT DETECTED NOT DETECTED Final   Parainfluenza Virus 3 NOT DETECTED NOT DETECTED Final   Parainfluenza Virus 4 NOT DETECTED NOT DETECTED Final   Respiratory Syncytial Virus NOT DETECTED NOT DETECTED Final   Bordetella pertussis NOT DETECTED NOT DETECTED Final   Bordetella Parapertussis NOT DETECTED NOT DETECTED Final   Chlamydophila pneumoniae NOT DETECTED NOT DETECTED Final   Mycoplasma pneumoniae NOT DETECTED NOT DETECTED Final    Comment: Performed at [facility], [address] [place], [place] [address]     Labs:  CBC: Recent Labs  Lab 04/14/24 1736 04/15/24 0343 04/16/24 0553 04/17/24 0231 04/18/24 1112 04/19/24 0346  WBC 12.6* 9.5 9.1 8.6  --  8.2  HGB 9.7* 9.8* 12.2* 13.4 12.2*  12.2* 11.2*  HCT 29.4* 29.5* 36.6* 40.9 36.0*  36.0* 34.5*  MCV 91.6 89.9 89.5 91.3  --  92.0  PLT 320 306 366 387  --  328   BMP &GFR Recent  Labs  Lab 04/15/24 0343 04/16/24 0553 04/17/24 0231 04/18/24 0246 04/18/24 1112 04/19/24 0346  NA 137 136 134* 134* 137  136 134*  K 3.7 3.4* 4.4 5.2* 4.4  4.6 4.6  CL 105 101 100 104  --  104  CO2 20* 22 21* 21*  --  20*  GLUCOSE 145* 115* 125* 109*  --  114*  BUN 22 28* 37* 44*  --  40*  CREATININE 1.47* 1.53* 1.86* 1.63*  --  1.40*  CALCIUM   9.0 9.4 9.1 9.0  --  8.8*  MG 2.0 1.9 2.2 2.3  --  2.4  PHOS  --   --  4.2  --   --  3.2   Estimated Creatinine Clearance: 52.4 mL/min (A) (by C-G formula based on SCr of 1.4 mg/dL (H)). Liver & Pancreas: Recent Labs  Lab 04/14/24 1018 04/14/24 1736 04/15/24 0343 04/17/24 0231 04/18/24 0246 04/19/24 0346  AST 19 17 19   --  12*  --   ALT 9 10 10   --  9  --   ALKPHOS 76 54 53  --  42  --   BILITOT 0.9 1.1 1.1  --  0.7  --   PROT 8.3* 6.9 6.4*  --  6.2*  --   ALBUMIN 4.7 3.5 3.3* 3.5 3.1* 2.9*   No results for input(s): LIPASE, AMYLASE in the last 168 hours. No results for input(s): AMMONIA in the last 168 hours. Diabetic: No results for input(s): HGBA1C in the last 72 hours. Recent Labs  Lab 04/15/24 2117 04/16/24 2213 04/18/24 0526 04/18/24 2116 04/19/24 0618  GLUCAP 128* 117* 115* 108* 108*   Cardiac Enzymes: No results for input(s): CKTOTAL, CKMB, CKMBINDEX, TROPONINI in the last 168 hours. Recent Labs    04/14/24 1018  PROBNP 5,937.0*   Coagulation Profile: No results for input(s): INR, PROTIME in the last 168 hours. Thyroid  Function Tests: No results for input(s): TSH, T4TOTAL, FREET4, T3FREE, THYROIDAB in the last 72 hours. Lipid Profile: No results for input(s): CHOL, HDL, LDLCALC, TRIG, CHOLHDL, LDLDIRECT in the last 72 hours. Anemia Panel: No results for input(s): VITAMINB12, FOLATE, FERRITIN, TIBC, IRON, RETICCTPCT in the last 72 hours. Urine analysis:    Component Value Date/Time   COLORURINE YELLOW 02/04/2021 0010   APPEARANCEUR CLEAR 02/04/2021  0010   LABSPEC 1.025 02/04/2021 0010   PHURINE 6.0 02/04/2021 0010   GLUCOSEU NEGATIVE 02/04/2021 0010   HGBUR MODERATE (A) 02/04/2021 0010   BILIRUBINUR NEGATIVE 02/04/2021 0010   KETONESUR NEGATIVE 02/04/2021 0010   PROTEINUR NEGATIVE 02/04/2021 0010   NITRITE NEGATIVE 02/04/2021 0010   LEUKOCYTESUR NEGATIVE 02/04/2021 0010   Sepsis Labs: Invalid input(s): PROCALCITONIN, LACTICIDVEN   SIGNED:  Chloe Miyoshi T Kenlea Woodell, MD  [facility] 04/19/2024, 4:20 PM

## 2024-04-19 NOTE — Plan of Care (Signed)
 Problem: Education: Goal: Knowledge of General Education information will improve Description: Including pain rating scale, medication(s)/side effects and non-pharmacologic comfort measures Outcome: Adequate for Discharge   Problem: Health Behavior/Discharge Planning: Goal: Ability to manage health-related needs will improve Outcome: Adequate for Discharge   Problem: Clinical Measurements: Goal: Ability to maintain clinical measurements within normal limits will improve Outcome: Adequate for Discharge Goal: Will remain free from infection Outcome: Adequate for Discharge Goal: Diagnostic test results will improve Outcome: Adequate for Discharge Goal: Respiratory complications will improve Outcome: Adequate for Discharge Goal: Cardiovascular complication will be avoided Outcome: Adequate for Discharge   Problem: Activity: Goal: Risk for activity intolerance will decrease 04/19/2024 1312 by Mateo Solid, RN Outcome: Adequate for Discharge 04/19/2024 0813 by Mateo Solid, RN Outcome: Progressing   Problem: Nutrition: Goal: Adequate nutrition will be maintained 04/19/2024 1312 by Mateo Solid, RN Outcome: Adequate for Discharge 04/19/2024 0813 by Mateo Solid, RN Outcome: Progressing   Problem: Coping: Goal: Level of anxiety will decrease 04/19/2024 1312 by Mateo Solid, RN Outcome: Adequate for Discharge 04/19/2024 0813 by Mateo Solid, RN Outcome: Progressing   Problem: Elimination: Goal: Will not experience complications related to bowel motility 04/19/2024 1312 by Mateo Solid, RN Outcome: Adequate for Discharge 04/19/2024 0813 by Mateo Solid, RN Outcome: Progressing Goal: Will not experience complications related to urinary retention 04/19/2024 1312 by Mateo Solid, RN Outcome: Adequate for Discharge 04/19/2024 0813 by Mateo Solid, RN Outcome: Progressing   Problem: Pain Managment: Goal: General experience of comfort  will improve and/or be controlled 04/19/2024 1312 by Mateo Solid, RN Outcome: Adequate for Discharge 04/19/2024 0813 by Mateo Solid, RN Outcome: Progressing   Problem: Safety: Goal: Ability to remain free from injury will improve Outcome: Adequate for Discharge   Problem: Skin Integrity: Goal: Risk for impaired skin integrity will decrease Outcome: Adequate for Discharge   Problem: Education: Goal: Ability to demonstrate management of disease process will improve Outcome: Adequate for Discharge Goal: Ability to verbalize understanding of medication therapies will improve Outcome: Adequate for Discharge Goal: Individualized Educational Video(s) Outcome: Adequate for Discharge   Problem: Activity: Goal: Capacity to carry out activities will improve Outcome: Adequate for Discharge   Problem: Cardiac: Goal: Ability to achieve and maintain adequate cardiopulmonary perfusion will improve Outcome: Adequate for Discharge   Problem: Education: Goal: Ability to describe self-care measures that may prevent or decrease complications (Diabetes Survival Skills Education) will improve Outcome: Adequate for Discharge Goal: Individualized Educational Video(s) Outcome: Adequate for Discharge   Problem: Coping: Goal: Ability to adjust to condition or change in health will improve Outcome: Adequate for Discharge   Problem: Fluid Volume: Goal: Ability to maintain a balanced intake and output will improve Outcome: Adequate for Discharge   Problem: Health Behavior/Discharge Planning: Goal: Ability to identify and utilize available resources and services will improve Outcome: Adequate for Discharge Goal: Ability to manage health-related needs will improve Outcome: Adequate for Discharge   Problem: Metabolic: Goal: Ability to maintain appropriate glucose levels will improve Outcome: Adequate for Discharge   Problem: Nutritional: Goal: Maintenance of adequate nutrition will  improve Outcome: Adequate for Discharge Goal: Progress toward achieving an optimal weight will improve Outcome: Adequate for Discharge   Problem: Skin Integrity: Goal: Risk for impaired skin integrity will decrease Outcome: Adequate for Discharge   Problem: Tissue Perfusion: Goal: Adequacy of tissue perfusion will improve Outcome: Adequate for Discharge   Problem: Education: Goal: Ability to demonstrate management of disease process will  improve Outcome: Adequate for Discharge Goal: Ability to verbalize understanding of medication therapies will improve Outcome: Adequate for Discharge Goal: Individualized Educational Video(s) Outcome: Adequate for Discharge   Problem: Activity: Goal: Capacity to carry out activities will improve Outcome: Adequate for Discharge   Problem: Cardiac: Goal: Ability to achieve and maintain adequate cardiopulmonary perfusion will improve Outcome: Adequate for Discharge   Problem: Education: Goal: Ability to describe self-care measures that may prevent or decrease complications (Diabetes Survival Skills Education) will improve Outcome: Adequate for Discharge Goal: Individualized Educational Video(s) Outcome: Adequate for Discharge   Problem: Coping: Goal: Ability to adjust to condition or change in health will improve Outcome: Adequate for Discharge   Problem: Fluid Volume: Goal: Ability to maintain a balanced intake and output will improve Outcome: Adequate for Discharge   Problem: Health Behavior/Discharge Planning: Goal: Ability to identify and utilize available resources and services will improve Outcome: Adequate for Discharge Goal: Ability to manage health-related needs will improve Outcome: Adequate for Discharge   Problem: Metabolic: Goal: Ability to maintain appropriate glucose levels will improve Outcome: Adequate for Discharge   Problem: Nutritional: Goal: Maintenance of adequate nutrition will improve Outcome: Adequate for  Discharge Goal: Progress toward achieving an optimal weight will improve Outcome: Adequate for Discharge

## 2024-04-19 NOTE — Plan of Care (Signed)

## 2024-04-19 NOTE — Progress Notes (Signed)
 Advanced Heart Failure Rounding Note  Cardiologist: Jann Melody, MD   Chief Complaint: Acute on chronic systolic CHF  Subjective:    Feels good this morning. Denies CP/SOB. Has been ambulating.   Objective:   Weight Range: 66.9 kg Body mass index is 17.71 kg/m.   Vital Signs:   Temp:  [98 F (36.7 C)-98.2 F (36.8 C)] 98.1 F (36.7 C) (06/17 1047) Pulse Rate:  [71-80] 75 (06/17 0454) Resp:  [18-31] 20 (06/17 1047) BP: (91-128)/(67-88) 91/73 (06/17 1047) SpO2:  [93 %-100 %] 97 % (06/17 1047) Weight:  [66.9 kg] 66.9 kg (06/17 0454) Last BM Date : 04/17/24  Weight change: Filed Weights   04/17/24 0300 04/18/24 0334 04/19/24 0454  Weight: 58.2 kg 66.4 kg 66.9 kg    Intake/Output:   Intake/Output Summary (Last 24 hours) at 04/19/2024 1057 Last data filed at 04/19/2024 0900 Gross per 24 hour  Intake 1108.17 ml  Output --  Net 1108.17 ml     Physical Exam    General:  well appearing.  No respiratory difficulty Neck: supple. JVD flat cm.  Cor: PMI nondisplaced. Regular rate & rhythm. No rubs, gallops or murmurs. Lungs: clear Extremities: no cyanosis, clubbing, rash, edema  Neuro: alert & oriented x 3. Moves all 4 extremities w/o difficulty. Affect pleasant.   Telemetry   NSR 60s-70s  Labs    CBC Recent Labs    04/17/24 0231 04/18/24 1112 04/19/24 0346  WBC 8.6  --  8.2  HGB 13.4 12.2*  12.2* 11.2*  HCT 40.9 36.0*  36.0* 34.5*  MCV 91.3  --  92.0  PLT 387  --  328   Basic Metabolic Panel Recent Labs    16/10/96 0231 04/18/24 0246 04/18/24 1112 04/19/24 0346  NA 134* 134* 137  136 134*  K 4.4 5.2* 4.4  4.6 4.6  CL 100 104  --  104  CO2 21* 21*  --  20*  GLUCOSE 125* 109*  --  114*  BUN 37* 44*  --  40*  CREATININE 1.86* 1.63*  --  1.40*  CALCIUM  9.1 9.0  --  8.8*  MG 2.2 2.3  --  2.4  PHOS 4.2  --   --  3.2   Liver Function Tests Recent Labs    04/18/24 0246 04/19/24 0346  AST 12*  --   ALT 9  --   ALKPHOS 42  --    BILITOT 0.7  --   PROT 6.2*  --   ALBUMIN 3.1* 2.9*   No results for input(s): LIPASE, AMYLASE in the last 72 hours. Cardiac Enzymes No results for input(s): CKTOTAL, CKMB, CKMBINDEX, TROPONINI in the last 72 hours.  BNP: BNP (last 3 results) Recent Labs    12/14/23 0945 12/23/23 0933 04/17/24 0231  BNP >4,500.0* 233.1* 616.6*    ProBNP (last 3 results) Recent Labs    04/14/24 1018  PROBNP 5,937.0*     D-Dimer No results for input(s): DDIMER in the last 72 hours. Hemoglobin A1C No results for input(s): HGBA1C in the last 72 hours. Fasting Lipid Panel No results for input(s): CHOL, HDL, LDLCALC, TRIG, CHOLHDL, LDLDIRECT in the last 72 hours. Thyroid  Function Tests No results for input(s): TSH, T4TOTAL, T3FREE, THYROIDAB in the last 72 hours.  Invalid input(s): FREET3  Other results:   Imaging    No results found.   Medications:     Scheduled Medications:  aspirin  EC  81 mg Oral Daily   empagliflozin   10  mg Oral Daily   ezetimibe   10 mg Oral Daily   feeding supplement  237 mL Oral BID BM   heparin   5,000 Units Subcutaneous Q8H   multivitamin with minerals  1 tablet Oral Daily   rosuvastatin   40 mg Oral q1800   sacubitril -valsartan   1 tablet Oral BID   sodium chloride  flush  3 mL Intravenous Q12H    Infusions:    PRN Medications: acetaminophen  **OR** acetaminophen , [COMPLETED] polyethylene glycol **FOLLOWED BY** polyethylene glycol, [COMPLETED] senna-docusate **FOLLOWED BY** senna-docusate  Patient Profile   63 y.o. male with history of chronic systolic CHF/NICM, LBBB, ascending aortic aneurysm, CAD, CKD, HTN, HLD. Admitted with hypertensive crisis and acute on chronic CHF.  Assessment/Plan  1. Acute on chronic systolic HF due to NICM - onset 2017 EF 20%. Cath insig CAD - Echo 2020 EF 40-45% - Echo 4/22 EF 25-30%% -> cath LAD 15% otherwise normal cors.  - Admitted 2/25 for ADHF in setting of medication  non-compliance. cMRI 2/25: Severely dilated LV (7.9 cm) EF 27%  + LGE in basilar inferoseptal wall No LVH. RVEF 42% AscAo aneurysm 4.6cm  - Echo 04/15/24 EF < 20 Markedly dilated RV normal  Moderate AI - Etiology unclear. Suspect HTN as priamry factor but also has LBBB ( now; was 126 ms in 2/25), AI and PVCs - RHC yesterday with low filling pressures and reduced CI.  - Volume status okay. Does not need additional diuretic at this time - He's been hypotensive with restarting home GDMT. Reports adherence but suspect compliance is an issue.  - Continue Jardiance  10 daily - Coreg  and spiro have been held d/t hypotension and hyperkalemia (also received K supp) - Plan to start spiro OP - Continue entresto  24/26 mg BID - Start Digoxin .125 daily - has refused ICD in past - He has severe NICM with almost an 8cm LV. Likely end-stage but reports NYHA I symptoms and working 7 days a week prior to this.  - Think he may be close to advanced therapies but with cachexia and CKD he would be marginal candidate. - Will need genetic testing OP   2. HTN crisis with HF - suspect med compliance is an issue   3. CKD 3b - b/l Scr 1.6-1.7 - Scr stable 1.4 today   4. Aortic valve insufficiency - moderate by echo    5. Ascending thoracic aortic aneurysm - Echocardiogram 4/22:  44 mm - CT 7/22: 44 mm >> rpt 1 year  - cMRI 2/25 46mm   6. PVCs on ECG - does not appear frequent on tele today   7. NSVT  - Keep K> 4.0 Mg > 2.0  Stable for discharge from AHF perspective. Work note provided today. Please sent meds to [facility] pharmacy. Close f/u scheduled in   AHF meds at discharge:  ASA 81 mg daily Digoxin 0.125 mg daily Jardiance  10 mg daily Zetia  10 mg daily Rosuvastatin  40 mg daily Entresto  24-26 mg daily   Length of Stay: 5  Sheryl Donna, NP  04/19/2024, 10:57 AM  Advanced Heart Failure Team Pager [phone] (M-F; 7a - 5p)  Please contact [facility] for night-coverage after hours (5p -7a )  and weekends on [email or web address]

## 2024-04-19 NOTE — TOC Transition Note (Signed)
 Transition of Care [facility]) - Discharge Note   Patient Details  Name: Darius Rivers MRN: [id number] Date of Birth: 25-Jul-1961  [facility] CM/SW Contact:  Benjiman Bras, RN Phone Number: [phone] 04/19/2024, 12:02 PM   Clinical Narrative:     TOC CM spoke to pt and lives alone. Currently works full-time Has scale at home for daily weights. Patient was given Living Better with HF booklet and provided education.  Family will provide transportation.  States he has PCP and will ride by to get information. Explained he will need to follow up with PCP post hospital dc.   Final next level of care: Home/Self Care Barriers to Discharge: No Barriers Identified   Patient Goals and CMS Choice Patient states their goals for this hospitalization and ongoing recovery are:: wants to get back to work          Discharge Placement                       Discharge Plan and Services Additional resources added to the After Visit Summary for     Discharge Planning Services: CM Consult                                 Social Drivers of Health (SDOH) Interventions SDOH Screenings   Food Insecurity: No Food Insecurity (04/14/2024)  Housing: Unknown (04/15/2024)  Transportation Needs: No Transportation Needs (04/14/2024)  Utilities: Not At Risk (04/14/2024)  Alcohol Screen: Low Risk  (04/15/2024)  Financial Resource Strain: Low Risk  (04/15/2024)  Social Connections: Socially Isolated (04/14/2024)  Tobacco Use: Medium Risk (04/14/2024)     Readmission Risk Interventions     No data to display

## 2024-04-21 NOTE — TOC Transition Note (Signed)
 Transition of Care [facility]) - Discharge Note   Patient Details  Name: Darius Rivers MRN: [id number] Date of Birth: [birth year]  Transition of Care [facility] CM/SW Contact:  Benjiman Bras, RN Phone Number: [phone] 04/21/2024, 12:20 PM   Clinical Narrative:    TOC CM contacted pt to follow up on PCP information. Left HIPAA compliant message for return call.    Final next level of care: Home/Self Care Barriers to Discharge: No Barriers Identified   Patient Goals and CMS Choice Patient states their goals for this hospitalization and ongoing recovery are:: wants to get back to work          Discharge Placement                       Discharge Plan and Services Additional resources added to the After Visit Summary for     Discharge Planning Services: CM Consult                                 Social Drivers of Health (SDOH) Interventions SDOH Screenings   Food Insecurity: No Food Insecurity (04/14/2024)  Housing: Unknown (04/15/2024)  Transportation Needs: No Transportation Needs (04/14/2024)  Utilities: Not At Risk (04/14/2024)  Alcohol Screen: Low Risk  (04/15/2024)  Financial Resource Strain: Low Risk  (04/15/2024)  Social Connections: Socially Isolated (04/14/2024)  Tobacco Use: Medium Risk (04/14/2024)     Readmission Risk Interventions     No data to display

## 2024-04-28 ENCOUNTER — Encounter (HOSPITAL_COMMUNITY)

## 2024-05-02 NOTE — Progress Notes (Incomplete)
 ADVANCED HF CLINIC CONSULT NOTE  Referring Physician: No primary care provider on file. Primary Care: No primary care provider on file. Primary Cardiologist: Stanly DELENA Leavens, MD  Chief Complaint:  HPI:     Past Medical History:  Diagnosis Date   Chronic kidney disease stage II    Coronary artery disease    NSTEMI in 11/2018 - Lg scar on Myoview ; Rx medically due to CKD // Cath 4/22: mLAD 15 (no sig CAD)   HFrEF (heart failure with reduced EF)    NICM // Echocardiogram 11/2018: EF 40-45 // Echocardiogram 4/22:  EF 25-30 // Echo 7/22: EF 40-45, global HK normal RVSF, mild LAE, mild MR, moderate AI, mild AV sclerosis without stenosis, mild dilation of aortic root (42 mm)   Hyperlipidemia    Hypertension    Lung nodule    CT 7/22: Left upper lobe 3 mm> repeat chest CT in 1 year   Nephrolithiasis    NSVT (nonsustained ventricular tachycardia)    Prostate cancer (HCC)    watchful waiting, due to go back to urology   Thoracic ascending aortic aneurysm    Echo 4/22: 44 mm // Chest CTA 7/22: Ascending thoracic aortic aneurysm 4.4 cm.  3 mm left upper lobe nodule.  Aortic atherosclerosis.    Current Outpatient Medications  Medication Sig Dispense Refill   aspirin  EC 81 MG tablet Take 81 mg by mouth daily.     digoxin  (LANOXIN ) 0.125 MG tablet Take 1 tablet (0.125 mg total) by mouth daily. 90 tablet 0   empagliflozin  (JARDIANCE ) 10 MG TABS tablet Take 1 tablet (10 mg total) by mouth daily before breakfast. 30 tablet 5   ezetimibe  (ZETIA ) 10 MG tablet Take 1 tablet (10 mg total) by mouth daily. 90 tablet 0   feeding supplement (ENSURE PLUS HIGH PROTEIN) LIQD Take 237 mLs by mouth 2 (two) times daily between meals.     Multiple Vitamin (MULTIVITAMIN WITH MINERALS) TABS tablet Take 1 tablet by mouth daily.     rosuvastatin  (CRESTOR ) 40 MG tablet Take 1 tablet (40 mg total) by mouth daily at 6 PM. 90 tablet 3   sacubitril -valsartan  (ENTRESTO ) 24-26 MG Take 1 tablet by mouth 2 (two)  times daily. 180 tablet 0   senna-docusate (SENOKOT-S) 8.6-50 MG tablet Take 1 tablet by mouth 2 (two) times daily as needed for moderate constipation.     No current facility-administered medications for this visit.    No Known Allergies    Social History   Socioeconomic History   Marital status: Divorced    Spouse name: Not on file   Number of children: 2   Years of education: Not on file   Highest education level: High school graduate  Occupational History   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  Tobacco Use   Smoking status: Former    Current packs/day: 0.00    Average packs/day: 0.3 packs/day for 20.0 years (6.6 ttl pk-yrs)    Types: Cigarettes    Start date: 11/11/1978    Quit date: 11/11/1998    Years since quitting: 25.4   Smokeless tobacco: Never  Vaping Use   Vaping status: Never Used  Substance and Sexual Activity   Alcohol use: Not Currently   Drug use: Yes    Types: Marijuana    Comment: 2 x per month   Sexual activity: Yes  Other Topics Concern   Not on file  Social History Narrative   Not on file   Social Drivers of Health   Financial Resource Strain: Low Risk  (04/15/2024)   Overall Financial Resource Strain (CARDIA)    Difficulty of Paying Living Expenses: Not very hard  Food Insecurity: No Food Insecurity (04/14/2024)   Hunger Vital Sign    Worried About Running Out of Food in the Last Year: Never true    Ran Out of Food in the Last Year: Never true  Transportation Needs: No Transportation Needs (04/14/2024)   PRAPARE - Administrator, Civil Service (Medical): No    Lack of Transportation (Non-Medical): No  Physical Activity: Not on file  Stress: Not on file  Social Connections: Socially Isolated (04/14/2024)   Social Connection and Isolation Panel    Frequency of Communication with Friends and Family: Three times a week    Frequency of Social Gatherings with  Friends and Family: More than three times a week    Attends Religious Services: Never    Database administrator or Organizations: No    Attends Banker Meetings: Never    Marital Status: Widowed  Intimate Partner Violence: Not At Risk (04/14/2024)   Humiliation, Afraid, Rape, and Kick questionnaire    Fear of Current or Ex-Partner: No    Emotionally Abused: No    Physically Abused: No    Sexually Abused: No      Family History  Problem Relation Age of Onset   High blood pressure Mother    Heart attack Neg Hx    Cancer Neg Hx    Diabetes Neg Hx     There were no vitals filed for this visit.  PHYSICAL EXAM: General:  Well appearing. No respiratory difficulty HEENT: normal Neck: supple. no JVD. Carotids 2+ bilat; no bruits. No lymphadenopathy or thryomegaly appreciated. Cor: PMI nondisplaced. Regular rate & rhythm. No rubs, gallops or murmurs. Lungs: clear Abdomen: soft, nontender, nondistended. No hepatosplenomegaly. No bruits or masses. Good bowel sounds. Extremities: no cyanosis, clubbing, rash, edema Neuro: alert & oriented x 3, cranial nerves grossly intact. moves all 4 extremities w/o difficulty. Affect pleasant.  ECG:   ASSESSMENT & PLAN:   [facility]/Np

## 2024-05-04 ENCOUNTER — Telehealth (HOSPITAL_COMMUNITY): Payer: Self-pay

## 2024-05-04 NOTE — Telephone Encounter (Signed)
 Called to confirm/remind patient of their appointment at the [facility] on 05/05/24.   Appointment:   [] Confirmed  [] Left mess   [] No answer/No voice mail  [x] VM Full/unable to leave message  [] Phone not in service

## 2024-05-05 ENCOUNTER — Ambulatory Visit (HOSPITAL_COMMUNITY)
Admit: 2024-05-05 | Discharge: 2024-05-05 | Disposition: A | Discharge status: Home / Self Care | Source: Ambulatory Visit | Attending: Family Medicine | Admitting: Family Medicine

## 2024-05-05 ENCOUNTER — Ambulatory Visit (HOSPITAL_COMMUNITY): Payer: Self-pay | Admitting: Family Medicine

## 2024-05-05 ENCOUNTER — Encounter (HOSPITAL_COMMUNITY): Payer: Self-pay

## 2024-05-05 VITALS — BP 134/84 | HR 60 | Ht 76.0 in | Wt 165.0 lb

## 2024-05-05 DIAGNOSIS — I351 Nonrheumatic aortic (valve) insufficiency: Secondary | ICD-10-CM | POA: Diagnosis not present

## 2024-05-05 DIAGNOSIS — I447 Left bundle-branch block, unspecified: Secondary | ICD-10-CM | POA: Diagnosis not present

## 2024-05-05 DIAGNOSIS — I472 Ventricular tachycardia, unspecified: Secondary | ICD-10-CM | POA: Insufficient documentation

## 2024-05-05 DIAGNOSIS — I5022 Chronic systolic (congestive) heart failure: Secondary | ICD-10-CM | POA: Diagnosis not present

## 2024-05-05 DIAGNOSIS — F1290 Cannabis use, unspecified, uncomplicated: Secondary | ICD-10-CM | POA: Diagnosis not present

## 2024-05-05 DIAGNOSIS — I493 Ventricular premature depolarization: Secondary | ICD-10-CM | POA: Diagnosis not present

## 2024-05-05 DIAGNOSIS — N1832 Chronic kidney disease, stage 3b: Secondary | ICD-10-CM | POA: Insufficient documentation

## 2024-05-05 DIAGNOSIS — Z79899 Other long term (current) drug therapy: Secondary | ICD-10-CM | POA: Insufficient documentation

## 2024-05-05 DIAGNOSIS — I130 Hypertensive heart and chronic kidney disease with heart failure and stage 1 through stage 4 chronic kidney disease, or unspecified chronic kidney disease: Secondary | ICD-10-CM | POA: Diagnosis not present

## 2024-05-05 DIAGNOSIS — I10 Essential (primary) hypertension: Secondary | ICD-10-CM | POA: Diagnosis not present

## 2024-05-05 DIAGNOSIS — R9431 Abnormal electrocardiogram [ECG] [EKG]: Secondary | ICD-10-CM | POA: Insufficient documentation

## 2024-05-05 DIAGNOSIS — I428 Other cardiomyopathies: Secondary | ICD-10-CM | POA: Insufficient documentation

## 2024-05-05 DIAGNOSIS — I2510 Atherosclerotic heart disease of native coronary artery without angina pectoris: Secondary | ICD-10-CM | POA: Insufficient documentation

## 2024-05-05 DIAGNOSIS — N1831 Chronic kidney disease, stage 3a: Secondary | ICD-10-CM

## 2024-05-05 DIAGNOSIS — I7121 Aneurysm of the ascending aorta, without rupture: Secondary | ICD-10-CM | POA: Insufficient documentation

## 2024-05-05 LAB — BASIC METABOLIC PANEL WITH GFR
Anion gap: 7 (ref 5–15)
BUN: 9 mg/dL (ref 8–23)
CO2: 23 mmol/L (ref 22–32)
Calcium: 8.9 mg/dL (ref 8.9–10.3)
Chloride: 108 mmol/L (ref 98–111)
Creatinine, Ser: 1.08 mg/dL (ref 0.61–1.24)
GFR, Estimated: >60 mL/min (ref >60)
Glucose, Bld: 77 mg/dL (ref 70–99)
Potassium: 4 mmol/L (ref 3.5–5.1)
Sodium: 138 mmol/L (ref 135–145)

## 2024-05-05 LAB — MAGNESIUM: Magnesium: 2.1 mg/dL (ref 1.7–2.4)

## 2024-05-05 LAB — BRAIN NATRIURETIC PEPTIDE: B Natriuretic Peptide: 797.5 pg/mL — ABNORMAL HIGH (ref 0.0–100.0)

## 2024-05-05 MED ORDER — SPIRONOLACTONE 25 MG PO TABS: 25.0000 mg | ORAL_TABLET | Freq: Every day | ORAL | 3 refills | Status: AC

## 2024-05-05 NOTE — Patient Instructions (Addendum)
 Thank you for coming in today  If you had labs drawn today, any labs that are abnormal the clinic will call you No news is good news  Call your primary care for appointment Joesph Silvan PA [phone]  Medications: Restart Spironolactone  25 mg 1 tablet daily  Follow up appointments: 1 week for labs BMET and Digoxin  level please hold digoxin  medication this day until labs are complete  Your physician recommends that you schedule a follow-up appointment in:  3 weeks with Pharmacy  3 months With Dr. Bensimhon with echocardiogram Please call our office to schedule the follow-up appointment in August for October 2025.   Do the following things EVERYDAY: Weigh yourself in the morning before breakfast. Write it down and keep it in a log. Take your medicines as prescribed Eat low salt foods--Limit salt (sodium) to 2000 mg per day.  Stay as active as you can everyday Limit all fluids for the day to less than 2 liters   At the [facility], you and your health needs are our priority. As part of our continuing mission to provide you with exceptional heart care, we have created designated Provider Care Teams. These Care Teams include your primary Cardiologist (physician) and Advanced Practice Providers (APPs- Physician Assistants and Nurse Practitioners) who all work together to provide you with the care you need, when you need it.   You may see any of the following providers on your designated Care Team at your next follow up: Dr Toribio Fuel Dr Ezra Shuck Dr. Ria Gardenia Greig Lenetta, NP Caffie Shed, [place] [facility] [place], [place] Beckey Coe, NP Tinnie Redman, PharmD   Please be sure to bring in all your medications bottles to every appointment.    Thank you for choosing [facility]  If you have any questions or concerns before your next appointment please send us  a message through [place] or call our  office at [phone].    TO LEAVE A MESSAGE FOR THE NURSE SELECT OPTION 2, PLEASE LEAVE A MESSAGE INCLUDING: YOUR NAME DATE OF BIRTH CALL BACK NUMBER REASON FOR CALL**this is important as we prioritize the call backs  YOU WILL RECEIVE A CALL BACK THE SAME DAY AS LONG AS YOU CALL BEFORE 4:00 PM

## 2024-05-05 NOTE — Addendum Note (Signed)
 Encounter addended by: Glena Harlene HERO, FNP on: 05/05/2024 4:38 PM  Actions taken: Clinical Note Signed

## 2024-05-12 ENCOUNTER — Other Ambulatory Visit (HOSPITAL_COMMUNITY)

## 2024-05-18 ENCOUNTER — Ambulatory Visit (HOSPITAL_COMMUNITY)
Admission: RE | Admit: 2024-05-18 | Discharge: 2024-05-18 | Disposition: A | Discharge status: Home / Self Care | Source: Ambulatory Visit | Attending: Cardiology | Admitting: Cardiology

## 2024-05-18 DIAGNOSIS — I5022 Chronic systolic (congestive) heart failure: Secondary | ICD-10-CM | POA: Diagnosis present

## 2024-05-18 LAB — BASIC METABOLIC PANEL WITH GFR
Anion gap: 10 (ref 5–15)
BUN: 11 mg/dL (ref 8–23)
CO2: 24 mmol/L (ref 22–32)
Calcium: 9.4 mg/dL (ref 8.9–10.3)
Chloride: 106 mmol/L (ref 98–111)
Creatinine, Ser: 1.13 mg/dL (ref 0.61–1.24)
GFR, Estimated: >60 mL/min (ref >60)
Glucose, Bld: 96 mg/dL (ref 70–99)
Potassium: 4.1 mmol/L (ref 3.5–5.1)
Sodium: 140 mmol/L (ref 135–145)

## 2024-05-18 LAB — DIGOXIN LEVEL: Digoxin Level: 0.5 ng/mL — ABNORMAL LOW (ref 0.8–2.0)

## 2024-05-23 NOTE — Progress Notes (Incomplete)
 ***In Progress***    Advanced Heart Failure Clinic Note   HPI:  Darius Rivers is a 63 y.o. male with systolic HF due to NICM, CAD, NSVT, CKD, HTN, HL, LBBB and asc Ao aneurysm (4.6 cm).     Diagnosed with HF in 2017 @ [facility]. EF 20% Cath with non-obs CAD.   Admitted 1/20 for N&V, abd pain, hypotension and lactic acidosis. Elevated troponin>>peak 1.57>>echo w/ EF 40-45%>>MV EF 34% w/inferior scar but no ischemia, no cath 2nd acute on chronic CKD (peak Cr 4.78), meds adjusted. Cr at d/c 1.26.    Echo 4/22: EF 25-30%% -> cath LAD 15% otherwise normal cors. RA 0 PA 5/1 PCWP 1  Fick 4.4/2.1   Admitted 2/25 for ADHF in setting of medication non-compliance. cMRI 2/25: Severely dilated LV (7.9 cm) EF 27%  + LGE in basilar inferoseptal wall No LVH. RVEF 42% AscAo aneurysm 4.6 cm.    Admitted 04/14/24 with decompensated HF in setting of HTN crisis. Echo EF < 20%, RV normal, moderate AI. BP in ED 160/110. Diuresed with IV lasix . RHC showed low filling pressures and reduced CI 1.97. GDMT titrated, digoxin  added with low CI. He was discharged home, weight 128 lbs.   Today he returns for post hospital HF follow up. Overall feeling fine. He is not SOB with activity. He is back at work at [employer] 3 days/week (previously worked 7 days/week). Denies increasing SOB, palpitations, abnormal bleeding, CP, dizziness, edema, or PND/Orthopnea. Appetite ok.  Weight at home 148 pounds. Taking all medications. 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 Smokes THC, rare ETOH drinks/week, had Lifevest in past but refused ICD.  Today he returns to HF clinic for pharmacist medication titration. At last visit with APP ***.   Overall feeling ***. Dizziness, lightheadedness, fatigue:  Chest pain or palpitations:  How is your breathing?: *** SOB: Able to complete all ADLs. Activity level ***  Weight at home pounds. Takes furosemide /torsemide/bumex *** mg *** daily.  LEE PND/Orthopnea  Appetite  *** Low-salt diet:   Physical Exam Cost/affordability of meds   HF Medications:   Has the patient been experiencing any side effects to the medications prescribed?  {YES NO:22349}  Does the patient have any problems obtaining medications due to transportation or finances?   {YES NO:22349}  Understanding of regimen: {excellent/good/fair/poor:19665} Understanding of indications: {excellent/good/fair/poor:19665} Potential of compliance: {excellent/good/fair/poor:19665} Patient understands to avoid NSAIDs. Patient understands to avoid decongestants.    Pertinent Lab Values: Serum creatinine ***, BUN ***, Potassium ***, Sodium ***, BNP ***, Magnesium  ***, Digoxin  ***   Vital Signs: Weight: *** (last clinic weight: ***) Blood pressure: ***  Heart rate: ***   Assessment/Plan: 1. Chronic systolic HF - NICM. - onset 2017 EF 20%. Cath insig CAD - Echo 2020: EF 40-45% - Echo 4/22: EF 25-30%% -> cath LAD 15% otherwise normal cors.  - cMRI 2/25: Severely dilated LV (7.9 cm) EF 27%  + LGE in basilar inferoseptal wall No LVH. RVEF 42% AscAo aneurysm 4.6cm  - Echo 6/25: EF < 20 Markedly dilated RV normal  Moderate AI - Etiology unclear. Suspect HTN as primary factor, but also has LBBB (142 ms now; was 126 ms in 2/25), AI and PVCs. - RHC 6/25: with low filling pressures and reduced CI.  - He has severe NICM with almost an 8 cm LV. Likely end-stage but reports NYHA I symptoms, volume OK. - Add Coreg  next if BP/HR allow. - Continue Entresto  24/26 mg BID. - Continue spironolactone   25 mg daily. - Continue Jardiance  10 daily. - Continue digoxin  0.125 mg daily. - Has refused ICD in past. - May be close to advanced therapies but with cachexia and CKD he would be marginal candidate. - Given his height, aortic dilation with moderate AR, would consider evaluation for Marfan syndrome with FBN1 testing; will send genetic testing today. - BMET and BNP today. Repeat BMET and dig trough in 1 week.    2. HTN  - BP mildly elevated today - Restart spiro as above   3. CKD 3b - baseline Scr 1.6-1.7 - Continue SGLT2i - Labs today.   4. Aortic valve insufficiency - Moderate by echo    5. Ascending thoracic aortic aneurysm - Echo 4/22: 44 mm - CT 7/22: 44 mm >> rpt 1 year  - cMRI 2/25: 46 mm   6. NSVT/PVCs - Keep K > 4.0, Mg > 2.0 - None on ECG today. - Labs today  Follow up ***   Tinnie Redman, PharmD, BCPS, BCCP, CPP Heart Failure Clinic Pharmacist [phone]      ADVANCED HF CLINIC CONSULT NOTE  Primary Care: Joesph Silvan, [place] Primary Cardiologist: Stanly DELENA Leavens, MD HF Cardiologist: Dr. Cherrie  HPI:   Cardiac Studies  - RHC 6/25: RA 1, PA 18/7 (10), PCWP 7, CO/CI (Fick) 3.85/1.97, PVR 0.3 WU, PAPi 11  - Echo 6/25: EF < 20%, markedly dilated RV normal, moderate AI   - cMRI 2/25: Severely dilated LV (7.9 cm) EF 27%  + LGE in basilar inferoseptal wall No LVH. RVEF 42% AscAo aneurysm 4.6 cm.   - R/LHC 4/22: RA 0, PA 5/1, PCWP 1, CO/CI (Fick) 4.4/2.1; 15% LAD otherwise normal cors.  - Echo 4/22: EF 25-30%, normal RV.  Past Medical History:  Diagnosis Date   Chronic kidney disease stage II    Coronary artery disease    NSTEMI in 11/2018 - Lg scar on Myoview ; Rx medically due to CKD // Cath 4/22: mLAD 15 (no sig CAD)   HFrEF (heart failure with reduced EF)    NICM // Echocardiogram 11/2018: EF 40-45 // Echocardiogram 4/22:  EF 25-30 // Echo 7/22: EF 40-45, global HK normal RVSF, mild LAE, mild MR, moderate AI, mild AV sclerosis without stenosis, mild dilation of aortic root (42 mm)   Hyperlipidemia    Hypertension    Lung nodule    CT 7/22: Left upper lobe 3 mm> repeat chest CT in 1 year   Nephrolithiasis    NSVT (nonsustained ventricular tachycardia)    Prostate cancer (HCC)    watchful waiting, due to go back to urology   Thoracic ascending aortic aneurysm    Echo 4/22: 44 mm // Chest CTA 7/22: Ascending thoracic aortic aneurysm 4.4 cm.  3  mm left upper lobe nodule.  Aortic atherosclerosis.   Current Outpatient Medications  Medication Sig Dispense Refill   aspirin  EC 81 MG tablet Take 81 mg by mouth daily.     digoxin  (LANOXIN ) 0.125 MG tablet Take 1 tablet (0.125 mg total) by mouth daily. 90 tablet 0   empagliflozin  (JARDIANCE ) 10 MG TABS tablet Take 1 tablet (10 mg total) by mouth daily before breakfast. 30 tablet 5   ezetimibe  (ZETIA ) 10 MG tablet Take 1 tablet (10 mg total) by mouth daily. 90 tablet 0   feeding supplement (ENSURE PLUS HIGH PROTEIN) LIQD Take 237 mLs by mouth 2 (two) times daily between meals.     Multiple Vitamin (MULTIVITAMIN WITH MINERALS) TABS tablet Take 1 tablet by  mouth daily.     rosuvastatin  (CRESTOR ) 40 MG tablet Take 1 tablet (40 mg total) by mouth daily at 6 PM. 90 tablet 3   sacubitril -valsartan  (ENTRESTO ) 24-26 MG Take 1 tablet by mouth 2 (two) times daily. 180 tablet 0   senna-docusate (SENOKOT-S) 8.6-50 MG tablet Take 1 tablet by mouth 2 (two) times daily as needed for moderate constipation.     spironolactone  (ALDACTONE ) 25 MG tablet Take 1 tablet (25 mg total) by mouth daily. 90 tablet 3   No current facility-administered medications for this encounter.   No Known Allergies  Social History   Socioeconomic History   Marital status: Divorced    Spouse name: Not on file   Number of children: 2   Years of education: Not on file   Highest education level: High school graduate  Occupational History   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  Tobacco Use   Smoking status: Former    Current packs/day: 0.00    Average packs/day: 0.3 packs/day for 20.0 years (6.6 ttl pk-yrs)    Types: Cigarettes    Start date: 11/11/1978    Quit date: 11/11/1998    Years since quitting: 25.5   Smokeless tobacco: Never  Vaping Use   Vaping status: Never Used  Substance and Sexual Activity   Alcohol use: Not Currently   Drug use:  Yes    Types: Marijuana    Comment: 2 x per month   Sexual activity: Yes  Other Topics Concern   Not on file  Social History Narrative   Not on file   Social Drivers of Health   Financial Resource Strain: Low Risk  (04/15/2024)   Overall Financial Resource Strain (CARDIA)    Difficulty of Paying Living Expenses: Not very hard  Food Insecurity: No Food Insecurity (04/14/2024)   Hunger Vital Sign    Worried About Running Out of Food in the Last Year: Never true    Ran Out of Food in the Last Year: Never true  Transportation Needs: No Transportation Needs (04/14/2024)   PRAPARE - Administrator, Civil Service (Medical): No    Lack of Transportation (Non-Medical): No  Physical Activity: Not on file  Stress: Not on file  Social Connections: Socially Isolated (04/14/2024)   Social Connection and Isolation Panel    Frequency of Communication with Friends and Family: Three times a week    Frequency of Social Gatherings with Friends and Family: More than three times a week    Attends Religious Services: Never    Database administrator or Organizations: No    Attends Banker Meetings: Never    Marital Status: Widowed  Intimate Partner Violence: Not At Risk (04/14/2024)   Humiliation, Afraid, Rape, and Kick questionnaire    Fear of Current or Ex-Partner: No    Emotionally Abused: No    Physically Abused: No    Sexually Abused: No   Family History  Problem Relation Age of Onset   High blood pressure Mother    Heart attack Neg Hx    Cancer Neg Hx    Diabetes Neg Hx    Wt Readings from Last 3 Encounters:  05/05/24 165 lb (74.8 kg)  04/19/24 147 lb 6.4 oz (66.9 kg)  12/23/23 164 lb 12.8 oz (74.8 kg)   There were no vitals taken for this visit.  PHYSICAL EXAM: General:  NAD. No resp  difficulty, walked into clinic, tall HEENT: Normal Neck: Supple. No JVD. Cor: Regular rate & rhythm. No rubs, gallops or murmurs. Lungs: Clear, decreased BS LLL Abdomen: Soft,  nontender, nondistended.  Extremities: No cyanosis, clubbing, rash, edema Neuro: Alert & oriented x 3, moves all 4 extremities w/o difficulty. Affect pleasant.  ECG (personally reviewed): NSR, LVH 71 bpm  ASSESSMENT & PLAN:   I personally called his daughter, September, to discuss details of today's visit. She moved from [place] to [place] to help with her parents' care. She requests an update after his visits, unless she is able to be present.   Follow up in 3 weeks with PharmD (add beta blocker if able) and 3 months with Dr. Cherrie + echo  [place], FNP-BC 05/23/24

## 2024-05-25 ENCOUNTER — Ambulatory Visit (HOSPITAL_COMMUNITY)
Admission: RE | Admit: 2024-05-25 | Discharge: 2024-05-25 | Disposition: A | Discharge status: Home / Self Care | Source: Ambulatory Visit | Attending: Cardiology | Admitting: Cardiology

## 2024-05-25 VITALS — BP 136/90 | HR 75 | Wt 165.6 lb

## 2024-05-25 DIAGNOSIS — I5022 Chronic systolic (congestive) heart failure: Secondary | ICD-10-CM | POA: Insufficient documentation

## 2024-05-25 DIAGNOSIS — Z7984 Long term (current) use of oral hypoglycemic drugs: Secondary | ICD-10-CM | POA: Insufficient documentation

## 2024-05-25 DIAGNOSIS — I493 Ventricular premature depolarization: Secondary | ICD-10-CM | POA: Insufficient documentation

## 2024-05-25 DIAGNOSIS — I130 Hypertensive heart and chronic kidney disease with heart failure and stage 1 through stage 4 chronic kidney disease, or unspecified chronic kidney disease: Secondary | ICD-10-CM | POA: Insufficient documentation

## 2024-05-25 DIAGNOSIS — Z79899 Other long term (current) drug therapy: Secondary | ICD-10-CM | POA: Insufficient documentation

## 2024-05-25 DIAGNOSIS — I2510 Atherosclerotic heart disease of native coronary artery without angina pectoris: Secondary | ICD-10-CM | POA: Insufficient documentation

## 2024-05-25 DIAGNOSIS — I7121 Aneurysm of the ascending aorta, without rupture: Secondary | ICD-10-CM | POA: Diagnosis not present

## 2024-05-25 DIAGNOSIS — I351 Nonrheumatic aortic (valve) insufficiency: Secondary | ICD-10-CM | POA: Insufficient documentation

## 2024-05-25 DIAGNOSIS — N1832 Chronic kidney disease, stage 3b: Secondary | ICD-10-CM | POA: Insufficient documentation

## 2024-05-25 DIAGNOSIS — I447 Left bundle-branch block, unspecified: Secondary | ICD-10-CM | POA: Insufficient documentation

## 2024-05-25 DIAGNOSIS — I472 Ventricular tachycardia, unspecified: Secondary | ICD-10-CM | POA: Diagnosis not present

## 2024-05-25 DIAGNOSIS — I428 Other cardiomyopathies: Secondary | ICD-10-CM | POA: Insufficient documentation

## 2024-05-25 MED ORDER — CARVEDILOL 3.125 MG PO TABS: 3.1250 mg | ORAL_TABLET | Freq: Two times a day (BID) | ORAL | 3 refills | Status: DC

## 2024-05-25 NOTE — Patient Instructions (Signed)
 It was a pleasure seeing you today!  MEDICATIONS: -We are changing your medications today -Start carvedilol  3.125 mg (1 tablet) twice daily. -Call if you have questions about your medications.   NEXT APPOINTMENT: Return to clinic in 3 weeks with Pharmacy Clinic.  In general, to take care of your heart failure: -Limit your fluid intake to 2 Liters (half-gallon) per day.   -Limit your salt intake to ideally 2-3 grams (2000-3000 mg) per day. -Weigh yourself daily and record, and bring that weight diary to your next appointment.  (Weight gain of 2-3 pounds in 1 day typically means fluid weight.) -The medications for your heart are to help your heart and help you live longer.   -Please contact us  before stopping any of your heart medications.  Call the clinic at [phone] with questions or to reschedule future appointments.

## 2024-05-25 NOTE — Progress Notes (Signed)
 Advanced Heart Failure Clinic Note   Primary Care: Joesph Silvan, [place] Primary Cardiologist: Stanly DELENA Leavens, MD HF Cardiologist: Dr. Cherrie  HPI:  Darius Rivers is a 63 y.o. male with systolic HF due to NICM, CAD, NSVT, CKD, HTN, HL, LBBB and asc Ao aneurysm (4.6 cm).     Diagnosed with HF in 2017 @ [facility]. EF 20% Cath with non-obs CAD.   Admitted 11/2018 for N&V, abd pain, hypotension and lactic acidosis. Elevated troponin>>peak 1.57>>echo w/ EF 40-45%>>Myocardial SPECT showed LVEF 34% w/ inferior scar but no ischemia, no heart cath. 2nd acute on chronic CKD (peak Cr 4.78), meds adjusted. Cr at d/c 1.26.    Echo 02/2021: EF 25-30% -> cath LAD 15% otherwise normal cors. RA 0 PA 5/1 PCWP 1  Fick 4.4/2.1  ECHO 09/23/2022 showed LVEF 30-35%, global hypokinesis, severe LVH, RV normal.   ECHO 09/16/2023 showed LVEF 35-40%, moderate LVH, grade II DD.    Admitted 12/2023 for ADHF in setting of medication non-compliance. cMRI 12/2023: Severely dilated LV (7.9 cm) EF 27%  + LGE in basilar inferoseptal wall. No LVH. RVEF 42% AscAo aneurysm 4.6 cm. His medications at discharge were carvedilol  25 mg BID, Entresto  97/103 mg BID, spironolactone  25 mg daily, hydralazine  100 mg TID, isosorbide  mononitrate 30 mg daily, and furosemide  40 mg.   Admitted 04/14/2024 with decompensated HF in setting of HTN crisis. Echo EF < 20%, RV normal, moderate AI. BP in ED 160/110. Diuresed with IV furosemide . RHC showed low filling pressures and reduced CI 1.97. GDMT adjusted, digoxin  added with low CI. He was discharged home, weight 128 lbs. His medications at discharge were Entresto  24/26 mg BID, Jardiance  10 mg daily, and digoxin  0.125 mg daily.   On 05/05/2024 he returned for post hospital HF follow up. Overall felt fine. He was not SOB with activity. He was back at work at [employer] 3 days/week (previously worked 7 days/week). Denied increasing SOB, palpitations, abnormal bleeding, CP, dizziness, edema, or  PND/Orthopnea. Appetite was ok.  Reported taking all medications. 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 Reported smoking THC, rare ETOH drinks/week, had Lifevest in past but refused ICD.  Today he returns to HF clinic for pharmacist medication titration. At last visit with APP spironolactone  25 mg daily was started. Overall he says he is feeling pretty good. Recently lowered his work load from 7 days to 3 days a week and this has helped him. Denies dizziness, lightheadedness, fatigue, CP, or palpitations. He says his breathing has been good. Denies SOB. [place] he has not been walking too much but feels like he is able to walk ~ 1 mile without discomfort. His weight at home has been stable ~ 160-165 lbs. No LEE. Denies PND / orthopnea, uses 1 pillow to sleep on. He says his appetite has been good, and he tries to limit salt intake.   HF Medications: Entresto  24/26 mg BID Spironolactone  25 mg daily Jardiance  10 mg daily Digoxin  0.125 mg daily   Has the patient been experiencing any side effects to the medications prescribed?  None reported.  Does the patient have any problems obtaining medications due to transportation or finances?  No. He has [name], therapeutic. He has been told to contact clinic if he runs into any problems obtaining / affording medications, but states he has had no issues so far.   Understanding of regimen: good Understanding of indications: good Potential of compliance: good Patient understands to avoid NSAIDs. Patient  understands to avoid decongestants.    Pertinent Lab Values: 05/18/2024: Serum creatinine 1.13, BUN 11, Potassium 4.1, Sodium 140, BNP 797.5 (05/05/2024), Magnesium  2.1 (05/05/2024), Digoxin  0.5   Vital Signs: Weight: 165.6 lbs (last clinic weight: 165 lbs) Blood pressure: 136/90 mmHg Heart rate: 75 bpm  Assessment/Plan: 1. Chronic systolic HF - NICM. - Onset 2017 EF 20%. Cath insig CAD - Echo 2020: EF 40-45% -  Echo 02/2021: EF 25-30%% -> cath LAD 15% otherwise normal cors.  - cMRI 12/2023: Severely dilated LV (7.9 cm) EF 27%  + LGE in basilar inferoseptal wall No LVH. RVEF 42% AscAo aneurysm 4.6cm  - Echo 04/2024: EF < 20 Markedly dilated RV normal  Moderate AI - Etiology unclear. Suspect HTN as primary factor, but also has LBBB (142 ms now; was 126 ms in 2/25), AI and PVCs. - RHC 04/2024: with low filling pressures and reduced CI.  - He has severe NICM with almost an 8 cm LV. Likely end-stage. - NYHA I-II, appears euvolemic on exam. - Start carvedilol  3.125 mg BID. Previously tolerated up to 25 mg BID. HR 75 today. - Continue Entresto  24/26 mg BID. Aim to increase this at next visit.  - Continue spironolactone  25 mg daily. - Continue Jardiance  10 daily. - Continue digoxin  0.125 mg daily.  - Has refused ICD in past.   2. HTN  - BP 136/90 today  - Continue Entresto  24/26 mg BID - Continue spironolactone  25 mg daily  - Start carvedilol  as above   3. CKD 3b - Baseline Scr 1.6-1.7 - Continue Jardiance  10 mg daily   4. Aortic valve insufficiency - Moderate by echo    5. Ascending thoracic aortic aneurysm - Echo 02/2021: 44 mm - CT 05/2021: 44 mm - cMRI 12/2023: 46 mm   6. NSVT/PVCs - Keep K > 4.0, Mg > 2.0  Follow up with PharmD in 3 weeks with goal to increase Entresto . He will let us  know if he has any side effects to the carvedilol .    I personally called his daughter, September, to discuss details of today's visit. She moved from [place] to [place] to help with her parents' care. She requests an update after his visits, unless she is able to be present. I left VM with number to reach me so changes could be discussed.  Tinnie Redman, PharmD, BCPS, BCCP, CPP Heart Failure Clinic Pharmacist [phone]

## 2024-06-03 NOTE — Progress Notes (Incomplete)
 Advanced Heart Failure Clinic Note   Primary Care: Darius Rivers, [place] Primary Cardiologist: Darius DELENA Leavens, MD HF Cardiologist: Dr. Cherrie  HPI:  Darius Rivers is a 63 y.o. male with systolic HF due to NICM, CAD, NSVT, CKD, HTN, HL, LBBB and asc Ao aneurysm (4.6 cm).     Diagnosed with HF in 2017 @ [facility]. EF 20% Cath with non-obs CAD.   Admitted 11/2018 for N&V, abd pain, hypotension and lactic acidosis. Elevated troponin>>peak 1.57>>echo w/ EF 40-45%>>Myocardial SPECT showed LVEF 34% w/ inferior scar but no ischemia, no heart cath. 2nd acute on chronic CKD (peak Cr 4.78), meds adjusted. Cr at d/c 1.26.    Echo 02/2021: EF 25-30% -> cath LAD 15% otherwise normal cors. RA 0 PA 5/1 PCWP 1  Fick 4.4/2.1  ECHO 09/23/2022 showed LVEF 30-35%, global hypokinesis, severe LVH, RV normal.   ECHO 09/16/2023 showed LVEF 35-40%, moderate LVH, grade II DD.    Admitted 12/2023 for ADHF in setting of medication non-compliance. cMRI 12/2023: Severely dilated LV (7.9 cm) EF 27%  + LGE in basilar inferoseptal wall. No LVH. RVEF 42% AscAo aneurysm 4.6 cm. His medications at discharge were carvedilol  25 mg BID, Entresto  97/103 mg BID, spironolactone  25 mg daily, hydralazine  100 mg TID, isosorbide  mononitrate 30 mg daily, and furosemide  40 mg.   Admitted 04/14/2024 with decompensated HF in setting of HTN crisis. Echo EF < 20%, RV normal, moderate AI. BP in ED 160/110. Diuresed with IV furosemide . RHC showed low filling pressures and reduced CI 1.97. GDMT adjusted, digoxin  added with low CI. He was discharged home, weight 128 lbs. His medications at discharge were Entresto  24/26 mg BID, Jardiance  10 mg daily, and digoxin  0.125 mg daily.   On 05/05/2024 he returned for post hospital HF follow up. Overall felt fine. He was not SOB with activity. He was back at work at [employer] 3 days/week (previously worked 7 days/week). Denied increasing SOB, palpitations, abnormal bleeding, CP, dizziness, edema, or  PND/Orthopnea. Appetite was ok.  Reported taking all medications. 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 Reported smoking THC, rare ETOH drinks/week, had Lifevest in past but refused ICD.  Today he returns to HF clinic for pharmacist medication titration. At recent visits to clinic, spironolactone  25 mg daily and carvedilol  3.125 mg BID were started.   HF Medications: Carvedilol  3.125 mg BID Entresto  24/26 mg BID Spironolactone  25 mg daily Jardiance  10 mg daily Digoxin  0.125 mg daily   Has the patient been experiencing any side effects to the medications prescribed?  None reported.  Does the patient have any problems obtaining medications due to transportation or finances?  No. He has [name], therapeutic. He has been told to contact clinic if he runs into any problems obtaining / affording medications, but states he has had no issues so far.   Understanding of regimen: good Understanding of indications: good Potential of compliance: good Patient understands to avoid NSAIDs. Patient understands to avoid decongestants.    Pertinent Lab Values: 05/18/2024: Serum creatinine 1.13, BUN 11, Potassium 4.1, Sodium 140, BNP 797.5 (05/05/2024), Magnesium  2.1 (05/05/2024), Digoxin  0.5   Vital Signs:*** Weight: 165.6 lbs (last clinic weight: 165 lbs) Blood pressure: 136/90 mmHg Heart rate: 75 bpm  Assessment/Plan: 1. Chronic systolic HF - NICM. - Onset 2017 EF 20%. Cath insig CAD - Echo 2020: EF 40-45% - Echo 02/2021: EF 25-30%% -> cath LAD 15% otherwise normal cors.  - cMRI 12/2023: Severely dilated LV (7.9 cm)  EF 27%  + LGE in basilar inferoseptal wall No LVH. RVEF 42% AscAo aneurysm 4.6cm  - Echo 04/2024: EF < 20 Markedly dilated RV normal  Moderate AI - Etiology unclear. Suspect HTN as primary factor, but also has LBBB (142 ms now; was 126 ms in 2/25), AI and PVCs. - RHC 04/2024: with low filling pressures and reduced CI.  - He has severe NICM with almost  an 8 cm LV. Likely end-stage. - NYHA I-II, appears euvolemic on exam. - Continue carvedilol  3.125 mg BID. Previously tolerated up to 25 mg BID.[name] - Continue Entresto  24/26 mg BID. Aim to increase this at next visit. *** - Continue spironolactone  25 mg daily. - Continue Jardiance  10 daily. - Continue digoxin  0.125 mg daily.  - Has refused ICD in past.   2. HTN *** - Continue Entresto  24/26 mg BID - Continue spironolactone  25 mg daily  - Start carvedilol  as above   3. CKD 3b - Baseline Scr 1.6-1.7 - Continue Jardiance  10 mg daily   4. Aortic valve insufficiency - Moderate by echo    5. Ascending thoracic aortic aneurysm - Echo 02/2021: 44 mm - CT 05/2021: 44 mm - cMRI 12/2023: 46 mm   6. NSVT/PVCs - Keep K > 4.0, Mg > 2.0  Follow up ***  I personally called his daughter, Darius Rivers, to discuss details of today's visit. She moved from [place] to [place] to help with her parents' care. She requests an update after his visits, unless she is able to be present. I left VM with number to reach me so changes could be discussed. ***  Darius Rivers, PharmD, BCPS, BCCP, CPP Heart Failure Clinic Pharmacist [phone]

## 2024-06-14 ENCOUNTER — Encounter (HOSPITAL_BASED_OUTPATIENT_CLINIC_OR_DEPARTMENT_OTHER): Payer: Self-pay | Admitting: Urology

## 2024-06-14 ENCOUNTER — Emergency Department (HOSPITAL_BASED_OUTPATIENT_CLINIC_OR_DEPARTMENT_OTHER)

## 2024-06-14 ENCOUNTER — Inpatient Hospital Stay (HOSPITAL_BASED_OUTPATIENT_CLINIC_OR_DEPARTMENT_OTHER)
Admission: EM | Admit: 2024-06-14 | Discharge: 2024-06-21 | DRG: 603 | Disposition: A | Discharge status: Home / Self Care | Attending: Osteopathic Medicine | Admitting: Osteopathic Medicine

## 2024-06-14 ENCOUNTER — Other Ambulatory Visit: Payer: Self-pay

## 2024-06-14 DIAGNOSIS — L0390 Cellulitis, unspecified: Secondary | ICD-10-CM | POA: Diagnosis present

## 2024-06-14 DIAGNOSIS — N183 Chronic kidney disease, stage 3 unspecified: Secondary | ICD-10-CM | POA: Diagnosis present

## 2024-06-14 DIAGNOSIS — Z7984 Long term (current) use of oral hypoglycemic drugs: Secondary | ICD-10-CM

## 2024-06-14 DIAGNOSIS — Z79899 Other long term (current) drug therapy: Secondary | ICD-10-CM

## 2024-06-14 DIAGNOSIS — I351 Nonrheumatic aortic (valve) insufficiency: Secondary | ICD-10-CM | POA: Diagnosis present

## 2024-06-14 DIAGNOSIS — L03116 Cellulitis of left lower limb: Principal | ICD-10-CM | POA: Diagnosis present

## 2024-06-14 DIAGNOSIS — I252 Old myocardial infarction: Secondary | ICD-10-CM

## 2024-06-14 DIAGNOSIS — I2510 Atherosclerotic heart disease of native coronary artery without angina pectoris: Secondary | ICD-10-CM | POA: Diagnosis present

## 2024-06-14 DIAGNOSIS — Z8546 Personal history of malignant neoplasm of prostate: Secondary | ICD-10-CM

## 2024-06-14 DIAGNOSIS — I7121 Aneurysm of the ascending aorta, without rupture: Secondary | ICD-10-CM | POA: Diagnosis present

## 2024-06-14 DIAGNOSIS — D631 Anemia in chronic kidney disease: Secondary | ICD-10-CM | POA: Diagnosis present

## 2024-06-14 DIAGNOSIS — Z87891 Personal history of nicotine dependence: Secondary | ICD-10-CM

## 2024-06-14 DIAGNOSIS — I5022 Chronic systolic (congestive) heart failure: Secondary | ICD-10-CM | POA: Diagnosis present

## 2024-06-14 DIAGNOSIS — B964 Proteus (mirabilis) (morganii) as the cause of diseases classified elsewhere: Secondary | ICD-10-CM | POA: Diagnosis present

## 2024-06-14 DIAGNOSIS — Z23 Encounter for immunization: Secondary | ICD-10-CM

## 2024-06-14 DIAGNOSIS — Z8249 Family history of ischemic heart disease and other diseases of the circulatory system: Secondary | ICD-10-CM

## 2024-06-14 DIAGNOSIS — I130 Hypertensive heart and chronic kidney disease with heart failure and stage 1 through stage 4 chronic kidney disease, or unspecified chronic kidney disease: Secondary | ICD-10-CM | POA: Diagnosis present

## 2024-06-14 DIAGNOSIS — L089 Local infection of the skin and subcutaneous tissue, unspecified: Principal | ICD-10-CM

## 2024-06-14 DIAGNOSIS — Z7982 Long term (current) use of aspirin: Secondary | ICD-10-CM

## 2024-06-14 DIAGNOSIS — I10 Essential (primary) hypertension: Secondary | ICD-10-CM | POA: Diagnosis present

## 2024-06-14 DIAGNOSIS — D649 Anemia, unspecified: Secondary | ICD-10-CM

## 2024-06-14 DIAGNOSIS — E785 Hyperlipidemia, unspecified: Secondary | ICD-10-CM | POA: Diagnosis present

## 2024-06-14 DIAGNOSIS — I428 Other cardiomyopathies: Secondary | ICD-10-CM | POA: Diagnosis present

## 2024-06-14 DIAGNOSIS — I5020 Unspecified systolic (congestive) heart failure: Secondary | ICD-10-CM | POA: Diagnosis present

## 2024-06-14 LAB — CBC WITH DIFFERENTIAL/PLATELET
Abs Immature Granulocytes: 0.02 K/uL (ref 0.00–0.07)
Basophils Absolute: 0 K/uL (ref 0.0–0.1)
Basophils Relative: 0 %
Eosinophils Absolute: 0.2 K/uL (ref 0.0–0.5)
Eosinophils Relative: 2 %
HCT: 29.5 % — ABNORMAL LOW (ref 39.0–52.0)
Hemoglobin: 9.4 g/dL — ABNORMAL LOW (ref 13.0–17.0)
Immature Granulocytes: 0 %
Lymphocytes Relative: 21 %
Lymphs Abs: 2 K/uL (ref 0.7–4.0)
MCH: 29.9 pg (ref 26.0–34.0)
MCHC: 31.9 g/dL (ref 30.0–36.0)
MCV: 93.9 fL (ref 80.0–100.0)
Monocytes Absolute: 0.5 K/uL (ref 0.1–1.0)
Monocytes Relative: 5 %
Neutro Abs: 6.6 K/uL (ref 1.7–7.7)
Neutrophils Relative %: 72 %
Platelets: 434 K/uL — ABNORMAL HIGH (ref 150–400)
RBC: 3.14 MIL/uL — ABNORMAL LOW (ref 4.22–5.81)
RDW: 17.4 % — ABNORMAL HIGH (ref 11.5–15.5)
WBC: 9.3 K/uL (ref 4.0–10.5)
nRBC: 0 % (ref 0.0–0.2)

## 2024-06-14 LAB — COMPREHENSIVE METABOLIC PANEL WITH GFR
ALT: <5 U/L (ref 0–44)
AST: 16 U/L (ref 15–41)
Albumin: 3.6 g/dL (ref 3.5–5.0)
Alkaline Phosphatase: 60 U/L (ref 38–126)
Anion gap: 11 (ref 5–15)
BUN: 10 mg/dL (ref 8–23)
CO2: 24 mmol/L (ref 22–32)
Calcium: 9.3 mg/dL (ref 8.9–10.3)
Chloride: 106 mmol/L (ref 98–111)
Creatinine, Ser: 1.11 mg/dL (ref 0.61–1.24)
GFR, Estimated: >60 mL/min (ref >60)
Glucose, Bld: 120 mg/dL — ABNORMAL HIGH (ref 70–99)
Potassium: 3.7 mmol/L (ref 3.5–5.1)
Sodium: 142 mmol/L (ref 135–145)
Total Bilirubin: 0.5 mg/dL (ref 0.0–1.2)
Total Protein: 7.1 g/dL (ref 6.5–8.1)

## 2024-06-14 LAB — C-REACTIVE PROTEIN: CRP: 3.5 mg/dL — ABNORMAL HIGH (ref <1.0)

## 2024-06-14 LAB — LACTIC ACID, PLASMA: Lactic Acid, Venous: 1.8 mmol/L (ref 0.5–1.9)

## 2024-06-14 LAB — SEDIMENTATION RATE: Sed Rate: 65 mm/h — ABNORMAL HIGH (ref 0–16)

## 2024-06-14 MED ORDER — VANCOMYCIN HCL IN DEXTROSE 1-5 GM/200ML-% IV SOLN
1000.0000 mg | Freq: Once | INTRAVENOUS | Status: AC
  Administered 2024-06-14 (×2): 1000 mg via INTRAVENOUS
  Filled 2024-06-14: qty 200

## 2024-06-14 MED ORDER — TETANUS-DIPHTH-ACELL PERTUSSIS 5-2.5-18.5 LF-MCG/0.5 IM SUSY
0.5000 mL | PREFILLED_SYRINGE | Freq: Once | INTRAMUSCULAR | Status: AC
  Administered 2024-06-14 (×2): 0.5 mL via INTRAMUSCULAR
  Filled 2024-06-14: qty 0.5

## 2024-06-14 MED ORDER — PIPERACILLIN-TAZOBACTAM 3.375 G IVPB 30 MIN
3.3750 g | Freq: Once | INTRAVENOUS | Status: AC
  Administered 2024-06-14 (×2): 3.375 g via INTRAVENOUS
  Filled 2024-06-14: qty 50

## 2024-06-14 MED ORDER — VANCOMYCIN HCL 500 MG IV SOLR
500.0000 mg | Freq: Once | INTRAVENOUS | Status: AC
  Administered 2024-06-14 (×2): 500 mg via INTRAVENOUS

## 2024-06-14 NOTE — ED Triage Notes (Signed)
 Pt states stepped on razor blade 2 weeks ago to left foot  Not able to put weight on heel

## 2024-06-14 NOTE — Progress Notes (Signed)
 ED Pharmacy Antibiotic Sign Off An antibiotic consult was received from an ED provider for vancomycin  and zosyn  per pharmacy dosing for wound infection. A chart review was completed to assess appropriateness.   The following one time order(s) were placed:  Vancomycin  1500 mg IV x 1 Zosyn  3.375g IV x 1  Further antibiotic and/or antibiotic pharmacy consults should be ordered by the admitting provider if indicated.   Thank you for allowing pharmacy to be a part of this patient's care.   Dorn Poot, [facility]  Clinical Pharmacist 06/14/24 8:24 PM

## 2024-06-14 NOTE — ED Provider Notes (Signed)
 [place] [facility] Provider Note   CSN: [id number] Arrival date & time: 06/14/24  8161     Patient presents with: Laceration   Darius Rivers is a 63 y.o. male who has a past medical history of chronic kidney disease, coronary artery disease, hyperlipidemia, hypertension, history of prostate cancer, previous smoking thoracic aortic aneurysm and AAA presents to the emergency department for complaint of left-sided foot pain.  Patient states that he stepped on a razor blade that was on his floor 2 weeks ago.  He states that he has had progressively worsening pain especially with ambulation.  He does not have significant pain at rest.  He denies a history of diabetes.  He denies fever or chills.  Patient states that he was not wearing shoes at the time that he was cut on his foot.    Laceration      Prior to Rivers medications   Medication Sig Start Date End Date Taking? Authorizing Provider  aspirin  EC 81 MG tablet Take 81 mg by mouth daily.    [provider]  carvedilol  (COREG ) 3.125 MG tablet Take 1 tablet (3.125 mg total) by mouth 2 (two) times daily. 05/25/24 08/23/24  Rivers, Darius SAUNDERS, MD  digoxin  (LANOXIN ) 0.125 MG tablet Take 1 tablet (0.125 mg total) by mouth daily. 04/19/24   Rivers, Darius T, MD  empagliflozin  (JARDIANCE ) 10 MG TABS tablet Take 1 tablet (10 mg total) by mouth daily before breakfast. 12/23/23   Darius Beckey CROME, NP  ezetimibe  (ZETIA ) 10 MG tablet Take 1 tablet (10 mg total) by mouth daily. 04/06/24   Darius Beckey CROME, NP  feeding supplement (ENSURE PLUS HIGH PROTEIN) LIQD Take 237 mLs by mouth 2 (two) times daily between meals. 04/19/24   Rivers, Darius T, MD  Multiple Vitamin (MULTIVITAMIN WITH MINERALS) TABS tablet Take 1 tablet by mouth daily. 04/20/24   Rivers, Darius T, MD  rosuvastatin  (CRESTOR ) 40 MG tablet Take 1 tablet (40 mg total) by mouth daily at 6 PM. 12/08/22   Darius Powell BRAVO, MD  sacubitril -valsartan  (ENTRESTO )  24-26 MG Take 1 tablet by mouth 2 (two) times daily. 04/19/24   Rivers, Darius T, MD  senna-docusate (SENOKOT-S) 8.6-50 MG tablet Take 1 tablet by mouth 2 (two) times daily as needed for moderate constipation. 04/19/24   Rivers, Darius T, MD  spironolactone  (ALDACTONE ) 25 MG tablet Take 1 tablet (25 mg total) by mouth daily. 05/05/24 08/03/24  Darius Harlene HERO, FNP    Allergies: Patient has no known allergies.    Review of Systems  Updated Vital Signs BP (!) 154/101 (BP Location: Right Arm)   Pulse 91   Temp 98.8 F (37.1 C) (Oral)   Resp 14   Ht 6' 4 (1.93 m)   Wt 75.2 kg   SpO2 100%   BMI 20.18 kg/m   Physical Exam Vitals and nursing note reviewed.  Constitutional:      General: He is not in acute distress.    Appearance: He is well-developed. He is not diaphoretic.  HENT:     Head: Normocephalic and atraumatic.  Eyes:     General: No scleral icterus.    Conjunctiva/sclera: Conjunctivae normal.  Cardiovascular:     Rate and Rhythm: Normal rate and regular rhythm.     Pulses:          Dorsalis pedis pulses are detected w/ Doppler on the right side and detected w/ Doppler on the left side.  Posterior tibial pulses are 0 on the right side and detected w/ Doppler on the left side.     Heart sounds: Normal heart sounds.     Comments: Dopplers used for pedal pulses.  No detectable posterior tibialis pulse on the right, monophasic dorsalis pedis pulse detectable by auscultation on the right. Significantly louder biphasic pulses distally detected with Doppler in the left foot.  A left foot and ankle examination Pulmonary:     Effort: Pulmonary effort is normal. No respiratory distress.     Breath sounds: Normal breath sounds.  Abdominal:     Palpations: Abdomen is soft.     Tenderness: There is no abdominal tenderness.  Musculoskeletal:     Cervical back: Normal range of motion and neck supple.     Comments: I performed a left foot and ankle examination.  Pulses as documented  under cardiac exam. There is a well-healed laceration over the medial heel on the left.  It is exquisitely tender to palpation there is an extensive accumulation of purulent material under the skin of the heel extending all the way across the heel and tracking medially up the foot.  There is a palpable fluctuant region just inferior to the medial malleolus.  There is extensive swelling of the foot and ankle as along with erythema tracking up the leg.  There is no pain with movement of the left ankle joint..  Skin:    General: Skin is warm and dry.  Neurological:     Mental Status: He is alert.  Psychiatric:        Behavior: Behavior normal.              (all labs ordered are listed, but only abnormal results are displayed) Labs Reviewed  CBC WITH DIFFERENTIAL/PLATELET - Abnormal; Notable for the following components:      Result Value   RBC 3.14 (*)    Hemoglobin 9.4 (*)    HCT 29.5 (*)    RDW 17.4 (*)    Platelets 434 (*)    All other components within normal limits  CULTURE, BLOOD (ROUTINE X 2)  CULTURE, BLOOD (ROUTINE X 2)  COMPREHENSIVE METABOLIC PANEL WITH GFR  LACTIC ACID, PLASMA  SEDIMENTATION RATE  C-REACTIVE PROTEIN    EKG: None  Radiology: DG Foot Complete Left Result Date: 06/14/2024 CLINICAL DATA:  Stepped on reasonably 2 weeks ago EXAM: LEFT FOOT - COMPLETE 3+ VIEW COMPARISON:  None Available. FINDINGS: There is no acute fracture or dislocation. There is a healed distal third metatarsal fracture. There is no evidence of arthropathy or other focal bone abnormality. Soft tissues are unremarkable. No foreign body identified. IMPRESSION: 1. No acute fracture or dislocation. 2. Healed distal third metatarsal fracture. Electronically Signed   By: Darius Rivers M.D.   On: 06/14/2024 19:37     Procedures   Medications Ordered in the ED  Tdap (BOOSTRIX ) injection 0.5 mL (has no administration in time range)    Clinical Course as of 06/15/24 1440  Tue Jun 14, 2024  2027 WBC: 9.3 [AH]  2027 Hemoglobin(!): 9.4 [AH]  2028 Glucose(!): 120 [AH]  2048 Case discussed with Dr. Dozier- orthpedics - patient should be NPO after midnight- ABX- will consult tomorrow- Darius Rivers  [AH]    Clinical Course User Index [AH] Maryfer Tauzin, PA-C  Medical Decision Making 63 year old male with a history of peripheral vascular disease presents for a left foot infection after stepping on a razor blade 2 weeks ago.  Differential diagnosis includes abscess, cellulitis, osteomyelitis, necrotizing soft tissue infection.  On physical examination the patient does not have crepitus or evidence of necrotizing infection.  I ordered labs.  Patient has no elevated white blood cell count, CMP with mildly elevated glucose likely acute phase reaction in setting of infection, mildly elevated sed rate, CRP pending lactic acid within normal limits blood cultures pending   I visualized and interpreted left foot x-ray which shows no acute findings including no bony erosion suggestive of osteomyelitis and no subcutaneous emphysema   On my evaluation I have concern for purulent infection involving the entire heel with tracking up the medial aspect of the ankle.  I also feel fluctuance under the thickened heel pad and tracking medially.  Given the patient's history of vascular disease, concern for significant area of abscess infection, potential need for extensive debridement I along with my attending physician do not feel comfortable doing this at bedside in the emergency department.  I discussed the case with Dr. Dozier who will see the patient for evaluation in the hospital.  I discussed the case with Dr. Franky who will admit the patient.  Patient given IV Zosyn  and vancomycin  for coverage of infection.  Seen in shared visit with Dr. Elnor who agrees with assessment and plan for Rivers  Patient may benefit from vascular  evaluation given diminished pulses in the ankles and feet in outpatient setting.  Amount and/or Complexity of Data Reviewed Labs: ordered. Decision-making details documented in ED Course. Radiology: ordered.  Risk Prescription drug management. Decision regarding hospitalization.        Final diagnoses:  None    ED Discharge Orders     None          Arloa Chroman, PA-C 06/15/24 1444    Elnor Jayson LABOR, DO 06/28/24 [phone]

## 2024-06-15 DIAGNOSIS — L0390 Cellulitis, unspecified: Secondary | ICD-10-CM | POA: Diagnosis present

## 2024-06-15 DIAGNOSIS — I428 Other cardiomyopathies: Secondary | ICD-10-CM | POA: Diagnosis present

## 2024-06-15 DIAGNOSIS — I351 Nonrheumatic aortic (valve) insufficiency: Secondary | ICD-10-CM | POA: Diagnosis present

## 2024-06-15 DIAGNOSIS — B964 Proteus (mirabilis) (morganii) as the cause of diseases classified elsewhere: Secondary | ICD-10-CM | POA: Diagnosis present

## 2024-06-15 DIAGNOSIS — Z87891 Personal history of nicotine dependence: Secondary | ICD-10-CM | POA: Diagnosis not present

## 2024-06-15 DIAGNOSIS — D649 Anemia, unspecified: Secondary | ICD-10-CM

## 2024-06-15 DIAGNOSIS — Z23 Encounter for immunization: Secondary | ICD-10-CM | POA: Diagnosis not present

## 2024-06-15 DIAGNOSIS — Z7984 Long term (current) use of oral hypoglycemic drugs: Secondary | ICD-10-CM | POA: Diagnosis not present

## 2024-06-15 DIAGNOSIS — Z7982 Long term (current) use of aspirin: Secondary | ICD-10-CM | POA: Diagnosis not present

## 2024-06-15 DIAGNOSIS — I2510 Atherosclerotic heart disease of native coronary artery without angina pectoris: Secondary | ICD-10-CM | POA: Diagnosis present

## 2024-06-15 DIAGNOSIS — Z79899 Other long term (current) drug therapy: Secondary | ICD-10-CM | POA: Diagnosis not present

## 2024-06-15 DIAGNOSIS — N183 Chronic kidney disease, stage 3 unspecified: Secondary | ICD-10-CM | POA: Diagnosis present

## 2024-06-15 DIAGNOSIS — Z8546 Personal history of malignant neoplasm of prostate: Secondary | ICD-10-CM | POA: Diagnosis not present

## 2024-06-15 DIAGNOSIS — I5022 Chronic systolic (congestive) heart failure: Secondary | ICD-10-CM | POA: Diagnosis present

## 2024-06-15 DIAGNOSIS — I7121 Aneurysm of the ascending aorta, without rupture: Secondary | ICD-10-CM | POA: Diagnosis present

## 2024-06-15 DIAGNOSIS — D631 Anemia in chronic kidney disease: Secondary | ICD-10-CM | POA: Diagnosis present

## 2024-06-15 DIAGNOSIS — I130 Hypertensive heart and chronic kidney disease with heart failure and stage 1 through stage 4 chronic kidney disease, or unspecified chronic kidney disease: Secondary | ICD-10-CM | POA: Diagnosis present

## 2024-06-15 DIAGNOSIS — E785 Hyperlipidemia, unspecified: Secondary | ICD-10-CM | POA: Diagnosis present

## 2024-06-15 DIAGNOSIS — L03116 Cellulitis of left lower limb: Secondary | ICD-10-CM | POA: Diagnosis present

## 2024-06-15 DIAGNOSIS — Z8249 Family history of ischemic heart disease and other diseases of the circulatory system: Secondary | ICD-10-CM | POA: Diagnosis not present

## 2024-06-15 DIAGNOSIS — I252 Old myocardial infarction: Secondary | ICD-10-CM | POA: Diagnosis not present

## 2024-06-15 LAB — CBC
HCT: 28.7 % — ABNORMAL LOW (ref 39.0–52.0)
Hemoglobin: 8.9 g/dL — ABNORMAL LOW (ref 13.0–17.0)
MCH: 29.7 pg (ref 26.0–34.0)
MCHC: 31 g/dL (ref 30.0–36.0)
MCV: 95.7 fL (ref 80.0–100.0)
Platelets: 388 K/uL (ref 150–400)
RBC: 3 MIL/uL — ABNORMAL LOW (ref 4.22–5.81)
RDW: 17.3 % — ABNORMAL HIGH (ref 11.5–15.5)
WBC: 9.3 K/uL (ref 4.0–10.5)
nRBC: 0 % (ref 0.0–0.2)

## 2024-06-15 MED ORDER — ACETAMINOPHEN 650 MG RE SUPP: 650.0000 mg | Freq: Four times a day (QID) | RECTAL | Status: DC | PRN

## 2024-06-15 MED ORDER — ACETAMINOPHEN 325 MG PO TABS: 650.0000 mg | ORAL_TABLET | Freq: Four times a day (QID) | ORAL | Status: DC | PRN

## 2024-06-15 MED ORDER — SACUBITRIL-VALSARTAN 24-26 MG PO TABS
1.0000 | ORAL_TABLET | Freq: Two times a day (BID) | ORAL | Status: DC
  Administered 2024-06-15 – 2024-06-21 (×15): 1 via ORAL
  Filled 2024-06-15 (×13): qty 1

## 2024-06-15 MED ORDER — CARVEDILOL 3.125 MG PO TABS
3.1250 mg | ORAL_TABLET | Freq: Two times a day (BID) | ORAL | Status: DC
  Administered 2024-06-15 – 2024-06-21 (×15): 3.125 mg via ORAL
  Filled 2024-06-15 (×13): qty 1

## 2024-06-15 MED ORDER — EZETIMIBE 10 MG PO TABS
10.0000 mg | ORAL_TABLET | Freq: Every day | ORAL | Status: DC
  Administered 2024-06-15 – 2024-06-21 (×8): 10 mg via ORAL
  Filled 2024-06-15 (×7): qty 1

## 2024-06-15 MED ORDER — VANCOMYCIN HCL IN DEXTROSE 1-5 GM/200ML-% IV SOLN
1000.0000 mg | Freq: Two times a day (BID) | INTRAVENOUS | Status: DC
  Administered 2024-06-15 – 2024-06-21 (×15): 1000 mg via INTRAVENOUS
  Filled 2024-06-15 (×14): qty 200

## 2024-06-15 MED ORDER — EMPAGLIFLOZIN 10 MG PO TABS
10.0000 mg | ORAL_TABLET | Freq: Every day | ORAL | Status: DC
  Administered 2024-06-16 – 2024-06-21 (×6): 10 mg via ORAL
  Filled 2024-06-15 (×6): qty 1

## 2024-06-15 MED ORDER — DIGOXIN 125 MCG PO TABS
0.1250 mg | ORAL_TABLET | Freq: Every day | ORAL | Status: DC
  Administered 2024-06-15 – 2024-06-21 (×8): 0.125 mg via ORAL
  Filled 2024-06-15 (×7): qty 1

## 2024-06-15 MED ORDER — PIPERACILLIN-TAZOBACTAM 3.375 G IVPB
3.3750 g | Freq: Three times a day (TID) | INTRAVENOUS | Status: DC
  Administered 2024-06-15 – 2024-06-16 (×7): 3.375 g via INTRAVENOUS
  Filled 2024-06-15 (×4): qty 50

## 2024-06-15 MED ORDER — ASPIRIN 81 MG PO TBEC
81.0000 mg | DELAYED_RELEASE_TABLET | Freq: Every day | ORAL | Status: DC
Start: 2024-06-15 — End: 2024-06-21
  Administered 2024-06-15 – 2024-06-21 (×8): 81 mg via ORAL
  Filled 2024-06-15 (×7): qty 1

## 2024-06-15 MED ORDER — SPIRONOLACTONE 25 MG PO TABS
25.0000 mg | ORAL_TABLET | Freq: Every day | ORAL | Status: DC
  Administered 2024-06-15 – 2024-06-21 (×8): 25 mg via ORAL
  Filled 2024-06-15 (×7): qty 1

## 2024-06-15 NOTE — Progress Notes (Signed)
 Pharmacy Antibiotic Note  Darius Rivers is a 63 y.o. male admitted on 06/14/2024 with cellulitis of left foot.  Pharmacy has been consulted for Vancomycin  and Zosyn  dosing.  Plan: Zosyn  3.375g IV q8h (4 hour infusion). Vancomycin  1000 mg IV Q 12 hrs. Goal AUC 400-600. Daily SCr while on both Vancomycin  and Zosyn  F/u culture results & sensitivities   Height: 6' 4 (193 cm) Weight: 75.2 kg (165 lb 12.6 oz) IBW/kg (Calculated) : 86.8  Temp (24hrs), Avg:98.8 F (37.1 C), Min:98.5 F (36.9 C), Max:99.1 F (37.3 C)  Recent Labs  Lab 06/14/24 1915  WBC 9.3  CREATININE 1.11  LATICACIDVEN 1.8    Estimated Creatinine Clearance: 74.3 mL/min (by C-G formula based on SCr of 1.11 mg/dL).    No Known Allergies  Antimicrobials this admission: 8/12 Vancomycin  >>   8/12 Zosyn  >>    Dose adjustments this admission:    Microbiology results: 8/12 BCx:      Thank you for allowing pharmacy to be a part of this patient's care.  Kemp Arvin Fletcher, PharmD 06/15/2024 5:53 AM

## 2024-06-15 NOTE — Plan of Care (Signed)
  Problem: Clinical Measurements: Goal: Diagnostic test results will improve Outcome: Progressing   Problem: Health Behavior/Discharge Planning: Goal: Ability to manage health-related needs will improve Outcome: Progressing

## 2024-06-15 NOTE — Consult Note (Signed)
 Reason for Consult:Left foot infection Referring Physician: [name] is an 63 y.o. male.  HPI: 63 year old nondiabetic male who cut his foot on a razor blade a couple weeks ago.  He notes that recently he was on a fishing boat and had increased pain in the foot due to being bounced around.  He notes increased swelling and presented to the emergency department yesterday for evaluation.  There was concern for infection and he was admitted.  Past Medical History:  Diagnosis Date   Chronic kidney disease stage II    Coronary artery disease    NSTEMI in 11/2018 - Lg scar on Myoview ; Rx medically due to CKD // Cath 4/22: mLAD 15 (no sig CAD)   HFrEF (heart failure with reduced EF)    NICM // Echocardiogram 11/2018: EF 40-45 // Echocardiogram 4/22:  EF 25-30 // Echo 7/22: EF 40-45, global HK normal RVSF, mild LAE, mild MR, moderate AI, mild AV sclerosis without stenosis, mild dilation of aortic root (42 mm)   Hyperlipidemia    Hypertension    Lung nodule    CT 7/22: Left upper lobe 3 mm> repeat chest CT in 1 year   Nephrolithiasis    NSVT (nonsustained ventricular tachycardia)    Prostate cancer (HCC)    watchful waiting, due to go back to urology   Thoracic ascending aortic aneurysm    Echo 4/22: 44 mm // Chest CTA 7/22: Ascending thoracic aortic aneurysm 4.4 cm.  3 mm left upper lobe nodule.  Aortic atherosclerosis.    Past Surgical History:  Procedure Laterality Date   CYSTOSCOPY WITH URETEROSCOPY, STONE BASKETRY AND STENT PLACEMENT     put a stent in; pulled stent out later   INGUINAL HERNIA REPAIR Bilateral 1980s   PROSTATE BIOPSY     RIGHT HEART CATH N/A 04/18/2024   Procedure: RIGHT HEART CATH;  Surgeon: Zenaida Morene PARAS, MD;  Location: [facility] INVASIVE CV LAB;  Service: Cardiovascular;  Laterality: N/A;   RIGHT/LEFT HEART CATH AND CORONARY ANGIOGRAPHY N/A 02/06/2021   Procedure: RIGHT/LEFT HEART CATH AND CORONARY ANGIOGRAPHY;  Surgeon: Burnard Debby LABOR, MD;  Location: [facility]  INVASIVE CV LAB;  Service: Cardiovascular;  Laterality: N/A;    Family History  Problem Relation Age of Onset   High blood pressure Mother    Heart attack Neg Hx    Cancer Neg Hx    Diabetes Neg Hx     Social History:  reports that he quit smoking about 25 years ago. His smoking use included cigarettes. He started smoking about 45 years ago. He has a 6.6 pack-year smoking history. He has never used smokeless tobacco. He reports that he does not currently use alcohol. He reports current drug use. Drug: Marijuana.  Allergies: No Known Allergies  Medications: I have reviewed the patient's current medications.  Results for orders placed or performed during the hospital encounter of 06/14/24 (from the past 48 hours)  Comprehensive metabolic panel     Status: Abnormal   Collection Time: 06/14/24  7:15 PM  Result Value Ref Range   Sodium 142 135 - 145 mmol/L   Potassium 3.7 3.5 - 5.1 mmol/L   Chloride 106 98 - 111 mmol/L   CO2 24 22 - 32 mmol/L   Glucose, Bld 120 (H) 70 - 99 mg/dL    Comment: Glucose reference range applies only to samples taken after fasting for at least 8 hours.   BUN 10 8 - 23 mg/dL   Creatinine, Ser 8.88 0.61 -  1.24 mg/dL   Calcium  9.3 8.9 - 10.3 mg/dL   Total Protein 7.1 6.5 - 8.1 g/dL   Albumin  3.6 3.5 - 5.0 g/dL   AST 16 15 - 41 U/L   ALT <5 0 - 44 U/L   Alkaline Phosphatase 60 38 - 126 U/L   Total Bilirubin 0.5 0.0 - 1.2 mg/dL   GFR, Estimated >39 >39 mL/min    Comment: (NOTE) Calculated using the CKD-EPI Creatinine Equation (2021)    Anion gap 11 5 - 15    Comment: Performed at [facility], [address] [place], [place] [address]  CBC with Differential     Status: Abnormal   Collection Time: 06/14/24  7:15 PM  Result Value Ref Range   WBC 9.3 4.0 - 10.5 K/uL   RBC 3.14 (L) 4.22 - 5.81 MIL/uL   Hemoglobin 9.4 (L) 13.0 - 17.0 g/dL   HCT 70.4 (L) 60.9 - 47.9 %   MCV 93.9 80.0 - 100.0 fL   MCH 29.9 26.0 - 34.0 pg   MCHC 31.9 30.0 - 36.0  g/dL   RDW 82.5 (H) 88.4 - 84.4 %   Platelets 434 (H) 150 - 400 K/uL   nRBC 0.0 0.0 - 0.2 %   Neutrophils Relative % 72 %   Neutro Abs 6.6 1.7 - 7.7 K/uL   Lymphocytes Relative 21 %   Lymphs Abs 2.0 0.7 - 4.0 K/uL   Monocytes Relative 5 %   Monocytes Absolute 0.5 0.1 - 1.0 K/uL   Eosinophils Relative 2 %   Eosinophils Absolute 0.2 0.0 - 0.5 K/uL   Basophils Relative 0 %   Basophils Absolute 0.0 0.0 - 0.1 K/uL   Immature Granulocytes 0 %   Abs Immature Granulocytes 0.02 0.00 - 0.07 K/uL    Comment: Performed at [facility], [address] [place], [place] [address]  Lactic acid     Status: None   Collection Time: 06/14/24  7:15 PM  Result Value Ref Range   Lactic Acid, Venous 1.8 0.5 - 1.9 mmol/L    Comment: Performed at [facility], [address] [place], [place] [address]  Blood Cultures x 2 sites     Status: None (Preliminary result)   Collection Time: 06/14/24  7:15 PM   Specimen: BLOOD  Result Value Ref Range   Specimen Description      BLOOD RIGHT ANTECUBITAL Performed at [facility], [address] [place], [place] [address]    Special Requests      BOTTLES DRAWN AEROBIC AND ANAEROBIC Blood Culture adequate volume Performed at [facility], [address] [place], [place] [address]    Culture      NO GROWTH < 12 HOURS Performed at [facility], [address], [place], [place] [address]    Report Status PENDING   Blood Cultures x 2 sites     Status: None (Preliminary result)   Collection Time: 06/14/24  7:20 PM   Specimen: BLOOD  Result Value Ref Range   Specimen Description      BLOOD LEFT ANTECUBITAL Performed at [facility], [address] [place], [place] [address]    Special Requests      BOTTLES DRAWN AEROBIC ONLY Blood Culture adequate volume Performed at [facility], [address] [place], [place] [address]    Culture      NO GROWTH <  12 HOURS Performed at [facility], [address], [place], [place] [address]    Report Status PENDING   C-reactive protein     Status: Abnormal   Collection Time: 06/14/24  7:45 PM  Result Value Ref Range   CRP 3.5 (H) <1.0 mg/dL    Comment: Performed at [facility], [address] [place], [place] [address]  Sedimentation rate     Status: Abnormal   Collection Time: 06/14/24  7:50 PM  Result Value Ref Range   Sed Rate 65 (H) 0 - 16 mm/hr    Comment: Performed at [facility], [address] [place], [place] [address]  CBC     Status: Abnormal   Collection Time: 06/15/24  7:04 AM  Result Value Ref Range   WBC 9.3 4.0 - 10.5 K/uL   RBC 3.00 (L) 4.22 - 5.81 MIL/uL   Hemoglobin 8.9 (L) 13.0 - 17.0 g/dL   HCT 71.2 (L) 60.9 - 47.9 %   MCV 95.7 80.0 - 100.0 fL   MCH 29.7 26.0 - 34.0 pg   MCHC 31.0 30.0 - 36.0 g/dL   RDW 82.6 (H) 88.4 - 84.4 %   Platelets 388 150 - 400 K/uL   nRBC 0.0 0.0 - 0.2 %    Comment: Performed at [facility], [address] [place], [place] [address]    DG Foot Complete Left Result Date: 06/14/2024 CLINICAL DATA:  Stepped on reasonably 2 weeks ago EXAM: LEFT FOOT - COMPLETE 3+ VIEW COMPARISON:  None Available. FINDINGS: There is no acute fracture or dislocation. There is a healed distal third metatarsal fracture. There is no evidence of arthropathy or other focal bone abnormality. Soft tissues are unremarkable. No foreign body identified. IMPRESSION: 1. No acute fracture or dislocation. 2. Healed distal third metatarsal fracture. Electronically Signed   By: Greig Pique M.D.   On: 06/14/2024 19:37    Review of Systems  All other systems reviewed and are negative.  Blood pressure (!) 161/90, pulse 72, temperature 99.4 F (37.4 C), resp. rate 17, height 6' 4 (1.93 m), weight 75.2 kg, SpO2 100%. Physical Exam Constitutional:      Appearance: He is well-developed.  HENT:     Head: Atraumatic.  Eyes:     Extraocular Movements: Extraocular  movements intact.  Cardiovascular:     Pulses: Normal pulses.  Pulmonary:     Effort: Pulmonary effort is normal.  Musculoskeletal:     Comments: Examination of the left foot shows some diffuse mild swelling over the posterior And inferior heel.  Skin is thickened.  There is small superficial layer of fluctuance beneath the skin.  There is some tenderness here as well.  No significant ascending cellulitis  Skin:    General: Skin is warm and dry.  Neurological:     Mental Status: He is alert and oriented to person, place, and time.  Psychiatric:        Mood and Affect: Mood normal.     Assessment/Plan: Left foot infection There does appear to be some underlying fluid, but I do not believe there is a large deep abscess.  I believe this is something that may or may not respond to IV antibiotics.  If it does not, it will need to be opened and formally debrided.  For now I would recommend IV antibiotics and observation over the next 24 hours.  We will reassess tomorrow and if things are not improving  we can proceed with formal irrigation and debridement.  He can eat today but should be n.p.o. after midnight again.  Darius Rivers 06/15/2024, 7:58 AM

## 2024-06-15 NOTE — H&P (Signed)
 History and Physical    Darius Rivers FMW:[id number] DOB: 12-08-1960 DOA: 06/14/2024  PCP: No primary care provider on file.  Patient coming from: [facility] ED  Chief Complaint: Left foot swelling and pain  HPI: Darius Rivers is a 63 y.o. male with medical history significant of CAD, HFrEF, hypertension, hyperlipidemia, CKD stage II-IIIa, NSVT/PVCs, aortic valve insufficiency, prostate cancer, thoracic ascending aortic aneurysm, marijuana abuse.  Last admitted to the hospital 6/12-6/17/2025 for CHF exacerbation and AKI.  Patient presents at this time for evaluation of left foot swelling and pain in the setting of accidentally stepping on a razor blade on the floor 2 weeks ago as he was walking barefoot.  Patient states he had a cut on his left heel which initially healed but then he went on a fishing trip this past weekend and afterwards started having worsening foot pain and swelling.  He has not noticed any drainage from the wound.  Denies fevers or chills.  Denies chest pain or shortness of breath.  No other complaints.  Denies nausea, vomiting, abdominal pain, or diarrhea.    ED Course: Slightly hypertensive on arrival but remainder of vital signs stable.  Labs showing no leukocytosis, hemoglobin 9.4 (baseline 11-13), MCV 93.9, platelet count 434k, creatinine 1.1 (stable), lactic acid normal, blood cultures in process, ESR 65, CRP 3.5.  Pedal pulses detectable on Doppler.  X-ray of left foot does not show definite osteomyelitis.  Changes of cellulitis noted but also area of fluctuance concerning for deep abscess.  EDP discussed with Dr. Dozier on-call for orthopedics who requested admitting the patient to [facility] and keeping n.p.o. after midnight in anticipation of possible wound debridement in the morning.  Patient was given Tdap injection, vancomycin , and Zosyn .  Review of Systems:  Review of Systems  All other systems reviewed and are negative.   Past Medical History:   Diagnosis Date   Chronic kidney disease stage II    Coronary artery disease    NSTEMI in 11/2018 - Lg scar on Myoview ; Rx medically due to CKD // Cath 4/22: mLAD 15 (no sig CAD)   HFrEF (heart failure with reduced EF)    NICM // Echocardiogram 11/2018: EF 40-45 // Echocardiogram 4/22:  EF 25-30 // Echo 7/22: EF 40-45, global HK normal RVSF, mild LAE, mild MR, moderate AI, mild AV sclerosis without stenosis, mild dilation of aortic root (42 mm)   Hyperlipidemia    Hypertension    Lung nodule    CT 7/22: Left upper lobe 3 mm> repeat chest CT in 1 year   Nephrolithiasis    NSVT (nonsustained ventricular tachycardia)    Prostate cancer (HCC)    watchful waiting, due to go back to urology   Thoracic ascending aortic aneurysm    Echo 4/22: 44 mm // Chest CTA 7/22: Ascending thoracic aortic aneurysm 4.4 cm.  3 mm left upper lobe nodule.  Aortic atherosclerosis.    Past Surgical History:  Procedure Laterality Date   CYSTOSCOPY WITH URETEROSCOPY, STONE BASKETRY AND STENT PLACEMENT     put a stent in; pulled stent out later   INGUINAL HERNIA REPAIR Bilateral 1980s   PROSTATE BIOPSY     RIGHT HEART CATH N/A 04/18/2024   Procedure: RIGHT HEART CATH;  Surgeon: Zenaida Morene PARAS, MD;  Location: [facility] INVASIVE CV LAB;  Service: Cardiovascular;  Laterality: N/A;   RIGHT/LEFT HEART CATH AND CORONARY ANGIOGRAPHY N/A 02/06/2021   Procedure: RIGHT/LEFT HEART CATH AND CORONARY ANGIOGRAPHY;  Surgeon: Burnard Debby LABOR, MD;  Location: [facility] INVASIVE CV LAB;  Service: Cardiovascular;  Laterality: N/A;     reports that he quit smoking about 25 years ago. His smoking use included cigarettes. He started smoking about 45 years ago. He has a 6.6 pack-year smoking history. He has never used smokeless tobacco. He reports that he does not currently use alcohol. He reports current drug use. Drug: Marijuana.  No Known Allergies  Family History  Problem Relation Age of Onset   High blood pressure Mother    Heart attack  Neg Hx    Cancer Neg Hx    Diabetes Neg Hx     Prior to Admission medications   Medication Sig Start Date End Date Taking? Authorizing Provider  aspirin  EC 81 MG tablet Take 81 mg by mouth daily.    [provider]  carvedilol  (COREG ) 3.125 MG tablet Take 1 tablet (3.125 mg total) by mouth 2 (two) times daily. 05/25/24 08/23/24  Bensimhon, Toribio SAUNDERS, MD  digoxin  (LANOXIN ) 0.125 MG tablet Take 1 tablet (0.125 mg total) by mouth daily. 04/19/24   Gonfa, Taye T, MD  empagliflozin  (JARDIANCE ) 10 MG TABS tablet Take 1 tablet (10 mg total) by mouth daily before breakfast. 12/23/23   Hayes Beckey CROME, NP  ezetimibe  (ZETIA ) 10 MG tablet Take 1 tablet (10 mg total) by mouth daily. 04/06/24   Hayes Beckey CROME, NP  feeding supplement (ENSURE PLUS HIGH PROTEIN) LIQD Take 237 mLs by mouth 2 (two) times daily between meals. 04/19/24   Gonfa, Taye T, MD  Multiple Vitamin (MULTIVITAMIN WITH MINERALS) TABS tablet Take 1 tablet by mouth daily. 04/20/24   Gonfa, Taye T, MD  rosuvastatin  (CRESTOR ) 40 MG tablet Take 1 tablet (40 mg total) by mouth daily at 6 PM. 12/08/22   Hobart Powell BRAVO, MD  sacubitril -valsartan  (ENTRESTO ) 24-26 MG Take 1 tablet by mouth 2 (two) times daily. 04/19/24   Gonfa, Taye T, MD  senna-docusate (SENOKOT-S) 8.6-50 MG tablet Take 1 tablet by mouth 2 (two) times daily as needed for moderate constipation. 04/19/24   Gonfa, Taye T, MD  spironolactone  (ALDACTONE ) 25 MG tablet Take 1 tablet (25 mg total) by mouth daily. 05/05/24 08/03/24  Glena Harlene HERO, FNP    Physical Exam: Vitals:   06/14/24 2203 06/14/24 2300 06/14/24 2347 06/15/24 0453  BP:  (!) 145/100 (!) 148/93 (!) 143/81  Pulse:  71 70 75  Resp:  18 18 20   Temp: 98.5 F (36.9 C) 98.5 F (36.9 C) 99 F (37.2 C) 99.1 F (37.3 C)  TempSrc: Oral Oral    SpO2:  98% 100% 100%  Weight:      Height:        Physical Exam Vitals reviewed.  Constitutional:      General: He is not in acute distress. HENT:     Head: Normocephalic and  atraumatic.  Eyes:     Extraocular Movements: Extraocular movements intact.  Cardiovascular:     Rate and Rhythm: Normal rate and regular rhythm.     Heart sounds: Murmur heard.  Pulmonary:     Effort: Pulmonary effort is normal. No respiratory distress.     Breath sounds: Normal breath sounds. No wheezing, rhonchi or rales.  Abdominal:     General: Bowel sounds are normal. There is no distension.     Palpations: Abdomen is soft.     Tenderness: There is no abdominal tenderness. There is no guarding.  Musculoskeletal:     Cervical back: Normal range of motion.  Comments: Left foot/heel wound with erythema, warmth, and area of fluctuance.  No active drainage.  Skin:    General: Skin is warm and dry.  Neurological:     General: No focal deficit present.     Mental Status: He is alert and oriented to person, place, and time.         Labs on Admission: I have personally reviewed following labs and imaging studies  CBC: Recent Labs  Lab 06/14/24 1915  WBC 9.3  NEUTROABS 6.6  HGB 9.4*  HCT 29.5*  MCV 93.9  PLT 434*   Basic Metabolic Panel: Recent Labs  Lab 06/14/24 1915  NA 142  K 3.7  CL 106  CO2 24  GLUCOSE 120*  BUN 10  CREATININE 1.11  CALCIUM  9.3   GFR: Estimated Creatinine Clearance: 74.3 mL/min (by C-G formula based on SCr of 1.11 mg/dL). Liver Function Tests: Recent Labs  Lab 06/14/24 1915  AST 16  ALT <5  ALKPHOS 60  BILITOT 0.5  PROT 7.1  ALBUMIN  3.6   No results for input(s): LIPASE, AMYLASE in the last 168 hours. No results for input(s): AMMONIA in the last 168 hours. Coagulation Profile: No results for input(s): INR, PROTIME in the last 168 hours. Cardiac Enzymes: No results for input(s): CKTOTAL, CKMB, CKMBINDEX, TROPONINI in the last 168 hours. BNP (last 3 results) Recent Labs    04/14/24 1018  PROBNP 5,937.0*   HbA1C: No results for input(s): HGBA1C in the last 72 hours. CBG: No results for input(s):  GLUCAP in the last 168 hours. Lipid Profile: No results for input(s): CHOL, HDL, LDLCALC, TRIG, CHOLHDL, LDLDIRECT in the last 72 hours. Thyroid  Function Tests: No results for input(s): TSH, T4TOTAL, FREET4, T3FREE, THYROIDAB in the last 72 hours. Anemia Panel: No results for input(s): VITAMINB12, FOLATE, FERRITIN, TIBC, IRON, RETICCTPCT in the last 72 hours. Urine analysis:    Component Value Date/Time   COLORURINE YELLOW 02/04/2021 0010   APPEARANCEUR CLEAR 02/04/2021 0010   LABSPEC 1.025 02/04/2021 0010   PHURINE 6.0 02/04/2021 0010   GLUCOSEU NEGATIVE 02/04/2021 0010   HGBUR MODERATE (A) 02/04/2021 0010   BILIRUBINUR NEGATIVE 02/04/2021 0010   KETONESUR NEGATIVE 02/04/2021 0010   PROTEINUR NEGATIVE 02/04/2021 0010   NITRITE NEGATIVE 02/04/2021 0010   LEUKOCYTESUR NEGATIVE 02/04/2021 0010    Radiological Exams on Admission: DG Foot Complete Left Result Date: 06/14/2024 CLINICAL DATA:  Stepped on reasonably 2 weeks ago EXAM: LEFT FOOT - COMPLETE 3+ VIEW COMPARISON:  None Available. FINDINGS: There is no acute fracture or dislocation. There is a healed distal third metatarsal fracture. There is no evidence of arthropathy or other focal bone abnormality. Soft tissues are unremarkable. No foreign body identified. IMPRESSION: 1. No acute fracture or dislocation. 2. Healed distal third metatarsal fracture. Electronically Signed   By: Greig Pique M.D.   On: 06/14/2024 19:37    EKG: Pending at this time.  Assessment and Plan  Left foot cellulitis with possible abscess In the setting of accidentally stepping on a blade 2 weeks ago.  No fever, tachycardia, leukocytosis, lactic acidosis.  No signs of sepsis.  No history of diabetes (A1c 5.5 on 04/14/2024).  Pedal pulses detectable on Doppler in the ED.  ESR and CRP elevated, however, x-ray does not show definite osteomyelitis.  Changes of foot cellulitis noted but also there is an area of fluctuance  concerning for deep abscess.  Orthopedics has been consulted.  Keep n.p.o. after midnight in anticipation of possible wound debridement in the  morning.  Patient was given Tdap injection.  Continue vancomycin , Zosyn , and pain management.  Follow-up blood cultures.  Chronic normocytic anemia Hemoglobin 9.4 (baseline 11-13).  No bleeding from his foot and patient is not endorsing any symptoms of GI bleed or any other bleeding.  Monitor CBC.  Chronic HFrEF Last echo done 04/15/2024 showing EF <20%, grade 1 diastolic dysfunction, mild mitral regurgitation, moderate aortic regurgitation. No signs of volume overload at this time.  Continue home meds after pharmacy med rec is done.  CKD stage II-IIIa Renal function stable.  CAD Not endorsing any anginal symptoms.  EKG pending.  Continue home meds after pharmacy med rec is done.  Hypertension: SBP in the 140s. Hyperlipidemia History of NSVT/PVCs Pharmacy med rec pending.  DVT prophylaxis: SCDs Code Status: Full Code (discussed with the patient) Family Communication: No family available at this time. Consults called: Orthopedics Level of care: Med-Surg Admission status: It is my clinical opinion that referral for OBSERVATION is reasonable and necessary in this patient based on the above information provided. The aforementioned taken together are felt to place the patient at high risk for further clinical deterioration. However, it is anticipated that the patient may be medically stable for discharge from the hospital within 24 to 48 hours.  Editha Ram MD [facility]  If 7PM-7AM, please contact night-coverage [email or web address]  06/15/2024, 5:10 AM

## 2024-06-15 NOTE — Plan of Care (Signed)
  Problem: Pain Managment: Goal: General experience of comfort will improve and/or be controlled Outcome: Progressing   Problem: Safety: Goal: Ability to remain free from injury will improve Outcome: Progressing   Problem: Skin Integrity: Goal: Risk for impaired skin integrity will decrease Outcome: Progressing   Problem: Skin Integrity: Goal: Skin integrity will improve Outcome: Progressing

## 2024-06-15 NOTE — Progress Notes (Signed)
 [facility] Progress Note  Patient: Darius Rivers     FMW:[id number]  DOA: 06/14/2024   PCP: No primary care provider on file.       Brief hospital course: Darius Rivers is a 63 y.o. male with medical history significant of CAD, HFrEF, hypertension, hyperlipidemia, CKD stage II-IIIa, NSVT/PVCs, aortic valve insufficiency, prostate cancer, thoracic ascending aortic aneurysm, marijuana abuse.  Last admitted to the hospital 6/12-6/17/2025 for CHF exacerbation and AKI.  Patient presents at this time for evaluation of left foot swelling and pain in the setting of accidentally stepping on a razor blade on the floor 2 weeks ago as he was walking barefoot.  Patient states he had a cut on his left heel which initially healed but then he went on a fishing trip this past weekend.  He states that water was roughened but was unstable and he got rocked around the foot quite a bit.  He feels that since then, his heel has been more painful and swollen.  Subjective:  Mild pain in left heel.  Assessment and Plan: Principal Problem:   Cellulitis left heel - Continue Zosyn  and vancomycin  - Ortho consult requested-recommended conservative management for today-n.p.o. after midnight in case I&D needs to be done  Active Problems:   HFrEF (heart failure with reduced ejection fraction) - EF less than 20%, grade 1 diastolic dysfunction, moderate AI -Continue digoxin , empagliflozin , sacubitril -valsartan , spironolactone , carvedilol   Normocytic anemia    Code Status: Full Code Total time on patient care: 35 minutes DVT prophylaxis:  SCDs Start: 06/15/24 0543     Objective:   Vitals:   06/14/24 2347 06/15/24 0453 06/15/24 0744 06/15/24 1139  BP: (!) 148/93 (!) 143/81 (!) 161/90 (!) 152/90  Pulse: 70 75 72 68  Resp: 18 20 17 16   Temp: 99 F (37.2 C) 99.1 F (37.3 C) 99.4 F (37.4 C) 98.4 F (36.9 C)  TempSrc:      SpO2: 100% 100% 100% 100%  Weight:      Height:       Filed Weights    06/14/24 1847  Weight: 75.2 kg   Exam: General exam: Appears comfortable  HEENT: oral mucosa moist Respiratory system: Clear to auscultation.  Cardiovascular system: S1 & S2 heard  Gastrointestinal system: Abdomen soft, non-tender, nondistended. Normal bowel sounds   Extremities: No cyanosis, clubbing or edema-discoloration of left heel with healing wound noted Psychiatry:  Mood & affect appropriate.      CBC: Recent Labs  Lab 06/14/24 1915 06/15/24 0704  WBC 9.3 9.3  NEUTROABS 6.6  --   HGB 9.4* 8.9*  HCT 29.5* 28.7*  MCV 93.9 95.7  PLT 434* 388   Basic Metabolic Panel: Recent Labs  Lab 06/14/24 1915  NA 142  K 3.7  CL 106  CO2 24  GLUCOSE 120*  BUN 10  CREATININE 1.11  CALCIUM  9.3     Scheduled Meds:  aspirin  EC  81 mg Oral Daily   carvedilol   3.125 mg Oral BID   digoxin   0.125 mg Oral Daily   [START ON 06/16/2024] empagliflozin   10 mg Oral QAC breakfast   ezetimibe   10 mg Oral Daily   sacubitril -valsartan   1 tablet Oral BID   spironolactone   25 mg Oral Daily    Imaging and lab data personally reviewed   Author: Gwendloyn Forsee  06/15/2024 6:41 PM  To contact [facility]>   Check the care team in [facility] and look for the attending/consulting [facility] provider listed  Log into [email or web address] and  use [place]'s universal password   Go to> [facility]  and find provider  If you still have difficulty reaching the provider, please page the [facility] (Director on Call) for the Hospitalists listed on amion

## 2024-06-16 ENCOUNTER — Other Ambulatory Visit (HOSPITAL_COMMUNITY)

## 2024-06-16 DIAGNOSIS — L0390 Cellulitis, unspecified: Secondary | ICD-10-CM | POA: Diagnosis not present

## 2024-06-16 LAB — CREATININE, SERUM
Creatinine, Ser: 1.11 mg/dL (ref 0.61–1.24)
GFR, Estimated: >60 mL/min (ref >60)

## 2024-06-16 MED ORDER — SODIUM CHLORIDE 0.9 % IV SOLN
3.0000 g | Freq: Four times a day (QID) | INTRAVENOUS | Status: DC
  Administered 2024-06-16 – 2024-06-21 (×20): 3 g via INTRAVENOUS
  Filled 2024-06-16 (×22): qty 8

## 2024-06-16 NOTE — Plan of Care (Signed)
   Problem: Education: Goal: Knowledge of General Education information will improve Description Including pain rating scale, medication(s)/side effects and non-pharmacologic comfort measures Outcome: Progressing   Problem: Clinical Measurements: Goal: Ability to maintain clinical measurements within normal limits will improve Outcome: Progressing   Problem: Clinical Measurements: Goal: Will remain free from infection Outcome: Progressing

## 2024-06-16 NOTE — Progress Notes (Signed)
 [facility] Progress Note  Patient: Darius Rivers     FMW:[id number]  DOA: 06/14/2024   PCP: No primary care provider on file.       Brief hospital course: Darius Rivers is a 63 y.o. male with medical history significant of CAD, HFrEF, hypertension, hyperlipidemia, NSVT/PVCs, aortic valve insufficiency, prostate cancer, thoracic ascending aortic aneurysm, marijuana abuse.  Last admitted to the hospital 6/12-6/17/2025 for CHF exacerbation and AKI.  Patient presents at this time for evaluation of left foot swelling and pain in the setting of accidentally stepping on a razor blade on the floor 2 weeks ago as he was walking barefoot.  Patient states he had a cut on his left heel which initially healed but then he went on a fishing trip this past weekend.  He states that water was roughened but was unstable and he got rocked around the foot quite a bit.  He feels that since then, his heel has been more painful and swollen.  Subjective:  No complaints.  Assessment and Plan: Principal Problem:   Cellulitis left heel - ESR 65, CRP 3.5 - Unasyn  & vancomycin  - Ortho consult requested-bedside I and D performed today and about 10 cc of purulent material noted  Active Problems:   HFrEF (heart failure with reduced ejection fraction) - EF less than 20%, grade 1 diastolic dysfunction, moderate AI -Continue digoxin , empagliflozin , sacubitril -valsartan , spironolactone , carvedilol   Normocytic anemia    Code Status: Full Code Total time on patient care: 35 minutes DVT prophylaxis:  SCDs Start: 06/15/24 0543     Objective:   Vitals:   06/15/24 2057 06/16/24 0453 06/16/24 0943 06/16/24 1412  BP: (!) 158/100 (!) 148/90 (!) 168/92 (!) 149/103  Pulse: 69 69 71 81  Resp: 20 17  16   Temp: 99 F (37.2 C) 98.3 F (36.8 C)  98.7 F (37.1 C)  TempSrc: Oral     SpO2:    100%  Weight:      Height:       Filed Weights   06/14/24 1847  Weight: 75.2 kg   Exam: General exam: Appears  comfortable  HEENT: oral mucosa moist Respiratory system: Clear to auscultation.  Cardiovascular system: S1 & S2 heard  Gastrointestinal system: Abdomen soft, non-tender, nondistended. Normal bowel sounds   Extremities: dressing and ACE wrap on left LE not opened Psychiatry:  Mood & affect appropriate.      CBC: Recent Labs  Lab 06/14/24 1915 06/15/24 0704  WBC 9.3 9.3  NEUTROABS 6.6  --   HGB 9.4* 8.9*  HCT 29.5* 28.7*  MCV 93.9 95.7  PLT 434* 388   Basic Metabolic Panel: Recent Labs  Lab 06/14/24 1915 06/16/24 0458  NA 142  --   K 3.7  --   CL 106  --   CO2 24  --   GLUCOSE 120*  --   BUN 10  --   CREATININE 1.11 1.11  CALCIUM  9.3  --      Scheduled Meds:  aspirin  EC  81 mg Oral Daily   carvedilol   3.125 mg Oral BID   digoxin   0.125 mg Oral Daily   empagliflozin   10 mg Oral QAC breakfast   ezetimibe   10 mg Oral Daily   sacubitril -valsartan   1 tablet Oral BID   spironolactone   25 mg Oral Daily    Imaging and lab data personally reviewed   Author: Kiersten Coss  06/16/2024 7:11 PM  To contact [facility]>   Check the care team in [facility]  and look for the attending/consulting [facility] provider listed  Log into [email or web address] and use [place]'s universal password   Go to> [facility]  and find provider  If you still have difficulty reaching the provider, please page the [facility] (Director on Call) for the Hospitalists listed on amion

## 2024-06-16 NOTE — Progress Notes (Signed)
   PATIENT ID: Darius Rivers       Subjective: The foot is feeling better after 24 hours of IV antibiotics.  Objective:  Vitals:   06/16/24 0453 06/16/24 0943  BP: (!) 148/90 (!) 168/92  Pulse: 69 71  Resp: 17   Temp: 98.3 F (36.8 C)   SpO2:       Examination the left foot today shows improved swelling and erythema.  Less tenderness.  He still has areas of fluctuance along the inferior heel and a small area on the medial aspect of the calcaneus.  Labs:  Recent Labs    06/14/24 1915 06/15/24 0704  HGB 9.4* 8.9*   Recent Labs    06/14/24 1915 06/15/24 0704  WBC 9.3 9.3  RBC 3.14* 3.00*  HCT 29.5* 28.7*  PLT 434* 388   Recent Labs    06/14/24 1915 06/16/24 0458  NA 142  --   K 3.7  --   CL 106  --   CO2 24  --   BUN 10  --   CREATININE 1.11 1.11  GLUCOSE 120*  --   CALCIUM  9.3  --     Assessment and Plan: He is clinically improved but still has fluctuance We discussed proceeding with bedside I&D and he wanted to go ahead with that.  Procedure: The left foot was prepped with Betadine.  A 10 blade was used to make a small approximately 1 cm incision over the inferior heel through the thickened skin in the area of his previous injury.  Approximately 10 cc of purulence came out of the wound.  I was able to milk out a bit more and then the small area medially which did not appear directly connected was also opened sharply with 5 to 6 mm opening.  I was able to milk out a bit more purulence from here.  He tolerated this well.  We used a syringe with some saline to irrigate the cavities and the wounds were then dressed with 4 x 4's and an Ace bandage.  He tolerated the procedure well.  I believe that now that the areas are decompressed of pus he will likely do well with continuing antibiotics.  If he has reaccumulation of pus or fails to respond to continued antibiotics he may need a trip to the operating room, but I am optimistic that he will do well with this.  We  will continue to follow along.

## 2024-06-16 NOTE — Plan of Care (Signed)
   Problem: Clinical Measurements: Goal: Respiratory complications will improve Outcome: Progressing   Problem: Clinical Measurements: Goal: Diagnostic test results will improve Outcome: Progressing

## 2024-06-17 DIAGNOSIS — L0390 Cellulitis, unspecified: Secondary | ICD-10-CM | POA: Diagnosis not present

## 2024-06-17 NOTE — Progress Notes (Signed)
 Dr Dozier saw patient and was able to decompress a few more cc's of purulent discharge from the heel. Will continue to monitor. If not clearly improved by Sunday, will plan for I&D Sunday. Patient will be NPO after midnight Sunday.   [employer], PA-C

## 2024-06-17 NOTE — Plan of Care (Signed)
  Problem: Clinical Measurements: Goal: Ability to maintain clinical measurements within normal limits will improve Outcome: Progressing Goal: Diagnostic test results will improve Outcome: Progressing Goal: Respiratory complications will improve Outcome: Progressing Goal: Cardiovascular complication will be avoided Outcome: Progressing   Problem: Pain Managment: Goal: General experience of comfort will improve and/or be controlled Outcome: Progressing   Problem: Safety: Goal: Ability to remain free from injury will improve Outcome: Progressing   Problem: Skin Integrity: Goal: Risk for impaired skin integrity will decrease Outcome: Progressing   Problem: Clinical Measurements: Goal: Ability to avoid or minimize complications of infection will improve Outcome: Progressing

## 2024-06-17 NOTE — TOC Initial Note (Signed)
 Transition of Care [facility]) - Initial/Assessment Note   Patient Details  Name: Darius Rivers MRN: [id number] Date of Birth: 24-Jun-1961  Transition of [facility]) CM/SW Contact:    Duwaine GORMAN Aran, LCSW Phone Number: 06/17/2024, 9:32 AM  Clinical Narrative: Patient is from home. Patient is currently on IV antibiotics. Care management following for possible discharge needs.  Expected Discharge Plan: Home/Self Care Barriers to Discharge: Continued Medical Work up  Expected Discharge Plan and Services In-house Referral: Clinical Social Work Living arrangements for the past 2 months: Apartment              DME Arranged: N/A DME Agency: NA  Prior Living Arrangements/Services Living arrangements for the past 2 months: Apartment Lives with:: Self Patient language and need for interpreter reviewed:: Yes Do you feel safe going back to the place where you live?: Yes      Need for Family Participation in Patient Care: No (Comment) Care giver support system in place?: Yes (comment) Criminal Activity/Legal Involvement Pertinent to Current Situation/Hospitalization: No - Comment as needed  Activities of Daily Living ADL Screening (condition at time of admission) Independently performs ADLs?: Yes (appropriate for developmental age) Is the patient deaf or have difficulty hearing?: No Does the patient have difficulty seeing, even when wearing glasses/contacts?: No Does the patient have difficulty concentrating, remembering, or making decisions?: No  Emotional Assessment Alcohol / Substance Use: Not Applicable Psych Involvement: No (comment)  Admission diagnosis:  Cellulitis [L03.90] Left foot infection [L08.9] Patient Active Problem List   Diagnosis Date Noted   Normocytic anemia 06/15/2024   Cellulitis 06/14/2024   Protein-calorie malnutrition, severe 04/16/2024   Respiratory distress 04/14/2024   Shortness of breath 04/14/2024   Chronic systolic heart failure (HCC) 04/14/2024    Hypertensive urgency 04/14/2024   NICM (nonischemic cardiomyopathy) (HCC) 04/14/2024   Elevated troponin 04/14/2024   Heart failure (HCC) 12/14/2023   CHF (congestive heart failure) (HCC) 12/14/2023   CAD (coronary artery disease) 12/03/2023   Moderate aortic insufficiency 11/25/2022   Thoracic ascending aortic aneurysm 05/24/2021   Lung nodule 05/24/2021   Essential hypertension 02/04/2021   AAA (abdominal aortic aneurysm) (HCC) 02/04/2021   H/O prostate cancer 02/04/2021   Prolonged QT interval 02/03/2021   NSVT (nonsustained ventricular tachycardia) (HCC) 11/14/2018   AKI (acute kidney injury) (HCC) 11/11/2018   Troponin I above reference range 11/11/2018   Hypotension 11/11/2018   Dizziness 11/11/2018   Hyperlipidemia 11/11/2018   HFrEF (heart failure with reduced ejection fraction) (HCC) 11/11/2018   CKD (chronic kidney disease), stage III (HCC) 11/11/2018   Hyponatremia 11/11/2018   Hypokalemia 11/11/2018   Lactic acid increased 11/11/2018   Bilateral nephrolithiasis 11/11/2018   PCP:  No primary care provider on file. Pharmacy:   [facility] [id number] - [place], [place] - [address] [place] [address] Phone: [phone] Fax: [phone]  [facility] [address] [place] [address] Phone: [phone] Fax: [phone]  Social Drivers of Health (SDOH) Social History: SDOH Screenings   Food Insecurity: No Food Insecurity (06/14/2024)  Housing: Low Risk  (06/14/2024)  Transportation Needs: No Transportation Needs (06/14/2024)  Utilities: Not At Risk (06/14/2024)  Alcohol Screen: Low Risk  (04/15/2024)  Financial Resource Strain: Low Risk  (04/15/2024)  Social Connections: Socially Isolated (04/14/2024)  Tobacco Use: Medium Risk (06/14/2024)   SDOH Interventions:    Readmission Risk Interventions     No data to display

## 2024-06-17 NOTE — Progress Notes (Signed)
 [facility] Progress Note  Patient: Darius Rivers     FMW:[id number]  DOA: 06/14/2024   PCP: No primary care provider on file.       Brief hospital course: Arsal Tappan is a 63 y.o. male with medical history significant of CAD, HFrEF, hypertension, hyperlipidemia, NSVT/PVCs, aortic valve insufficiency, prostate cancer, thoracic ascending aortic aneurysm, marijuana abuse.  Last admitted to the hospital 6/12-6/17/2025 for CHF exacerbation and AKI.  Patient presents at this time for evaluation of left foot swelling and pain in the setting of accidentally stepping on a razor blade on the floor 2 weeks ago as he was walking barefoot.  Patient states he had a cut on his left heel which initially healed but then he went on a fishing trip this past weekend.  He states that water was roughened but was unstable and he got rocked around the foot quite a bit.  He feels that since then, his heel has been more painful and swollen.  Subjective:  Large blister noted on left foot near the pior swelling- not painful.  Assessment and Plan: Principal Problem:   Cellulitis left heel - ESR 65, CRP 3.5 - Unasyn  & vancomycin  - Ortho consult requested-8/14> bedside I and D performed today and about 10 cc of purulent material noted - 8/15 - I decompressed a large blister on the medial malleolus which already had a small incision- about 10-15 cc of cloudy, dark material expressed - culture sent today  Active Problems:   HFrEF (heart failure with reduced ejection fraction) - EF less than 20%, grade 1 diastolic dysfunction, moderate AI -Continue digoxin , empagliflozin , sacubitril -valsartan , spironolactone , carvedilol   Normocytic anemia    Code Status: Full Code Total time on patient care: 35 minutes DVT prophylaxis:  SCDs Start: 06/15/24 0543     Objective:   Vitals:   06/16/24 1955 06/16/24 2149 06/17/24 0503 06/17/24 1239  BP: 133/76  (!) 154/96 (!) 139/94  Pulse: 81 78 74 75  Resp: 18   18 17   Temp: 98.8 F (37.1 C)  98.5 F (36.9 C) 99.3 F (37.4 C)  TempSrc:      SpO2:  100% 100% 100%  Weight:      Height:       Filed Weights   06/14/24 1847  Weight: 75.2 kg   Exam: General exam: Appears comfortable  HEENT: oral mucosa moist Respiratory system: Clear to auscultation.  Cardiovascular system: S1 & S2 heard  Gastrointestinal system: Abdomen soft, non-tender, nondistended. Normal bowel sounds   Extremities: large blister on left medial malleolus- heel appears to be improving Psychiatry:  Mood & affect appropriate.      CBC: Recent Labs  Lab 06/14/24 1915 06/15/24 0704  WBC 9.3 9.3  NEUTROABS 6.6  --   HGB 9.4* 8.9*  HCT 29.5* 28.7*  MCV 93.9 95.7  PLT 434* 388   Basic Metabolic Panel: Recent Labs  Lab 06/14/24 1915 06/16/24 0458  NA 142  --   K 3.7  --   CL 106  --   CO2 24  --   GLUCOSE 120*  --   BUN 10  --   CREATININE 1.11 1.11  CALCIUM  9.3  --      Scheduled Meds:  aspirin  EC  81 mg Oral Daily   carvedilol   3.125 mg Oral BID   digoxin   0.125 mg Oral Daily   empagliflozin   10 mg Oral QAC breakfast   ezetimibe   10 mg Oral Daily   sacubitril -valsartan   1 tablet  Oral BID   spironolactone   25 mg Oral Daily    Imaging and lab data personally reviewed   Author: Sanyiah Kanzler  06/17/2024 6:50 PM  To contact [facility]>   Check the care team in [facility] and look for the attending/consulting [facility] provider listed  Log into [email or web address] and use [place]'s universal password   Go to> [facility]  and find provider  If you still have difficulty reaching the provider, please page the [facility] (Director on Call) for the Hospitalists listed on amion

## 2024-06-17 NOTE — Progress Notes (Signed)
 PATIENT ID: Darius Rivers  MRN: [id number]  DOB/AGE:  61-11-1960 / 61 y.o.      Subjective: Paitent reports that he is feeling better than yesterday. He has minimal soreness in the left foot.   Objective: Vital signs in last 24 hours: Temp:  [98.5 F (36.9 C)-98.8 F (37.1 C)] 98.5 F (36.9 C) (08/15 0503) Pulse Rate:  [71-81] 74 (08/15 0503) Resp:  [16-18] 18 (08/15 0503) BP: (133-168)/(76-103) 154/96 (08/15 0503) SpO2:  [100 %] 100 % (08/15 0503)  Intake/Output from previous day: 08/14 0701 - 08/15 0700 In: 1187 [P.O.:440; IV Piggyback:747] Out: 650 [Urine:650]   Recent Labs    06/14/24 1915 06/15/24 0704  HGB 9.4* 8.9*   Recent Labs    06/14/24 1915 06/15/24 0704  WBC 9.3 9.3  RBC 3.14* 3.00*  HCT 29.5* 28.7*  PLT 434* 388   Recent Labs    06/14/24 1915 06/16/24 0458  NA 142  --   K 3.7  --   CL 106  --   CO2 24  --   BUN 10  --   CREATININE 1.11 1.11  GLUCOSE 120*  --   CALCIUM  9.3  --      Physical Exam: Neurologically intact Intact pulses distally Dorsiflexion/Plantar flexion intact Compartment soft Patient able to move toes without difficulty Minimal tenderness about the left foot and ankle, only slightly tender around the area of the medial malleolus where he does have a 3cm sized blister which appears new since yesterday Areas of fluctuance that were evacuated yesterday remain decompressed. No active drainage. No surrounding erythema.   Assessment/Plan: Left foot/ankle superficial infection   Advance diet Continue ABX therapy Weight Bearing as Tolerated (WBAT)  Overall the patient appears to be feeling better since procedure yesterday. He does have essentially a blister that has formed since yesterday. At this time, will watch closely. May let blister resolve on its own vs decompressing blister at bedside. We continue to feel that surgery is not indicated at this time.    Today's  total administered Morphine  Milligram Equivalents:  0 Yesterday's total administered Morphine  Milligram Equivalents: 0  Taysha Majewski L. [place], PA-C 06/17/2024, 9:23 AM

## 2024-06-17 NOTE — Plan of Care (Signed)

## 2024-06-18 ENCOUNTER — Inpatient Hospital Stay (HOSPITAL_COMMUNITY)

## 2024-06-18 DIAGNOSIS — L0390 Cellulitis, unspecified: Secondary | ICD-10-CM | POA: Diagnosis not present

## 2024-06-18 LAB — CREATININE, SERUM
Creatinine, Ser: 1.03 mg/dL (ref 0.61–1.24)
GFR, Estimated: >60 mL/min (ref >60)

## 2024-06-18 MED ORDER — GADOBUTROL 1 MMOL/ML IV SOLN
7.0000 mL | Freq: Once | INTRAVENOUS | Status: AC | PRN
  Administered 2024-06-18: 7 mL via INTRAVENOUS

## 2024-06-18 NOTE — Progress Notes (Signed)
 [facility] Progress Note  Patient: Darius Rivers     FMW:[id number]  DOA: 06/14/2024   PCP: No primary care provider on file.       Brief hospital course: Isami Mehra is a 63 y.o. male with medical history significant of CAD, HFrEF, hypertension, hyperlipidemia, NSVT/PVCs, aortic valve insufficiency, prostate cancer, thoracic ascending aortic aneurysm, marijuana abuse.  Last admitted to the hospital 6/12-6/17/2025 for CHF exacerbation and AKI.  Patient presents at this time for evaluation of left foot swelling and pain in the setting of accidentally stepping on a razor blade on the floor 2 weeks ago as he was walking barefoot.  Patient states he had a cut on his left heel which initially healed but then he went on a fishing trip this past weekend.  He states that water was roughened but was unstable and he got rocked around the foot quite a bit.  He feels that since then, his heel has been more painful and swollen.  Subjective:  Pus expressed from left heel incision today. Area is painful.   Assessment and Plan: Principal Problem:   Cellulitis left heel - ESR 65, CRP 3.5 - Unasyn  & vancomycin  - Ortho consult requested-8/14> bedside I and D performed today and about 10 cc of purulent material noted - 8/15 - I decompressed a large blister on the medial malleolus which already had a small incision- about 10-15 cc of cloudy, dark material expressed - culture sent  - 8/16- left heel wound appeared clean and dry. However, I was able to express dark colored fluid from the wound- culture sent- ortho updated- MRI ordered - of note, today his foot has a foul smell similar to that of necrotic tissue  Active Problems:   HFrEF (heart failure with reduced ejection fraction) - EF less than 20%, grade 1 diastolic dysfunction, moderate AI -Continue digoxin , empagliflozin , sacubitril -valsartan , spironolactone , carvedilol   Normocytic anemia    Code Status: Full Code Total time on  patient care: 35 minutes DVT prophylaxis:  SCDs Start: 06/15/24 0543     Objective:   Vitals:   06/17/24 2051 06/18/24 0434 06/18/24 1052 06/18/24 1306  BP: (!) 155/86 (!) 156/99 (!) 146/95 (!) 145/108  Pulse: 68 70 77 73  Resp: 18 18 18 20   Temp: 98.7 F (37.1 C) 98.6 F (37 C) 98.8 F (37.1 C) 98.8 F (37.1 C)  TempSrc: Oral Oral Oral Oral  SpO2: 100% 100% 100% 100%  Weight:      Height:       Filed Weights   06/14/24 1847  Weight: 75.2 kg   Exam: General exam: Appears comfortable  HEENT: oral mucosa moist Respiratory system: Clear to auscultation.  Cardiovascular system: S1 & S2 heard  Gastrointestinal system: Abdomen soft, non-tender, nondistended. Normal bowel sounds   Extremities : blister on left medial malleolus has recurred- not as large as yesterday today- expressed dark brown fluid and pus from left heel wound Psychiatry:  Mood & affect appropriate.      CBC: Recent Labs  Lab 06/14/24 1915 06/15/24 0704  WBC 9.3 9.3  NEUTROABS 6.6  --   HGB 9.4* 8.9*  HCT 29.5* 28.7*  MCV 93.9 95.7  PLT 434* 388   Basic Metabolic Panel: Recent Labs  Lab 06/14/24 1915 06/16/24 0458 06/18/24 0531  NA 142  --   --   K 3.7  --   --   CL 106  --   --   CO2 24  --   --  GLUCOSE 120*  --   --   BUN 10  --   --   CREATININE 1.11 1.11 1.03  CALCIUM  9.3  --   --      Scheduled Meds:  aspirin  EC  81 mg Oral Daily   carvedilol   3.125 mg Oral BID   digoxin   0.125 mg Oral Daily   empagliflozin   10 mg Oral QAC breakfast   ezetimibe   10 mg Oral Daily   sacubitril -valsartan   1 tablet Oral BID   spironolactone   25 mg Oral Daily    Imaging and lab data personally reviewed   Author: Shaketha Jeon  06/18/2024 7:05 PM  To contact [facility]>   Check the care team in [facility] [address] and look for the attending/consulting [facility] provider listed  Log into [email or web address] and use [place]'s universal password   Go to> [facility]  and find provider  If you still  have difficulty reaching the provider, please page the [facility] (Director on Call) for the Hospitalists listed on amion

## 2024-06-18 NOTE — Plan of Care (Signed)

## 2024-06-18 NOTE — Progress Notes (Signed)
 Eswab collected from expressed drainage from L heel and sent to lab.

## 2024-06-18 NOTE — Progress Notes (Signed)
 Subjective:  Patient currently has no subjective complaints of pain.  He has not been having ability to get up and walk at this point.  Objective: Vital signs in last 24 hours: Temp:  [98.6 F (37 C)-99.3 F (37.4 C)] 98.6 F (37 C) (08/16 0434) Pulse Rate:  [68-75] 70 (08/16 0434) Resp:  [17-18] 18 (08/16 0434) BP: (139-156)/(86-99) 156/99 (08/16 0434) SpO2:  [100 %] 100 % (08/16 0434)  Intake/Output from previous day: 08/15 0701 - 08/16 0700 In: 1070 [P.O.:770; IV Piggyback:300] Out: 2000 [Urine:2000] Intake/Output this shift: Total I/O In: 420 [IV Piggyback:420] Out: -   No results for input(s): HGB in the last 72 hours. No results for input(s): WBC, RBC, HCT, PLT in the last 72 hours. Recent Labs    06/16/24 0458 06/18/24 0531  CREATININE 1.11 1.03   No results for input(s): LABPT, INR in the last 72 hours.  Left foot: 3 x 3 cm area of blister with some fluid which appears to be nonpurulent.  Minimal pain through range of motion.  No surrounding erythema or warmth.    Assessment/Plan: Blistered area over medial aspect of heel does not appear to be infectious.  Will allow medical service to treat as needed but do not feel that this is going to need any surgical intervention.  If there is concerns then MRI of the foot would be appropriate but at this point I think this is going to go on to do fine.     Norleen LITTIE Gavel 06/18/2024, 8:38 AM

## 2024-06-19 DIAGNOSIS — L0390 Cellulitis, unspecified: Secondary | ICD-10-CM | POA: Diagnosis not present

## 2024-06-19 LAB — AEROBIC CULTURE W GRAM STAIN (SUPERFICIAL SPECIMEN)

## 2024-06-19 LAB — CULTURE, BLOOD (ROUTINE X 2)
Culture: NO GROWTH
Culture: NO GROWTH
Special Requests: ADEQUATE
Special Requests: ADEQUATE

## 2024-06-19 LAB — CBC
HCT: 31.9 % — ABNORMAL LOW (ref 39.0–52.0)
Hemoglobin: 10.1 g/dL — ABNORMAL LOW (ref 13.0–17.0)
MCH: 29.5 pg (ref 26.0–34.0)
MCHC: 31.7 g/dL (ref 30.0–36.0)
MCV: 93.3 fL (ref 80.0–100.0)
Platelets: 400 K/uL (ref 150–400)
RBC: 3.42 MIL/uL — ABNORMAL LOW (ref 4.22–5.81)
RDW: 16.9 % — ABNORMAL HIGH (ref 11.5–15.5)
WBC: 7.2 K/uL (ref 4.0–10.5)
nRBC: 0 % (ref 0.0–0.2)

## 2024-06-19 NOTE — Progress Notes (Signed)
 [facility] Progress Note  Patient: Darius Rivers     FMW:[id number]  DOA: 06/14/2024   PCP: No primary care provider on file.       Brief hospital course: Darius Rivers is a 63 y.o. male with medical history significant of CAD, HFrEF, hypertension, hyperlipidemia, NSVT/PVCs, aortic valve insufficiency, prostate cancer, thoracic ascending aortic aneurysm, marijuana abuse.  Last admitted to the hospital 6/12-6/17/2025 for CHF exacerbation and AKI.  Patient presents at this time for evaluation of left foot swelling and pain in the setting of accidentally stepping on a razor blade on the floor 2 weeks ago as he was walking barefoot.  Patient states he had a cut on his left heel which initially healed but then he went on a fishing trip this past weekend.  He states that water was roughened but was unstable and he got rocked around the foot quite a bit.  He feels that since then, his heel has been more painful and swollen.  Subjective:  No complaints today.   Assessment and Plan: Principal Problem:   Cellulitis left heel - ESR 65, CRP 3.5 - Unasyn  & vancomycin  - Ortho consult requested-8/14> bedside I and D performed today and about 10 cc of purulent material noted - 8/15 - I decompressed a large blister on the medial malleolus which already had a small incision- about 10-15 cc of cloudy, dark material expressed - culture sent  - 8/16- left heel wound appeared clean and dry. However, I was able to express dark colored fluid from the wound- culture sent- ortho updated- MRI ordered - 8/17- bedside I and D performed - wound culture growing proteus and gr + cocci- cont Unasyn  and Vanc for now  Active Problems:   HFrEF (heart failure with reduced ejection fraction) - EF less than 20%, grade 1 diastolic dysfunction, moderate AI -Continue digoxin , empagliflozin , sacubitril -valsartan , spironolactone , carvedilol   Normocytic anemia    Code Status: Full Code Total time on patient  care: 35 minutes DVT prophylaxis:  SCDs Start: 06/15/24 0543     Objective:   Vitals:   06/18/24 1306 06/18/24 2102 06/19/24 0623 06/19/24 1211  BP: (!) 145/108 (!) 166/101 (!) 148/89 (!) 157/102  Pulse: 73 72 78 72  Resp: 20 19 18 20   Temp: 98.8 F (37.1 C) (!) 97.5 F (36.4 C) 98 F (36.7 C) 98.5 F (36.9 C)  TempSrc: Oral Oral Oral Oral  SpO2: 100% 100% 99% 100%  Weight:      Height:       Filed Weights   06/14/24 1847  Weight: 75.2 kg   Exam: General exam: Appears comfortable  HEENT: oral mucosa moist Respiratory system: Clear to auscultation.  Cardiovascular system: S1 & S2 heard  Gastrointestinal system: Abdomen soft, non-tender, nondistended. Normal bowel sounds   Extremities : dressing not opened today Psychiatry:  Mood & affect appropriate.      CBC: Recent Labs  Lab 06/14/24 1915 06/15/24 0704 06/19/24 0629  WBC 9.3 9.3 7.2  NEUTROABS 6.6  --   --   HGB 9.4* 8.9* 10.1*  HCT 29.5* 28.7* 31.9*  MCV 93.9 95.7 93.3  PLT 434* 388 400   Basic Metabolic Panel: Recent Labs  Lab 06/14/24 1915 06/16/24 0458 06/18/24 0531  NA 142  --   --   K 3.7  --   --   CL 106  --   --   CO2 24  --   --   GLUCOSE 120*  --   --  BUN 10  --   --   CREATININE 1.11 1.11 1.03  CALCIUM  9.3  --   --      Scheduled Meds:  aspirin  EC  81 mg Oral Daily   carvedilol   3.125 mg Oral BID   digoxin   0.125 mg Oral Daily   empagliflozin   10 mg Oral QAC breakfast   ezetimibe   10 mg Oral Daily   sacubitril -valsartan   1 tablet Oral BID   spironolactone   25 mg Oral Daily    Imaging and lab data personally reviewed   Author: Desma Wilkowski  06/19/2024 3:35 PM  To contact [facility]>   Check the care team in [facility] and look for the attending/consulting [facility] provider listed  Log into [email or web address] and use [place]'s universal password   Go to> [facility]  and find provider  If you still have difficulty reaching the provider, please page the [facility]  (Director on Call) for the Hospitalists listed on amion

## 2024-06-19 NOTE — Plan of Care (Signed)

## 2024-06-19 NOTE — Plan of Care (Signed)
°  Problem: Education: Goal: Knowledge of General Education information will improve Description: Including pain rating scale, medication(s)/side effects and non-pharmacologic comfort measures Outcome: Progressing   Problem: Clinical Measurements: Goal: Will remain free from infection Outcome: Progressing Goal: Diagnostic test results will improve Outcome: Progressing   Problem: Nutrition: Goal: Adequate nutrition will be maintained Outcome: Progressing   Problem: Safety: Goal: Ability to remain free from injury will improve Outcome: Progressing   Problem: Skin Integrity: Goal: Risk for impaired skin integrity will decrease Outcome: Progressing

## 2024-06-19 NOTE — Progress Notes (Addendum)
 PATIENT ID: Darius Rivers  MRN: [id number]  DOB/AGE:  1961/01/01 / 62 y.o.      Subjective: Patient reports that overall he continues to feel better. He reports minimal to no pain in the left foot. Denies fevers and chills. Cultures show proteus mirabilis, susceptibilities to follow.   Objective: Vital signs in last 24 hours: Temp:  [97.5 F (36.4 C)-98.8 F (37.1 C)] 98 F (36.7 C) (08/17 0623) Pulse Rate:  [72-78] 78 (08/17 0623) Resp:  [18-20] 18 (08/17 0623) BP: (145-166)/(89-108) 148/89 (08/17 0623) SpO2:  [99 %-100 %] 99 % (08/17 0623)  Intake/Output from previous day: 08/16 0701 - 08/17 0700 In: 1380 [P.O.:960; IV Piggyback:420] Out: 1950 [Urine:1950] Intake/Output this shift: Total I/O In: -  Out: 250 [Urine:250]  Recent Labs    06/19/24 0629  HGB 10.1*   Recent Labs    06/19/24 0629  WBC 7.2  RBC 3.42*  HCT 31.9*  PLT 400   Recent Labs    06/18/24 0531  CREATININE 1.03     Physical Exam: Neurologically intact Intact pulses distally Dorsiflexion/Plantar flexion intact Compartment soft Patient able to move toes without difficulty No tenderness about the left foot and ankle Scant purulent drainage from the heel from previous site of I&D. No surrounding warmth or erythema Blister near medial malleolus, slightly larger, encompassing about 4x2cm area with no active drainage until procedure was performed  Assessment/Plan: Left foot and ankle, superficial infection   Advance diet Weight Bearing as Tolerated (WBAT) VTE prophylaxis: aspirin  81mg  daily  Patient is appears clinically improved. Minimal drainage from heel wound. At instruction of Dr Dozier the blister was slightly opened and decompressed. Overall patient appears to be responding to antibiotics. We continue to feel that surgery is not necessary. Possibly would benefit from wound care consult but will see how foot looks tomorrow. MRI confirms no osteomyelitis. Will allow patient to eat today.  Will continue to follow. Medicine and ID to control antibiotic regimen  Procedure: The left foot was prepped with Betadine.  A 10 blade was used to make a small approximately 3 mm incision over the at the posteromedial aspect of the blister.   Approximately 4 cc of purulence came out of the wound. No connection to heel wound appreciated.  He tolerated this well. Foot and ankle were then dressed with 4 x 4's and an Ace bandage.  He tolerated the procedure well.    Today's  total administered Morphine  Milligram Equivalents: 0 Yesterday's total administered Morphine  Milligram Equivalents: 0  Clarann Helvey L. [place], PA-C 06/19/2024, 8:23 AM

## 2024-06-20 DIAGNOSIS — L0390 Cellulitis, unspecified: Secondary | ICD-10-CM | POA: Diagnosis not present

## 2024-06-20 NOTE — Plan of Care (Signed)
  Problem: Education: Goal: Knowledge of General Education information will improve Description: Including pain rating scale, medication(s)/side effects and non-pharmacologic comfort measures Outcome: Progressing   Problem: Health Behavior/Discharge Planning: Goal: Ability to manage health-related needs will improve Outcome: Progressing   Problem: Clinical Measurements: Goal: Will remain free from infection Outcome: Progressing Goal: Diagnostic test results will improve Outcome: Progressing   Problem: Nutrition: Goal: Adequate nutrition will be maintained Outcome: Progressing   Problem: Coping: Goal: Level of anxiety will decrease Outcome: Progressing   Problem: Pain Managment: Goal: General experience of comfort will improve and/or be controlled Outcome: Progressing   Problem: Safety: Goal: Ability to remain free from injury will improve Outcome: Progressing   Problem: Skin Integrity: Goal: Risk for impaired skin integrity will decrease Outcome: Progressing   Problem: Skin Integrity: Goal: Skin integrity will improve Outcome: Progressing

## 2024-06-20 NOTE — Progress Notes (Signed)
   PATIENT ID: Darius Rivers       Subjective: His foot is doing much better.  He was able to get up and walk on it yesterday a bit comfortably.  Objective:  Vitals:   06/19/24 2153 06/20/24 0512  BP: 136/84 (!) 164/97  Pulse: 73 74  Resp: 18 18  Temp: 98.2 F (36.8 C) 98.6 F (37 C)  SpO2: 100% 100%     Examination of the right foot shows the inferior heel wound remains slightly open and still draining scant amount of seropurulent drainage.  The superficial blister is decompressed and looks healthy.  The wound was re-bandaged with a small amount of vashe wound irrigant  Labs:  Recent Labs    06/19/24 0629  HGB 10.1*   Recent Labs    06/19/24 0629  WBC 7.2  RBC 3.42*  HCT 31.9*  PLT 400   Recent Labs    06/18/24 0531  CREATININE 1.03    Assessment and Plan:He is looking better From an orthopedic standpoint I think he can be discharged when medically stable on oral antibiotics with daily dressing changes.

## 2024-06-20 NOTE — Progress Notes (Signed)
 [facility] Progress Note  Patient: Darius Rivers     FMW:[id number]  DOA: 06/14/2024   PCP: No primary care provider on file.       Brief hospital course: Darius Rivers is a 63 y.o. male with medical history significant of CAD, HFrEF, hypertension, hyperlipidemia, NSVT/PVCs, aortic valve insufficiency, prostate cancer, thoracic ascending aortic aneurysm, marijuana abuse.  Last admitted to the hospital 6/12-6/17/2025 for CHF exacerbation and AKI.  Patient presents at this time for evaluation of left foot swelling and pain in the setting of accidentally stepping on a razor blade on the floor 2 weeks ago as he was walking barefoot.  Patient states he had a cut on his left heel which initially healed but then he went on a fishing trip this past weekend.  He states that water was roughened but was unstable and he got rocked around the foot quite a bit.  He feels that since then, his heel has been more painful and swollen.  Subjective:  No complaints.  Assessment and Plan: Principal Problem:   Cellulitis left heel - ESR 65, CRP 3.5 - Unasyn  & vancomycin  - Ortho consult requested-8/14> bedside I and D performed today and about 10 cc of purulent material noted - 8/15 - I decompressed a large blister on the medial malleolus which already had a small incision- about 10-15 cc of cloudy, dark material expressed - culture sent  - 8/16- left heel wound appeared clean and dry. However, I was able to express dark colored fluid from the wound- culture sent- ortho updated- MRI ordered - 8/17- bedside I and D performed - 8/18- pus expressed from heel wound again today - wound culture growing proteus and gr + cocci- cont Unasyn  and Vanc for now  Active Problems:   HFrEF (heart failure with reduced ejection fraction) - EF less than 20%, grade 1 diastolic dysfunction, moderate AI -Continue digoxin , empagliflozin , sacubitril -valsartan , spironolactone , carvedilol   Normocytic anemia    Code  Status: Full Code Total time on patient care: 35 minutes DVT prophylaxis:  SCDs Start: 06/15/24 0543     Objective:   Vitals:   06/19/24 1211 06/19/24 2153 06/20/24 0512 06/20/24 1304  BP: (!) 157/102 136/84 (!) 164/97 (!) 139/94  Pulse: 72 73 74 73  Resp: 20 18 18 17   Temp: 98.5 F (36.9 C) 98.2 F (36.8 C) 98.6 F (37 C) 98.4 F (36.9 C)  TempSrc: Oral Oral  Oral  SpO2: 100% 100% 100% 100%  Weight:      Height:       Filed Weights   06/14/24 1847  Weight: 75.2 kg   Exam: General exam: Appears comfortable  HEENT: oral mucosa moist Respiratory system: Clear to auscultation.  Cardiovascular system: S1 & S2 heard  Gastrointestinal system: Abdomen soft, non-tender, nondistended. Normal bowel sounds   Extremities : pus expressed from heel wound again today Psychiatry:  Mood & affect appropriate.      CBC: Recent Labs  Lab 06/14/24 1915 06/15/24 0704 06/19/24 0629  WBC 9.3 9.3 7.2  NEUTROABS 6.6  --   --   HGB 9.4* 8.9* 10.1*  HCT 29.5* 28.7* 31.9*  MCV 93.9 95.7 93.3  PLT 434* 388 400   Basic Metabolic Panel: Recent Labs  Lab 06/14/24 1915 06/16/24 0458 06/18/24 0531  NA 142  --   --   K 3.7  --   --   CL 106  --   --   CO2 24  --   --   GLUCOSE  120*  --   --   BUN 10  --   --   CREATININE 1.11 1.11 1.03  CALCIUM  9.3  --   --      Scheduled Meds:  aspirin  EC  81 mg Oral Daily   carvedilol   3.125 mg Oral BID   digoxin   0.125 mg Oral Daily   empagliflozin   10 mg Oral QAC breakfast   ezetimibe   10 mg Oral Daily   sacubitril -valsartan   1 tablet Oral BID   spironolactone   25 mg Oral Daily    Imaging and lab data personally reviewed   Author: Naylani Bradner  06/20/2024 7:13 PM  To contact [facility]>   Check the care team in [facility] and look for the attending/consulting [facility] provider listed  Log into [email or web address] and use [facility] universal password   Go to> [facility]  and find provider  If you still have difficulty  reaching the provider, please page the [facility] (Director on Call) for the Hospitalists listed on amion

## 2024-06-20 NOTE — Plan of Care (Signed)
  Problem: Education: Goal: Knowledge of General Education information will improve Description: Including pain rating scale, medication(s)/side effects and non-pharmacologic comfort measures Outcome: Progressing   Problem: Activity: Goal: Risk for activity intolerance will decrease Outcome: Progressing   Problem: Coping: Goal: Level of anxiety will decrease Outcome: Progressing   Problem: Safety: Goal: Ability to remain free from injury will improve Outcome: Progressing   Problem: Skin Integrity: Goal: Risk for impaired skin integrity will decrease Outcome: Progressing   Problem: Skin Integrity: Goal: Skin integrity will improve Outcome: Progressing

## 2024-06-21 ENCOUNTER — Other Ambulatory Visit (HOSPITAL_COMMUNITY): Payer: Self-pay

## 2024-06-21 DIAGNOSIS — L0390 Cellulitis, unspecified: Secondary | ICD-10-CM | POA: Diagnosis not present

## 2024-06-21 LAB — CREATININE, SERUM
Creatinine, Ser: 0.93 mg/dL (ref 0.61–1.24)
GFR, Estimated: >60 mL/min (ref >60)

## 2024-06-21 MED ORDER — SULFAMETHOXAZOLE-TRIMETHOPRIM 800-160 MG PO TABS
1.0000 | ORAL_TABLET | Freq: Two times a day (BID) | ORAL | 0 refills | Status: AC
Start: 2024-06-21 — End: 2024-06-28
  Filled 2024-06-21: qty 14, 7d supply, fill #0

## 2024-06-21 NOTE — Progress Notes (Signed)
 Discharge instructions given to patient questions asked and answered. Discharge medication delivered to patient at bedside [facility]

## 2024-06-21 NOTE — Discharge Summary (Signed)
 Physician Discharge Summary   Patient: Darius Rivers MRN: [id number]  DOB: October 23, 1961   Admit:     Date of Admission: 06/14/2024 Admitted from: home   Discharge: Date of discharge: 06/21/24 Disposition: Home Condition at discharge: good  CODE STATUS: FULL CODE     Discharge Physician: Laneta Blunt, DO [facility]     PCP: No primary care provider on file.  Recommendations for Outpatient Follow-up:  Follow up establish with PCP asap / cardiology as directed     Discharge Instructions     Diet - low sodium heart healthy   Complete by: As directed    Discharge wound care:   Complete by: As directed    Keep gauze over areas of drainage and change this at least daily, more frequently as needed / if soiled. Please keep follow up with your primary care doctors office in the next week or so. Please come back to the ER if severe pain, increased drainage, fever, or other concerns.   Increase activity slowly   Complete by: As directed          Discharge Diagnoses: Principal Problem:   Cellulitis Active Problems:   HFrEF (heart failure with reduced ejection fraction) (HCC)   CKD (chronic kidney disease), stage III [facility])   Essential hypertension   Normocytic anemia     [facility] course / significant events:   Darius Rivers is a 63 y.o. male with medical history significant of CAD, HFrEF, hypertension, hyperlipidemia, NSVT/PVCs, aortic valve insufficiency, prostate cancer, thoracic ascending aortic aneurysm, marijuana abuse.  Last admitted to the [facility] 6/12-6/17/2025 for CHF exacerbation and AKI.  Patient presents at this time for evaluation of left foot swelling and pain in the setting of accidentally stepping on a razor blade on the floor 2 weeks ago as he was walking barefoot.  Patient states he had a cut on his left heel which initially healed but then he went on a fishing trip this past weekend.  He states that water was roughened but was  unstable and he got rocked around the foot quite a bit.  He feels that since then, his heel has been more painful and swollen.   Admitted to hosptialist service. 08/14 bedside I&D performed by orthopedics and about 10 cc of purulent material noted, I&D again 08/17. Wound culture (+)proteus, dc on Bactrim  once cleared by ortho    Consultants:  orthopedics  Procedures/Surgeries: I&D L foot 08/14 and 08/17       ASSESSMENT & PLAN:   Cellulitis left heel S/p IV abx, transitioned to another week Bactrim  on discharge, pt eager to go home, feels confident to perform daily/bid dressing changes and follow up    HFrEF (heart failure with reduced ejection fraction) EF less than 20%, grade 1 diastolic dysfunction, moderate AI continue digoxin , empagliflozin , sacubitril -valsartan , spironolactone , carvedilol    Normocytic anemia Monitor outpatient     No concerns based on BMI: Body mass index is 20.18 kg/m.[name] Significantly low or high BMI is associated with higher medical risk.  Underweight - under 18  overweight - 25 to 29 obese - 30 or more Class 1 obesity: BMI of 30.0 to 34 Class 2 obesity: BMI of 35.0 to 39 Class 3 obesity: BMI of 40.0 to 49 Super Morbid Obesity: BMI 50-59 Super-super Morbid Obesity: BMI 60+ Healthy nutrition and physical activity advised as adjunct to other disease management and risk reduction treatments            Discharge Instructions  Allergies  as of 06/21/2024   No Known Allergies      Medication List     TAKE these medications    aspirin  EC 81 MG tablet Take 81 mg by mouth daily.   carvedilol  3.125 MG tablet Commonly known as: COREG  Take 1 tablet (3.125 mg total) by mouth 2 (two) times daily.   digoxin  0.125 MG tablet Commonly known as: LANOXIN  Take 1 tablet (0.125 mg total) by mouth daily.   empagliflozin  10 MG Tabs tablet Commonly known as: Jardiance  Take 1 tablet (10 mg total) by mouth daily before breakfast.   ezetimibe   10 MG tablet Commonly known as: ZETIA  Take 1 tablet (10 mg total) by mouth daily.   feeding supplement Liqd Take 237 mLs by mouth 2 (two) times daily between meals.   rosuvastatin  40 MG tablet Commonly known as: CRESTOR  Take 1 tablet (40 mg total) by mouth daily at 6 PM.   sacubitril -valsartan  24-26 MG Commonly known as: ENTRESTO  Take 1 tablet by mouth 2 (two) times daily.   senna-docusate 8.6-50 MG tablet Commonly known as: Senokot-S Take 1 tablet by mouth 2 (two) times daily as needed for moderate constipation.   spironolactone  25 MG tablet Commonly known as: ALDACTONE  Take 1 tablet (25 mg total) by mouth daily.   sulfamethoxazole -trimethoprim  800-160 MG tablet Commonly known as: Bactrim  DS Take 1 tablet by mouth 2 (two) times daily for 7 days.               Discharge Care Instructions  (From admission, onward)           Start     Ordered   06/21/24 0000  Discharge wound care:       Comments: Keep gauze over areas of drainage and change this at least daily, more frequently as needed / if soiled. Please keep follow up with your primary care doctors office in the next week or so. Please come back to the ER if severe pain, increased drainage, fever, or other concerns.   06/21/24 1311             Follow-up Information     Dozier Soulier, MD. Schedule an appointment as soon as possible for a visit on 06/27/2024.   Specialty: Orthopedic Surgery Contact information: [address] [place] [address] [phone]                 No Known Allergies   Subjective: pt feeling well today and eager to discharge home, no other concerns. Pain is controlled, he is able to ambulate on LLE without difficulty and with minimal pain    Discharge Exam: BP (!) 138/103 (BP Location: Right Arm)   Pulse 66   Temp 98.3 F (36.8 C) (Oral)   Resp 17   Ht 6' 4 (1.93 m)   Wt 75.2 kg   SpO2 100%   BMI 20.18 kg/m  General: Pt is alert, awake,  not in acute distress Cardiovascular: RRR, S1/S2 +, no rubs, no gallops Respiratory: CTA bilaterally, no wheezing, no rhonchi Abdominal: Soft, NT, ND, bowel sounds + Extremities: no edema, no cyanosis. Unwrapped LLE to examine foot - there is mild saturation of the gauze with thin yellowish/slightly opaque but not true thick purulent material. Surrounding skin is normal in appearance no erythema or tenderness      The results of significant diagnostics from this hospitalization (including imaging, microbiology, ancillary and laboratory) are listed below for reference.     Microbiology: Recent Results (from the past  240 hours)  Blood Cultures x 2 sites     Status: None   Collection Time: 06/14/24  7:15 PM   Specimen: BLOOD  Result Value Ref Range Status   Specimen Description   Final    BLOOD RIGHT ANTECUBITAL Performed at [facility], [address] [place], [place] [address]    Special Requests   Final    BOTTLES DRAWN AEROBIC AND ANAEROBIC Blood Culture adequate volume Performed at [facility], [address] [place], [place] [address]    Culture   Final    NO GROWTH 5 DAYS Performed at [facility], [address] [place], [place] [address]    Report Status 06/19/2024 FINAL  Final  Blood Cultures x 2 sites     Status: None   Collection Time: 06/14/24  7:20 PM   Specimen: BLOOD  Result Value Ref Range Status   Specimen Description   Final    BLOOD LEFT ANTECUBITAL Performed at [facility], [address] [facility] [address] [place], [place] [address]    Special Requests   Final    BOTTLES DRAWN AEROBIC ONLY Blood Culture adequate volume Performed at [facility], [address] [place], [place] [address]    Culture   Final    NO GROWTH 5 DAYS Performed at [facility], [address] [place], [place] [address]    Report Status 06/19/2024 FINAL  Final  Aerobic Culture w Gram Stain (superficial specimen)     Status: None    Collection Time: 06/17/24 12:57 PM   Specimen: Foot  Result Value Ref Range Status   Specimen Description FOOT  Final   Special Requests LEFT  Final   Gram Stain   Final    MODERATE SQUAMOUS EPITHELIAL CELLS PRESENT RARE WBC PRESENT, PREDOMINANTLY PMN RARE GRAM POSITIVE COCCI Performed at [facility], [address], [place], [place] [address]    Culture RARE PROTEUS MIRABILIS  Final   Report Status 06/19/2024 FINAL  Final   Organism ID, Bacteria PROTEUS MIRABILIS  Final      Susceptibility   Proteus mirabilis - MIC*    AMPICILLIN  <=2 SENSITIVE Sensitive     CEFAZOLIN (NON-URINE) 4 INTERMEDIATE Intermediate     CEFEPIME 0.25 SENSITIVE Sensitive     ERTAPENEM <=0.12 SENSITIVE Sensitive     CEFTRIAXONE <=0.25 SENSITIVE Sensitive     CIPROFLOXACIN <=0.06 SENSITIVE Sensitive     GENTAMICIN <=1 SENSITIVE Sensitive     MEROPENEM 1 SENSITIVE Sensitive     TRIMETH /SULFA  <=20 SENSITIVE Sensitive     AMPICILLIN /SULBACTAM <=2 SENSITIVE Sensitive     PIP/TAZO Value in next row Sensitive ug/mL     <=4 SENSITIVEThis is a modified FDA-approved test that has been validated and its performance characteristics determined by the reporting laboratory.  This laboratory is certified under the Clinical Laboratory Improvement Amendments CLIA as qualified to perform high complexity clinical laboratory testing.    * RARE PROTEUS MIRABILIS  Aerobic/Anaerobic Culture w Gram Stain (surgical/deep wound)     Status: None (Preliminary result)   Collection Time: 06/18/24  9:11 PM   Specimen: Heel; Abscess  Result Value Ref Range Status   Specimen Description   Final    HEEL Performed at [facility], [address], [place], [place] [address]    Special Requests   Final    NONE Performed at [facility], [address]  [place], [place] [address]    Gram Stain   Final    RARE WBC PRESENT, PREDOMINANTLY PMN RARE GRAM POSITIVE COCCI Performed at [facility], [address], [place], [place] [address]    Culture   Final    RARE PROTEUS MIRABILIS NO ANAEROBES ISOLATED; CULTURE IN PROGRESS FOR 5 DAYS    Report Status PENDING  Incomplete   Organism ID, Bacteria PROTEUS MIRABILIS  Final      Susceptibility   Proteus mirabilis - MIC*    AMPICILLIN  <=2 SENSITIVE Sensitive     CEFAZOLIN (NON-URINE) 4 INTERMEDIATE Intermediate     CEFEPIME 0.25 SENSITIVE Sensitive     ERTAPENEM <=0.12 SENSITIVE Sensitive     CEFTRIAXONE <=0.25 SENSITIVE Sensitive     CIPROFLOXACIN <=0.06 SENSITIVE Sensitive     GENTAMICIN <=1 SENSITIVE Sensitive     MEROPENEM 1 SENSITIVE Sensitive     TRIMETH /SULFA  <=20 SENSITIVE Sensitive     AMPICILLIN /SULBACTAM <=2 SENSITIVE Sensitive     PIP/TAZO Value in next row Sensitive ug/mL     <=4 SENSITIVEThis is a modified FDA-approved test that has been validated and its performance characteristics determined by the reporting laboratory.  This laboratory is certified under the Clinical Laboratory Improvement Amendments CLIA as qualified to perform high complexity clinical laboratory testing.    * RARE PROTEUS MIRABILIS     Labs: BNP (last 3 results) Recent Labs    12/23/23 0933 04/17/24 0231 05/05/24 1054  BNP 233.1* 616.6* 797.5*   Basic Metabolic Panel: Recent Labs  Lab 06/14/24 1915 06/16/24 0458 06/18/24 0531 06/21/24 0612  NA 142  --   --   --   K 3.7  --   --   --   CL 106  --   --   --   CO2 24  --   --   --   GLUCOSE 120*  --   --   --   BUN 10  --   --   --   CREATININE 1.11 1.11 1.03 0.93  CALCIUM  9.3  --   --   --    Liver Function Tests: Recent Labs  Lab 06/14/24 1915  AST 16  ALT <5  ALKPHOS 60  BILITOT 0.5  PROT 7.1  ALBUMIN  3.6   No results for input(s): LIPASE, AMYLASE in the last 168 hours. No results for input(s): AMMONIA in the last 168 hours. CBC: Recent Labs  Lab 06/14/24 1915 06/15/24 0704 06/19/24 0629  WBC 9.3 9.3 7.2  NEUTROABS 6.6  --   --   HGB 9.4* 8.9*  10.1*  HCT 29.5* 28.7* 31.9*  MCV 93.9 95.7 93.3  PLT 434* 388 400   Cardiac Enzymes: No results for input(s): CKTOTAL, CKMB, CKMBINDEX, TROPONINI in the last 168 hours. BNP: Invalid input(s): POCBNP CBG: No results for input(s): GLUCAP in the last 168 hours. D-Dimer No results for input(s): DDIMER in the last 72 hours. Hgb A1c No results for input(s): HGBA1C in the last 72 hours. Lipid Profile No results for input(s): CHOL, HDL, LDLCALC, TRIG, CHOLHDL, LDLDIRECT in the last 72 hours. Thyroid  function studies No results for input(s): TSH, T4TOTAL, T3FREE, THYROIDAB in the last 72 hours.  Invalid input(s): FREET3 Anemia work up No results for input(s): VITAMINB12, FOLATE, FERRITIN, TIBC, IRON, RETICCTPCT in the last 72 hours. Urinalysis    Component Value Date/Time   COLORURINE YELLOW 02/04/2021 0010   APPEARANCEUR CLEAR 02/04/2021 0010   LABSPEC 1.025 02/04/2021 0010   PHURINE 6.0  02/04/2021 0010   GLUCOSEU NEGATIVE 02/04/2021 0010   HGBUR MODERATE (A) 02/04/2021 0010   BILIRUBINUR NEGATIVE 02/04/2021 0010   KETONESUR NEGATIVE 02/04/2021 0010   PROTEINUR NEGATIVE 02/04/2021 0010   NITRITE NEGATIVE 02/04/2021 0010   LEUKOCYTESUR NEGATIVE 02/04/2021 0010   Sepsis Labs Recent Labs  Lab 06/14/24 1915 06/15/24 0704 06/19/24 0629  WBC 9.3 9.3 7.2   Microbiology Recent Results (from the past 240 hours)  Blood Cultures x 2 sites     Status: None   Collection Time: 06/14/24  7:15 PM   Specimen: BLOOD  Result Value Ref Range Status   Specimen Description   Final    BLOOD RIGHT ANTECUBITAL Performed at [facility], [address] [place], [place] [address]    Special Requests   Final    BOTTLES DRAWN AEROBIC AND ANAEROBIC Blood Culture adequate volume Performed at [facility], [address] [place], [place] [address]    Culture   Final    NO GROWTH 5 DAYS Performed at [facility], [address] [place], [place] [address]    Report Status 06/19/2024 FINAL  Final  Blood Cultures x 2 sites     Status: None   Collection Time: 06/14/24  7:20 PM   Specimen: BLOOD  Result Value Ref Range Status   Specimen Description   Final    BLOOD LEFT ANTECUBITAL Performed at [facility], [address] [place], [place] [address]    Special Requests   Final    BOTTLES DRAWN AEROBIC ONLY Blood Culture adequate volume Performed at [facility], [address] [place], [place] [address]    Culture   Final    NO GROWTH 5 DAYS Performed at [facility], [address] [place], [place] [address]    Report Status 06/19/2024 FINAL  Final  Aerobic Culture w Gram Stain (superficial specimen)     Status: None   Collection Time: 06/17/24 12:57 PM   Specimen: Foot  Result Value Ref Range Status   Specimen Description FOOT  Final   Special Requests LEFT  Final   Gram Stain   Final    MODERATE SQUAMOUS EPITHELIAL CELLS PRESENT RARE WBC PRESENT, PREDOMINANTLY PMN RARE GRAM POSITIVE COCCI Performed at [facility], [address] [place], [place] [address]    Culture RARE PROTEUS MIRABILIS  Final   Report Status 06/19/2024 FINAL  Final   Organism ID, Bacteria PROTEUS MIRABILIS  Final      Susceptibility   Proteus mirabilis - MIC*    AMPICILLIN  <=2 SENSITIVE Sensitive     CEFAZOLIN (NON-URINE) 4 INTERMEDIATE Intermediate     CEFEPIME 0.25 SENSITIVE Sensitive     ERTAPENEM <=0.12 SENSITIVE Sensitive     CEFTRIAXONE <=0.25 SENSITIVE Sensitive     CIPROFLOXACIN <=0.06 SENSITIVE Sensitive     GENTAMICIN <=1 SENSITIVE Sensitive     MEROPENEM 1 SENSITIVE Sensitive     TRIMETH /SULFA  <=20 SENSITIVE Sensitive     AMPICILLIN /SULBACTAM <=2 SENSITIVE Sensitive     PIP/TAZO Value in next row Sensitive ug/mL     <=4 SENSITIVEThis is a modified FDA-approved test that has been validated and its performance characteristics determined by the reporting  laboratory.  This laboratory is certified under the Clinical Laboratory Improvement Amendments CLIA as qualified to perform high complexity clinical laboratory testing.    * RARE PROTEUS MIRABILIS  Aerobic/Anaerobic Culture w Gram Stain (surgical/deep wound)  Status: None (Preliminary result)   Collection Time: 06/18/24  9:11 PM   Specimen: Heel; Abscess  Result Value Ref Range Status   Specimen Description   Final    HEEL Performed at [facility], [address] [place], [place] [address]    Special Requests   Final    NONE Performed at [facility], [address], [place], [place] [address]    Gram Stain   Final    RARE WBC PRESENT, PREDOMINANTLY PMN RARE GRAM POSITIVE COCCI Performed at [facility], [address], [place], [place] [address]    Culture   Final    RARE PROTEUS MIRABILIS NO ANAEROBES ISOLATED; CULTURE IN PROGRESS FOR 5 DAYS    Report Status PENDING  Incomplete   Organism ID, Bacteria PROTEUS MIRABILIS  Final      Susceptibility   Proteus mirabilis - MIC*    AMPICILLIN  <=2 SENSITIVE Sensitive     CEFAZOLIN (NON-URINE) 4 INTERMEDIATE Intermediate     CEFEPIME 0.25 SENSITIVE Sensitive     ERTAPENEM <=0.12 SENSITIVE Sensitive     CEFTRIAXONE <=0.25 SENSITIVE Sensitive     CIPROFLOXACIN <=0.06 SENSITIVE Sensitive     GENTAMICIN <=1 SENSITIVE Sensitive     MEROPENEM 1 SENSITIVE Sensitive     TRIMETH /SULFA  <=20 SENSITIVE Sensitive     AMPICILLIN /SULBACTAM <=2 SENSITIVE Sensitive     PIP/TAZO Value in next row Sensitive ug/mL     <=4 SENSITIVEThis is a modified FDA-approved test that has been validated and its performance characteristics determined by the reporting laboratory.  This laboratory is certified under the Clinical Laboratory Improvement Amendments CLIA as qualified to perform high complexity clinical laboratory testing.    * RARE PROTEUS MIRABILIS   Imaging DG Foot Complete Left Result Date:  06/14/2024 CLINICAL DATA:  Stepped on reasonably 2 weeks ago EXAM: LEFT FOOT - COMPLETE 3+ VIEW COMPARISON:  None Available. FINDINGS: There is no acute fracture or dislocation. There is a healed distal third metatarsal fracture. There is no evidence of arthropathy or other focal bone abnormality. Soft tissues are unremarkable. No foreign body identified. IMPRESSION: 1. No acute fracture or dislocation. 2. Healed distal third metatarsal fracture. Electronically Signed   By: Greig Pique M.D.   On: 06/14/2024 19:37      Time coordinating discharge: over 30 minutes  SIGNED:  Attilio Zeitler DO [facility]

## 2024-06-25 LAB — AEROBIC/ANAEROBIC CULTURE W GRAM STAIN (SURGICAL/DEEP WOUND)

## 2024-07-26 ENCOUNTER — Other Ambulatory Visit (HOSPITAL_COMMUNITY): Payer: Self-pay

## 2024-07-26 DIAGNOSIS — I5022 Chronic systolic (congestive) heart failure: Secondary | ICD-10-CM

## 2024-07-26 MED ORDER — EMPAGLIFLOZIN 10 MG PO TABS: 10.0000 mg | ORAL_TABLET | Freq: Every day | ORAL | 11 refills | Status: AC

## 2024-07-31 ENCOUNTER — Encounter (HOSPITAL_BASED_OUTPATIENT_CLINIC_OR_DEPARTMENT_OTHER): Payer: Self-pay

## 2024-07-31 ENCOUNTER — Other Ambulatory Visit: Payer: Self-pay

## 2024-07-31 ENCOUNTER — Emergency Department (HOSPITAL_BASED_OUTPATIENT_CLINIC_OR_DEPARTMENT_OTHER)

## 2024-07-31 ENCOUNTER — Emergency Department (HOSPITAL_BASED_OUTPATIENT_CLINIC_OR_DEPARTMENT_OTHER)
Admission: EM | Admit: 2024-07-31 | Discharge: 2024-08-01 | Disposition: A | Attending: Emergency Medicine | Admitting: Emergency Medicine

## 2024-07-31 DIAGNOSIS — Z8546 Personal history of malignant neoplasm of prostate: Secondary | ICD-10-CM | POA: Diagnosis not present

## 2024-07-31 DIAGNOSIS — I2510 Atherosclerotic heart disease of native coronary artery without angina pectoris: Secondary | ICD-10-CM | POA: Insufficient documentation

## 2024-07-31 DIAGNOSIS — Z7982 Long term (current) use of aspirin: Secondary | ICD-10-CM | POA: Diagnosis not present

## 2024-07-31 DIAGNOSIS — I509 Heart failure, unspecified: Secondary | ICD-10-CM | POA: Diagnosis not present

## 2024-07-31 DIAGNOSIS — R112 Nausea with vomiting, unspecified: Secondary | ICD-10-CM | POA: Insufficient documentation

## 2024-07-31 LAB — CBC
HCT: 33.6 % — ABNORMAL LOW (ref 39.0–52.0)
Hemoglobin: 10.9 g/dL — ABNORMAL LOW (ref 13.0–17.0)
MCH: 30.4 pg (ref 26.0–34.0)
MCHC: 32.4 g/dL (ref 30.0–36.0)
MCV: 93.9 fL (ref 80.0–100.0)
Platelets: 362 K/uL (ref 150–400)
RBC: 3.58 MIL/uL — ABNORMAL LOW (ref 4.22–5.81)
RDW: 18.3 % — ABNORMAL HIGH (ref 11.5–15.5)
WBC: 6.9 K/uL (ref 4.0–10.5)
nRBC: 0 % (ref 0.0–0.2)

## 2024-07-31 LAB — RESP PANEL BY RT-PCR (RSV, FLU A&B, COVID)  RVPGX2
Influenza A by PCR: NEGATIVE
Influenza B by PCR: NEGATIVE
Resp Syncytial Virus by PCR: NEGATIVE
SARS Coronavirus 2 by RT PCR: NEGATIVE

## 2024-07-31 LAB — COMPREHENSIVE METABOLIC PANEL WITH GFR
ALT: 8 U/L (ref 0–44)
AST: 20 U/L (ref 15–41)
Albumin: 4.7 g/dL (ref 3.5–5.0)
Alkaline Phosphatase: 66 U/L (ref 38–126)
Anion gap: 16 — ABNORMAL HIGH (ref 5–15)
BUN: 10 mg/dL (ref 8–23)
CO2: 25 mmol/L (ref 22–32)
Calcium: 10 mg/dL (ref 8.9–10.3)
Chloride: 104 mmol/L (ref 98–111)
Creatinine, Ser: 1.07 mg/dL (ref 0.61–1.24)
GFR, Estimated: >60 mL/min (ref >60)
Glucose, Bld: 120 mg/dL — ABNORMAL HIGH (ref 70–99)
Potassium: 4 mmol/L (ref 3.5–5.1)
Sodium: 144 mmol/L (ref 135–145)
Total Bilirubin: 0.6 mg/dL (ref 0.0–1.2)
Total Protein: 8.2 g/dL — ABNORMAL HIGH (ref 6.5–8.1)

## 2024-07-31 LAB — LIPASE, BLOOD: Lipase: 22 U/L (ref 11–51)

## 2024-07-31 LAB — CBG MONITORING, ED: Glucose-Capillary: 114 mg/dL — ABNORMAL HIGH (ref 70–99)

## 2024-07-31 MED ORDER — PROCHLORPERAZINE EDISYLATE 10 MG/2ML IJ SOLN
10.0000 mg | Freq: Once | INTRAMUSCULAR | Status: AC
  Administered 2024-07-31: 10 mg via INTRAVENOUS
  Filled 2024-07-31: qty 2

## 2024-07-31 MED ORDER — LACTATED RINGERS IV BOLUS
1000.0000 mL | Freq: Once | INTRAVENOUS | Status: AC
Start: 2024-07-31 — End: 2024-08-01
  Administered 2024-07-31: 1000 mL via INTRAVENOUS

## 2024-07-31 MED ORDER — IOHEXOL 300 MG/ML  SOLN
100.0000 mL | Freq: Once | INTRAMUSCULAR | Status: AC | PRN
  Administered 2024-07-31: 100 mL via INTRAVENOUS

## 2024-07-31 NOTE — ED Triage Notes (Signed)
 Pt reports being unable to tolerate food all day today. Reports emesis being yellow in color. Denies abdominal pain and diarrhea. Also c/o intermittent chills, but no fever.

## 2024-07-31 NOTE — ED Notes (Signed)
Pt is aware we need a urine specimen.  °

## 2024-07-31 NOTE — ED Provider Notes (Signed)
 [facility]  Provider Note  CSN: [id number] Arrival date & time: 07/31/24 2004  History Chief Complaint  Patient presents with   Emesis    Darius [facility] is a 63 y.o. male with history of prostate cancer, not currently getting treatment and CAD/CHF reports 1 day of persistent nausea and vomiting. No fever, no blood. No abdominal pain or diarrhea.No urinary symptoms. No recent change in medications.    Home Medications Prior to Admission medications   Medication Sig Start Date End Date Taking? Authorizing Provider  prochlorperazine  (COMPAZINE ) 10 MG tablet Take 1 tablet (10 mg total) by mouth 2 (two) times daily as needed for nausea or vomiting. 08/01/24  Yes Roselyn Carlin NOVAK, MD  aspirin  EC 81 MG tablet Take 81 mg by mouth daily.    [provider]  carvedilol  (COREG ) 3.125 MG tablet Take 1 tablet (3.125 mg total) by mouth 2 (two) times daily. 05/25/24 08/23/24  Bensimhon, Toribio SAUNDERS, MD  digoxin  (LANOXIN ) 0.125 MG tablet Take 1 tablet (0.125 mg total) by mouth daily. 04/19/24   Gonfa, Taye T, MD  empagliflozin  (JARDIANCE ) 10 MG TABS tablet Take 1 tablet (10 mg total) by mouth daily before breakfast. 07/26/24   Hayes Beckey CROME, NP  ezetimibe  (ZETIA ) 10 MG tablet Take 1 tablet (10 mg total) by mouth daily. 04/06/24   Hayes Beckey CROME, NP  feeding supplement (ENSURE PLUS HIGH PROTEIN) LIQD Take 237 mLs by mouth 2 (two) times daily between meals. 04/19/24   Gonfa, Taye T, MD  rosuvastatin  (CRESTOR ) 40 MG tablet Take 1 tablet (40 mg total) by mouth daily at 6 PM. Patient not taking: Reported on 06/15/2024 12/08/22   Hobart Powell BRAVO, MD  sacubitril -valsartan  (ENTRESTO ) 24-26 MG Take 1 tablet by mouth 2 (two) times daily. 04/19/24   Gonfa, Taye T, MD  senna-docusate (SENOKOT-S) 8.6-50 MG tablet Take 1 tablet by mouth 2 (two) times daily as needed for moderate constipation. Patient not taking: Reported on 06/15/2024 04/19/24   Gonfa, Taye T, MD   spironolactone  (ALDACTONE ) 25 MG tablet Take 1 tablet (25 mg total) by mouth daily. 05/05/24 08/03/24  Glena Harlene HERO, FNP     Allergies    Patient has no known allergies.   Review of Systems   Review of Systems Please see HPI for pertinent positives and negatives  Physical Exam BP (!) 198/105   Pulse 77   Temp 100.1 F (37.8 C) (Oral)   Resp 18   Ht 6' 4 (1.93 m)   Wt 78.5 kg   SpO2 98%   BMI 21.06 kg/m   Physical Exam Vitals and nursing note reviewed.  Constitutional:      Appearance: Normal appearance.  HENT:     Head: Normocephalic and atraumatic.     Nose: Nose normal.     Mouth/Throat:     Mouth: Mucous membranes are moist.  Eyes:     Extraocular Movements: Extraocular movements intact.     Conjunctiva/sclera: Conjunctivae normal.  Cardiovascular:     Rate and Rhythm: Normal rate.  Pulmonary:     Effort: Pulmonary effort is normal.     Breath sounds: Normal breath sounds.  Abdominal:     General: Abdomen is flat.     Palpations: Abdomen is soft.     Tenderness: There is no abdominal tenderness.  Musculoskeletal:        General: No swelling. Normal range of motion.     Cervical back: Neck supple.  Skin:    General: Skin is warm and dry.  Neurological:     General: No focal deficit present.     Mental Status: He is alert.  Psychiatric:        Mood and Affect: Mood normal.     ED Results / Procedures / Treatments   EKG None  Procedures Procedures  Medications Ordered in the ED Medications  iohexol  (OMNIPAQUE ) 300 MG/ML solution 100 mL (100 mLs Intravenous Contrast Given 07/31/24 2246)  prochlorperazine  (COMPAZINE ) injection 10 mg (10 mg Intravenous Given 07/31/24 2329)  lactated ringers  bolus 8,999 mL (1,000 mLs Intravenous Other (enter comment in med admin window) 07/31/24 2329)    Initial Impression and Plan  Patient here with vomiting, no other GI symptoms. Reports unable to keep down fluids today. Vitals are reassuring. Labs done in  triage show unremarkable CBC, CMP, lipase and Covid/Flu/RSV swab. I personally viewed the images from radiology studies and agree with radiologist interpretation: CT is neg for acute process. Will give antiemetics, IVF and reassess.   ED Course   Clinical Course as of 08/01/24 0128  Mon Aug 01, 2024  0126 Patient reports he is feeling better, UA with some blood but no infection. He is tolerating PO fluids and ready to go home. Rx for Compazine , advance diet as tolerated PCP follow up, RTED for any other concerns.   [CS]    Clinical Course User Index [CS] Roselyn Carlin NOVAK, MD     MDM Rules/Calculators/A&P Medical Decision Making Given presenting complaint, I considered that admission might be necessary. After review of results from ED lab and/or imaging studies, admission to the hospital is not indicated at this time.    Problems Addressed: Nausea and vomiting, unspecified vomiting type: acute illness or injury  Amount and/or Complexity of Data Reviewed Labs: ordered. Decision-making details documented in ED Course. Radiology: ordered and independent interpretation performed. Decision-making details documented in ED Course.  Risk Prescription drug management. Decision regarding hospitalization.     Final Clinical Impression(s) / ED Diagnoses Final diagnoses:  Nausea and vomiting, unspecified vomiting type    Rx / DC Orders ED Discharge Orders          Ordered    prochlorperazine  (COMPAZINE ) 10 MG tablet  2 times daily PRN        08/01/24 0128             Roselyn Carlin NOVAK, MD 08/01/24 [phone]

## 2024-07-31 NOTE — ED Notes (Signed)
 Patient transported to CT

## 2024-08-01 LAB — URINALYSIS, MICROSCOPIC (REFLEX)

## 2024-08-01 LAB — URINALYSIS, ROUTINE W REFLEX MICROSCOPIC
Bilirubin Urine: NEGATIVE
Glucose, UA: >=500 mg/dL — AB
Ketones, ur: 40 mg/dL — AB
Leukocytes,Ua: NEGATIVE
Nitrite: NEGATIVE
Protein, ur: 100 mg/dL — AB
Specific Gravity, Urine: 1.01 (ref 1.005–1.030)
pH: 6 (ref 5.0–8.0)

## 2024-08-01 MED ORDER — PROCHLORPERAZINE MALEATE 10 MG PO TABS: 10.0000 mg | ORAL_TABLET | Freq: Two times a day (BID) | ORAL | 0 refills | Status: AC | PRN

## 2024-08-01 NOTE — ED Notes (Signed)
 Pt calling for a ride home

## 2024-08-07 ENCOUNTER — Encounter (HOSPITAL_BASED_OUTPATIENT_CLINIC_OR_DEPARTMENT_OTHER): Payer: Self-pay | Admitting: Emergency Medicine

## 2024-08-07 ENCOUNTER — Emergency Department (HOSPITAL_BASED_OUTPATIENT_CLINIC_OR_DEPARTMENT_OTHER)
Admission: EM | Admit: 2024-08-07 | Discharge: 2024-08-07 | Disposition: A | Discharge status: Home / Self Care | Attending: Emergency Medicine | Admitting: Emergency Medicine

## 2024-08-07 ENCOUNTER — Other Ambulatory Visit: Payer: Self-pay

## 2024-08-07 ENCOUNTER — Emergency Department (HOSPITAL_BASED_OUTPATIENT_CLINIC_OR_DEPARTMENT_OTHER)

## 2024-08-07 DIAGNOSIS — N179 Acute kidney failure, unspecified: Secondary | ICD-10-CM | POA: Diagnosis not present

## 2024-08-07 DIAGNOSIS — R197 Diarrhea, unspecified: Secondary | ICD-10-CM | POA: Diagnosis not present

## 2024-08-07 DIAGNOSIS — I509 Heart failure, unspecified: Secondary | ICD-10-CM | POA: Insufficient documentation

## 2024-08-07 DIAGNOSIS — R0602 Shortness of breath: Secondary | ICD-10-CM | POA: Diagnosis present

## 2024-08-07 DIAGNOSIS — I9589 Other hypotension: Secondary | ICD-10-CM | POA: Insufficient documentation

## 2024-08-07 DIAGNOSIS — E860 Dehydration: Secondary | ICD-10-CM | POA: Diagnosis not present

## 2024-08-07 DIAGNOSIS — M25511 Pain in right shoulder: Secondary | ICD-10-CM | POA: Diagnosis not present

## 2024-08-07 DIAGNOSIS — M25512 Pain in left shoulder: Secondary | ICD-10-CM | POA: Diagnosis not present

## 2024-08-07 DIAGNOSIS — Z7982 Long term (current) use of aspirin: Secondary | ICD-10-CM | POA: Diagnosis not present

## 2024-08-07 DIAGNOSIS — E876 Hypokalemia: Secondary | ICD-10-CM | POA: Diagnosis not present

## 2024-08-07 DIAGNOSIS — E861 Hypovolemia: Secondary | ICD-10-CM | POA: Diagnosis not present

## 2024-08-07 LAB — CBC WITH DIFFERENTIAL/PLATELET
Abs Immature Granulocytes: 0.04 K/uL (ref 0.00–0.07)
Basophils Absolute: 0 K/uL (ref 0.0–0.1)
Basophils Relative: 0 %
Eosinophils Absolute: 0 K/uL (ref 0.0–0.5)
Eosinophils Relative: 0 %
HCT: 35 % — ABNORMAL LOW (ref 39.0–52.0)
Hemoglobin: 11.3 g/dL — ABNORMAL LOW (ref 13.0–17.0)
Immature Granulocytes: 1 %
Lymphocytes Relative: 29 %
Lymphs Abs: 2.5 K/uL (ref 0.7–4.0)
MCH: 30.2 pg (ref 26.0–34.0)
MCHC: 32.3 g/dL (ref 30.0–36.0)
MCV: 93.6 fL (ref 80.0–100.0)
Monocytes Absolute: 0.6 K/uL (ref 0.1–1.0)
Monocytes Relative: 7 %
Neutro Abs: 5.4 K/uL (ref 1.7–7.7)
Neutrophils Relative %: 63 %
Platelets: 385 K/uL (ref 150–400)
RBC: 3.74 MIL/uL — ABNORMAL LOW (ref 4.22–5.81)
RDW: 18.2 % — ABNORMAL HIGH (ref 11.5–15.5)
WBC: 8.6 K/uL (ref 4.0–10.5)
nRBC: 0 % (ref 0.0–0.2)

## 2024-08-07 LAB — COMPREHENSIVE METABOLIC PANEL WITH GFR
ALT: <5 U/L (ref 0–44)
AST: 14 U/L — ABNORMAL LOW (ref 15–41)
Albumin: 4 g/dL (ref 3.5–5.0)
Alkaline Phosphatase: 56 U/L (ref 38–126)
Anion gap: 13 (ref 5–15)
BUN: 24 mg/dL — ABNORMAL HIGH (ref 8–23)
CO2: 25 mmol/L (ref 22–32)
Calcium: 9.1 mg/dL (ref 8.9–10.3)
Chloride: 98 mmol/L (ref 98–111)
Creatinine, Ser: 1.4 mg/dL — ABNORMAL HIGH (ref 0.61–1.24)
GFR, Estimated: 57 mL/min — ABNORMAL LOW (ref >60)
Glucose, Bld: 105 mg/dL — ABNORMAL HIGH (ref 70–99)
Potassium: 3.4 mmol/L — ABNORMAL LOW (ref 3.5–5.1)
Sodium: 136 mmol/L (ref 135–145)
Total Bilirubin: 0.6 mg/dL (ref 0.0–1.2)
Total Protein: 7 g/dL (ref 6.5–8.1)

## 2024-08-07 LAB — URINALYSIS, ROUTINE W REFLEX MICROSCOPIC
Glucose, UA: >=500 mg/dL — AB
Ketones, ur: NEGATIVE mg/dL
Leukocytes,Ua: NEGATIVE
Nitrite: NEGATIVE
Protein, ur: 30 mg/dL — AB
Specific Gravity, Urine: 1.025 (ref 1.005–1.030)
pH: 5.5 (ref 5.0–8.0)

## 2024-08-07 LAB — URINALYSIS, MICROSCOPIC (REFLEX): WBC, UA: NONE SEEN WBC/hpf (ref 0–5)

## 2024-08-07 LAB — MAGNESIUM: Magnesium: 2.2 mg/dL (ref 1.7–2.4)

## 2024-08-07 LAB — ETHANOL: Alcohol, Ethyl (B): <15 mg/dL (ref <15)

## 2024-08-07 LAB — RESP PANEL BY RT-PCR (RSV, FLU A&B, COVID)  RVPGX2
Influenza A by PCR: NEGATIVE
Influenza B by PCR: NEGATIVE
Resp Syncytial Virus by PCR: NEGATIVE
SARS Coronavirus 2 by RT PCR: NEGATIVE

## 2024-08-07 LAB — LACTIC ACID, PLASMA
Lactic Acid, Venous: 2.1 mmol/L (ref 0.5–1.9)
Lactic Acid, Venous: 1.6 mmol/L (ref 0.5–1.9)

## 2024-08-07 LAB — TROPONIN T, HIGH SENSITIVITY
Troponin T High Sensitivity: 47 ng/L — ABNORMAL HIGH (ref 0–19)
Troponin T High Sensitivity: 43 ng/L — ABNORMAL HIGH (ref 0–19)

## 2024-08-07 LAB — LIPASE, BLOOD: Lipase: 25 U/L (ref 11–51)

## 2024-08-07 LAB — PRO BRAIN NATRIURETIC PEPTIDE: Pro Brain Natriuretic Peptide: 147 pg/mL (ref <300.0)

## 2024-08-07 MED ORDER — LACTATED RINGERS IV BOLUS
250.0000 mL | Freq: Once | INTRAVENOUS | Status: AC
  Administered 2024-08-07: 250 mL via INTRAVENOUS

## 2024-08-07 MED ORDER — POTASSIUM CHLORIDE 10 MEQ/100ML IV SOLN
10.0000 meq | INTRAVENOUS | Status: AC
  Administered 2024-08-07 (×3): 10 meq via INTRAVENOUS
  Filled 2024-08-07 (×3): qty 100

## 2024-08-07 MED ORDER — LACTATED RINGERS IV SOLN: INTRAVENOUS | Status: DC

## 2024-08-07 MED ORDER — POTASSIUM CHLORIDE CRYS ER 20 MEQ PO TBCR
40.0000 meq | EXTENDED_RELEASE_TABLET | Freq: Once | ORAL | Status: AC
Start: 2024-08-07 — End: 2024-08-07
  Administered 2024-08-07: 40 meq via ORAL
  Filled 2024-08-07: qty 2

## 2024-08-07 NOTE — Discharge Instructions (Signed)
 1.  At this time you appear to have been dehydrated and have mildly low potassium. 2.  Some of your medications can contribute to dehydration.  Also you described recently having some vomiting and diarrhea with difficulty eating and drinking.  Try to stay hydrated for the next couple of days and eat very bland low-fat foods. 3.  You should have a recheck with your doctor in approximately 2 to 3 days.  You need monitoring of your labs to make sure that your hydration is still good and that your potassium has not gotten to the lower side again.  This is very important to do this follow-up and make sure that you are back to your baseline. 4.  Return to the Emergency Department immediately if you feel lightheaded, have pain, cannot eat or drink or other concerning changes.

## 2024-08-07 NOTE — ED Triage Notes (Signed)
 Pt sts he is sluggish and has soreness around bil shoulders; sts his muscles feel too relaxed

## 2024-08-07 NOTE — ED Provider Notes (Signed)
 [place] [facility] AT [facility] Provider Note   CSN: [id number] Arrival date & time: 08/07/24  1518     Patient presents with: Shoulder Pain and Hypotension   Darius Rivers is a 63 y.o. male.   HPI Patient reports has had a feeling of some heavy up of his shoulders.  He is not endorsing a lot of other symptoms currently but reports that this is a symptom that he experienced when he had a low blood count previously.  Currently patient is denying any feeling of lightheadedness with standing or syncope or near syncope.  Patient does present to the emergency department with hypotension and in triage.  Patient denies he is experiencing exertional shortness of breath.  I reminded the patient had been seen in the emergency department for vomiting about a week ago.  He initially denied that he was having any ongoing problems with vomiting but then endorsed having vomited a little bit yesterday.  I asked if he is eating well or drinking and he thought he was doing okay but maybe not eating much.  Patient is now endorsing some diarrhea although difficult to elicit actual quality and frequency.  Patient denies he is experiencing chest pain.  He reports he just feels a little heavy in the shoulders.    Prior to Admission medications   Medication Sig Start Date End Date Taking? Authorizing Provider  aspirin  EC 81 MG tablet Take 81 mg by mouth daily.    [provider]  carvedilol  (COREG ) 3.125 MG tablet Take 1 tablet (3.125 mg total) by mouth 2 (two) times daily. 05/25/24 08/23/24  Bensimhon, Toribio SAUNDERS, MD  digoxin  (LANOXIN ) 0.125 MG tablet Take 1 tablet (0.125 mg total) by mouth daily. 04/19/24   Gonfa, Taye T, MD  empagliflozin  (JARDIANCE ) 10 MG TABS tablet Take 1 tablet (10 mg total) by mouth daily before breakfast. 07/26/24   Hayes Beckey CROME, NP  ezetimibe  (ZETIA ) 10 MG tablet Take 1 tablet (10 mg total) by mouth daily. 04/06/24   Hayes Beckey CROME, NP  feeding supplement  (ENSURE PLUS HIGH PROTEIN) LIQD Take 237 mLs by mouth 2 (two) times daily between meals. 04/19/24   Gonfa, Taye T, MD  prochlorperazine  (COMPAZINE ) 10 MG tablet Take 1 tablet (10 mg total) by mouth 2 (two) times daily as needed for nausea or vomiting. 08/01/24   Roselyn Carlin NOVAK, MD  rosuvastatin  (CRESTOR ) 40 MG tablet Take 1 tablet (40 mg total) by mouth daily at 6 PM. Patient not taking: Reported on 06/15/2024 12/08/22   Hobart Powell BRAVO, MD  sacubitril -valsartan  (ENTRESTO ) 24-26 MG Take 1 tablet by mouth 2 (two) times daily. 04/19/24   Gonfa, Taye T, MD  senna-docusate (SENOKOT-S) 8.6-50 MG tablet Take 1 tablet by mouth 2 (two) times daily as needed for moderate constipation. Patient not taking: Reported on 06/15/2024 04/19/24   Gonfa, Taye T, MD  spironolactone  (ALDACTONE ) 25 MG tablet Take 1 tablet (25 mg total) by mouth daily. 05/05/24 08/03/24  Glena Harlene HERO, FNP    Allergies: Patient has no known allergies.    Review of Systems  Updated Vital Signs BP (!) 143/85   Pulse 83   Temp 98 F (36.7 C) (Oral)   Resp (!) 21   Ht 6' 4.5 (1.943 m)   Wt 72.6 kg   SpO2 100%   BMI 19.22 kg/m   Physical Exam Constitutional:      Comments: Patient is alert and nontoxic.  Tall and thin.  No respiratory  distress.  HENT:     Head: Normocephalic and atraumatic.     Mouth/Throat:     Pharynx: Oropharynx is clear.  Eyes:     Extraocular Movements: Extraocular movements intact.  Cardiovascular:     Comments: Normal heart rate.  Patient has displaced PMI with easily palpable systolic contractions.  Monitor shows a regular rhythm with intermittent PVCs.  No appreciable JVD. Pulmonary:     Comments: Lung sounds are bilaterally clear.  No crackles at bases Abdominal:     General: There is no distension.     Palpations: Abdomen is soft.     Tenderness: There is no abdominal tenderness. There is no guarding.  Musculoskeletal:     Comments: Patient has long thin extremities.  He has no  peripheral edema, calves are soft and pliable.  Skin condition of lower extremities very good.  Skin:    General: Skin is warm and dry.  Neurological:     Comments: Patient is awake and alert.  He seems mildly cognitively slow or with possibly impaired historical recall.  His answers to historical questions are fairly vague and a little bit suggestible as if he does not remember very clearly.  At this time patient is alone providing history.  No focal motor deficits.     (all labs ordered are listed, but only abnormal results are displayed) Labs Reviewed  COMPREHENSIVE METABOLIC PANEL WITH GFR - Abnormal; Notable for the following components:      Result Value   Potassium 3.4 (*)    Glucose, Bld 105 (*)    BUN 24 (*)    Creatinine, Ser 1.40 (*)    AST 14 (*)    GFR, Estimated 57 (*)    All other components within normal limits  LACTIC ACID, PLASMA - Abnormal; Notable for the following components:   Lactic Acid, Venous 2.1 (*)    All other components within normal limits  CBC WITH DIFFERENTIAL/PLATELET - Abnormal; Notable for the following components:   RBC 3.74 (*)    Hemoglobin 11.3 (*)    HCT 35.0 (*)    RDW 18.2 (*)    All other components within normal limits  URINALYSIS, ROUTINE W REFLEX MICROSCOPIC - Abnormal; Notable for the following components:   Color, Urine AMBER (*)    Glucose, UA >=500 (*)    Hgb urine dipstick SMALL (*)    Bilirubin Urine SMALL (*)    Protein, ur 30 (*)    All other components within normal limits  URINALYSIS, MICROSCOPIC (REFLEX) - Abnormal; Notable for the following components:   Bacteria, UA RARE (*)    All other components within normal limits  TROPONIN T, HIGH SENSITIVITY - Abnormal; Notable for the following components:   Troponin T High Sensitivity 47 (*)    All other components within normal limits  TROPONIN T, HIGH SENSITIVITY - Abnormal; Notable for the following components:   Troponin T High Sensitivity 43 (*)    All other  components within normal limits  RESP PANEL BY RT-PCR (RSV, FLU A&B, COVID)  RVPGX2  ETHANOL  LACTIC ACID, PLASMA  LIPASE, BLOOD  PRO BRAIN NATRIURETIC PEPTIDE  MAGNESIUM     EKG: None  Radiology: DG Chest Port 1 View Result Date: 08/07/2024 CLINICAL DATA:  Hypotension. EXAM: PORTABLE CHEST 1 VIEW COMPARISON:  Radiograph and CT 04/14/2024 FINDINGS: Chronic cardiomegaly.The cardiomediastinal contours are unchanged. Aortic atherosclerosis. Pulmonary vasculature is normal. Emphysema. No consolidation, pleural effusion, or pneumothorax. No acute osseous abnormalities are seen. IMPRESSION: Chronic  cardiomegaly. Emphysema. Electronically Signed   By: Andrea Gasman M.D.   On: 08/07/2024 16:18     Procedures   Medications Ordered in the ED  lactated ringers  infusion (0 mLs Intravenous Stopped 08/07/24 2006)  potassium chloride  SA (KLOR-CON  M) CR tablet 40 mEq (has no administration in time range)  lactated ringers  bolus 250 mL (0 mLs Intravenous Stopped 08/07/24 1658)  potassium chloride  10 mEq in 100 mL IVPB (0 mEq Intravenous Stopped 08/07/24 1942)  lactated ringers  bolus 250 mL (0 mLs Intravenous Stopped 08/07/24 1714)                                    Medical Decision Making Amount and/or Complexity of Data Reviewed Labs: ordered. Radiology: ordered.  Risk Prescription drug management.   Patient presents as outlined.  Broad differential diagnosis.  Patient has known aortic insufficiency, CHF, abdominal aortic aneurysm, chronic kidney disease with risk factors for complex medical conditions.  Will proceed with lab work, EKGs and chest x-ray to evaluate for ACS\CHF\pneumonia\hypovolemia\metabolic derangement.  Currently patient does not appear septic.  He does not have fever and review of systems and clinical appearance do not suggest a focus of infection.  At this time will not start empiric antibiotics or aggressive fluid resuscitation.  Will start fluid resuscitation for  hypotension with suspected hypovolemia in the setting of history of CHF with reduced EF.  Urinalysis positive for protein negative nitrite negative leuk esterase 0-5 RBC, no WBC.  COVID influenza RSV negative ethanol less than 15 mag 2.2 BNP 147 lipase 25 GFR 57 BUN 24 creatinine 1.4 potassium 3.4 white count 8.6 H&H 13.3 and 35 troponin flat at 47 and 43.  Historically troponin baseline is 50s.  At this time I do not suspect ACS.  Patient's EKG has similar changes from older tracings.  Patient's BUN is at 24 which is elevated for him with a baseline closer to 10 or 11.  Creatinine with mild elevation at 1.4.  GFR 57.  This is slight elevation from patient's baseline of greater than 60.  At this time I suspect dehydration as etiology for very mild AKI compared to baseline.  Patient was rehydrated in smaller increments due to history of CHF.  However at this time BNP and chest x-ray do not suggest any volume overload.  Clinically patient shows signs of dehydration and does not have crackles or peripheral edema.  Patient was given LR and 250 mL boluses x 2.  He had continuous infusion of 125 an hour.  Patient was given potassium supplementation for mildly low potassium at 3.4.  IV potassium and oral potassium supplemented.  Upon recheck, patient reported feeling 100% better.  He no longer shows any hypotension.  Initially upon arrival patient had hypotension but after hydration and treatment, orthostatic vital signs are stable and patient was ambulated feeling well and at baseline.  At this time I suspect dehydration secondary to GI losses and poor oral intake.  At this time patient is not tolerating oral intake and is well in appearance.  Will plan for discharge.  Return precautions reviewed.  Patient voices understanding.  Dehydration     Final diagnoses:  Dehydration  Hypokalemia  Hypotension due to hypovolemia    ED Discharge Orders     None          Armenta Canning, MD 08/07/24  2018

## 2024-08-07 NOTE — ED Notes (Addendum)
 Pt ambulated independently and steadily around unit accompanied by RN. Pt denies dizziness. Pt states he feels significantly better than when he initially presented to ER. Pt given TV dinner, water, and crackers. Pt tolerating PO intake well. Pt denies nausea/vomiting. Provider notified.

## 2024-08-07 NOTE — ED Notes (Signed)
 Pt reminded of necessity of urine sample

## 2024-08-07 NOTE — ED Notes (Signed)
 Pt aware of necessity of urine sample.

## 2024-08-19 ENCOUNTER — Other Ambulatory Visit: Payer: Self-pay | Admitting: Physician Assistant

## 2024-08-19 DIAGNOSIS — I5042 Chronic combined systolic (congestive) and diastolic (congestive) heart failure: Secondary | ICD-10-CM

## 2024-08-23 ENCOUNTER — Encounter: Payer: Self-pay | Admitting: Physician Assistant

## 2024-09-02 ENCOUNTER — Ambulatory Visit (HOSPITAL_COMMUNITY)
Admission: RE | Admit: 2024-09-02 | Discharge: 2024-09-02 | Disposition: A | Discharge status: Home / Self Care | Source: Ambulatory Visit | Attending: Physician Assistant | Admitting: Physician Assistant

## 2024-09-02 ENCOUNTER — Encounter (HOSPITAL_COMMUNITY): Payer: Self-pay

## 2024-09-02 VITALS — BP 130/82 | HR 83 | Ht 76.5 in | Wt 165.6 lb

## 2024-09-02 DIAGNOSIS — N182 Chronic kidney disease, stage 2 (mild): Secondary | ICD-10-CM | POA: Diagnosis not present

## 2024-09-02 DIAGNOSIS — I447 Left bundle-branch block, unspecified: Secondary | ICD-10-CM | POA: Diagnosis not present

## 2024-09-02 DIAGNOSIS — I2510 Atherosclerotic heart disease of native coronary artery without angina pectoris: Secondary | ICD-10-CM | POA: Diagnosis not present

## 2024-09-02 DIAGNOSIS — I4729 Other ventricular tachycardia: Secondary | ICD-10-CM | POA: Diagnosis not present

## 2024-09-02 DIAGNOSIS — E785 Hyperlipidemia, unspecified: Secondary | ICD-10-CM | POA: Insufficient documentation

## 2024-09-02 DIAGNOSIS — I10 Essential (primary) hypertension: Secondary | ICD-10-CM | POA: Diagnosis not present

## 2024-09-02 DIAGNOSIS — I351 Nonrheumatic aortic (valve) insufficiency: Secondary | ICD-10-CM | POA: Insufficient documentation

## 2024-09-02 DIAGNOSIS — I7121 Aneurysm of the ascending aorta, without rupture: Secondary | ICD-10-CM | POA: Insufficient documentation

## 2024-09-02 DIAGNOSIS — I428 Other cardiomyopathies: Secondary | ICD-10-CM | POA: Insufficient documentation

## 2024-09-02 DIAGNOSIS — Z7984 Long term (current) use of oral hypoglycemic drugs: Secondary | ICD-10-CM | POA: Diagnosis not present

## 2024-09-02 DIAGNOSIS — I130 Hypertensive heart and chronic kidney disease with heart failure and stage 1 through stage 4 chronic kidney disease, or unspecified chronic kidney disease: Secondary | ICD-10-CM | POA: Diagnosis present

## 2024-09-02 DIAGNOSIS — Z7982 Long term (current) use of aspirin: Secondary | ICD-10-CM | POA: Diagnosis not present

## 2024-09-02 DIAGNOSIS — I5022 Chronic systolic (congestive) heart failure: Secondary | ICD-10-CM | POA: Diagnosis present

## 2024-09-02 LAB — BASIC METABOLIC PANEL WITH GFR
Anion gap: 9 (ref 5–15)
BUN: 11 mg/dL (ref 8–23)
CO2: 23 mmol/L (ref 22–32)
Calcium: 9.1 mg/dL (ref 8.9–10.3)
Chloride: 106 mmol/L (ref 98–111)
Creatinine, Ser: 1.12 mg/dL (ref 0.61–1.24)
GFR, Estimated: >60 mL/min (ref >60)
Glucose, Bld: 81 mg/dL (ref 70–99)
Potassium: 4.2 mmol/L (ref 3.5–5.1)
Sodium: 138 mmol/L (ref 135–145)

## 2024-09-02 MED ORDER — DIGOXIN 125 MCG PO TABS: 0.1250 mg | ORAL_TABLET | Freq: Every day | ORAL | 1 refills | Status: AC

## 2024-09-02 MED ORDER — SACUBITRIL-VALSARTAN 97-103 MG PO TABS: 1.0000 | ORAL_TABLET | Freq: Two times a day (BID) | ORAL | 5 refills | Status: AC

## 2024-09-02 MED ORDER — CARVEDILOL 6.25 MG PO TABS: 6.2500 mg | ORAL_TABLET | Freq: Two times a day (BID) | ORAL | 3 refills | Status: AC

## 2024-09-02 NOTE — Patient Instructions (Signed)
 Medication Changes:  STOP THE ENTRESTO  24/26  CONTINUE THE ENTRESTO  97/103MG  TWICE DAILY   INCREASE CARVEDILOL  TO 6.25MG  TWICE DAILY   Lab Work:  Labs done today, your results will be available in MyChart, we will contact you for abnormal readings.  Referrals:  YOU HAVE BEEN REFERRED TO [facility] THEY WILL REACH OUT TO YOU OR CALL TO ARRANGE THIS. PLEASE CALL US  WITH ANY CONCERNS   Follow-Up in: 3 months with Dr. Bensimhon PLEASE CALL OUR OFFICE AROUND DECEMBER TO GET SCHEDULED FOR YOUR APPOINTMENT. PHONE NUMBER IS [phone] OPTION 2   At the [facility], you and your health needs are our priority. We have a designated team specialized in the treatment of Heart Failure. This Care Team includes your primary Heart Failure Specialized Cardiologist (physician), Advanced Practice Providers (APPs- Physician Assistants and Nurse Practitioners), and Pharmacist who all work together to provide you with the care you need, when you need it.   You may see any of the following providers on your designated Care Team at your next follow up:  Dr. Toribio Fuel Dr. Ezra Shuck Dr. Odis Brownie Greig Mosses, NP Caffie Shed, [place] [facility] [place], [place] Beckey Coe, NP Jordan Lee, NP Tinnie Redman, PharmD   Please be sure to bring in all your medications bottles to every appointment.   Need to Contact Us :  If you have any questions or concerns before your next appointment please send us  a message through [place] or call our office at [phone].    TO LEAVE A MESSAGE FOR THE NURSE SELECT OPTION 2, PLEASE LEAVE A MESSAGE INCLUDING: YOUR NAME DATE OF BIRTH CALL BACK NUMBER REASON FOR CALL**this is important as we prioritize the call backs  YOU WILL RECEIVE A CALL BACK THE SAME DAY AS LONG AS YOU CALL BEFORE 4:00 PM

## 2024-09-02 NOTE — Progress Notes (Addendum)
 [facility] CONSULT NOTE  Primary Care: Joesph Silvan, PA Primary Cardiologist: Stanly DELENA Leavens, MD HF Cardiologist: Dr. Cherrie  HPI: Darius Rivers is a 63 y.o. male with systolic HF due to NICM, CAD, NSVT, CKD, HTN, HL, LBBB and asc Ao aneurysm (4.6 cm).     Diagnosed with HF in 2017 @ [facility]. EF 20% Cath with non-obs CAD.   Admitted 1/20 for N&V, abd pain, hypotension and lactic acidosis. Elevated troponin>>peak 1.57>>echo w/ EF 40-45%>>MV EF 34% w/inferior scar but no ischemia, no cath 2nd acute on chronic CKD (peak Cr 4.78), meds adjusted. Cr at d/c 1.26.    Echo 4/22: EF 25-30%% -> cath LAD 15% otherwise normal cors. RA 0 PA 5/1 PCWP 1  Fick 4.4/2.1   Admitted 2/25 for ADHF in setting of medication non-compliance. cMRI 2/25: Severely dilated LV (7.9 cm) EF 27%  + LGE in basilar inferoseptal wall No LVH. RVEF 42% AscAo aneurysm 4.6 cm.    Admitted 04/14/24 with decompensated HF in setting of HTN crisis. Echo EF < 20%, RV normal, moderate AI. BP in ED 160/110. Diuresed with IV lasix . RHC showed low filling pressures and reduced CI 1.97. GDMT titrated, digoxin  added with low CI. He was discharged home, weight 128 lbs.   Readmitted in 08/25 with cellulitis of left heal.   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 Smokes THC, rare ETOH drinks/week, had Lifevest in past but refused ICD. Seen in ED 09/28 with recurrent vomiting. Workup negative. Given IVF. Returned on 10/05 with hypotension and decreased PO intake. Given IVF and potassium replaced with clinical improvement.  He is here today for CHF follow-up.  Doing well from HF standpoint. No shortness of breath, orthopnea, PND or lower extremity edema. His appetite has improved and no longer having issues with nausea and hypotension. He is taking all medications, accidentally taking 2 doses of Entresto  (24/26 mg BID and 97/103 mg BID). Continues to work 3 days a week with events at [employer]. No ETOH or  tobacco use.  Cardiac Studies  - RHC 6/25: RA 1, PA 18/7 (10), PCWP 7, CO/CI (Fick) 3.85/1.97, PVR 0.3 WU, PAPi 11  - Echo 6/25: EF < 20%, markedly dilated RV normal, moderate AI   - cMRI 2/25: Severely dilated LV (7.9 cm) EF 27%  + LGE in basilar inferoseptal wall No LVH. RVEF 42% AscAo aneurysm 4.6 cm.   - R/LHC 4/22: RA 0, PA 5/1, PCWP 1, CO/CI (Fick) 4.4/2.1; 15% LAD otherwise normal cors.  - Echo 4/22: EF 25-30%, normal RV.  Past Medical History:  Diagnosis Date   Chronic kidney disease stage II    Coronary artery disease    NSTEMI in 11/2018 - Lg scar on Myoview ; Rx medically due to CKD // Cath 4/22: mLAD 15 (no sig CAD)   HFrEF (heart failure with reduced EF)    NICM // Echocardiogram 11/2018: EF 40-45 // Echocardiogram 4/22:  EF 25-30 // Echo 7/22: EF 40-45, global HK normal RVSF, mild LAE, mild MR, moderate AI, mild AV sclerosis without stenosis, mild dilation of aortic root (42 mm)   Hyperlipidemia    Hypertension    Lung nodule    CT 7/22: Left upper lobe 3 mm> repeat chest CT in 1 year   Nephrolithiasis    NSVT (nonsustained ventricular tachycardia)    Prostate cancer (HCC)    watchful waiting, due to go back to urology   Thoracic ascending aortic aneurysm    Echo  4/22: 44 mm // Chest CTA 7/22: Ascending thoracic aortic aneurysm 4.4 cm.  3 mm left upper lobe nodule.  Aortic atherosclerosis.   Current Outpatient Medications  Medication Sig Dispense Refill   aspirin  EC 81 MG tablet Take 81 mg by mouth daily.     carvedilol  (COREG ) 3.125 MG tablet Take 1 tablet (3.125 mg total) by mouth 2 (two) times daily. 180 tablet 3   digoxin  (LANOXIN ) 0.125 MG tablet Take 1 tablet (0.125 mg total) by mouth daily. 90 tablet 0   empagliflozin  (JARDIANCE ) 10 MG TABS tablet Take 1 tablet (10 mg total) by mouth daily before breakfast. 30 tablet 11   ezetimibe  (ZETIA ) 10 MG tablet TAKE 1 TABLET BY MOUTH DAILY 90 tablet 0   feeding supplement (ENSURE PLUS HIGH PROTEIN) LIQD Take 237 mLs by  mouth 2 (two) times daily between meals.     prochlorperazine  (COMPAZINE ) 10 MG tablet Take 1 tablet (10 mg total) by mouth 2 (two) times daily as needed for nausea or vomiting. 10 tablet 0   rosuvastatin  (CRESTOR ) 40 MG tablet Take 1 tablet (40 mg total) by mouth daily at 6 PM. 90 tablet 3   sacubitril -valsartan  (ENTRESTO ) 24-26 MG Take 1 tablet by mouth 2 (two) times daily. 180 tablet 0   sacubitril -valsartan  (ENTRESTO ) 97-103 MG Take 1 tablet by mouth 2 (two) times daily.     spironolactone  (ALDACTONE ) 25 MG tablet Take 1 tablet (25 mg total) by mouth daily. 90 tablet 3   senna-docusate (SENOKOT-S) 8.6-50 MG tablet Take 1 tablet by mouth 2 (two) times daily as needed for moderate constipation. (Patient not taking: Reported on 09/02/2024)     No current facility-administered medications for this encounter.   No Known Allergies  Social History   Socioeconomic History   Marital status: Divorced    Spouse name: Not on file   Number of children: 2   Years of education: Not on file   Highest education level: High school graduate  Occupational History   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  Tobacco Use   Smoking status: Former    Current packs/day: 0.00    Average packs/day: 0.3 packs/day for 20.0 years (6.6 ttl pk-yrs)    Types: Cigarettes    Start date: 11/11/1978    Quit date: 11/11/1998    Years since quitting: 25.8   Smokeless tobacco: Never  Vaping Use   Vaping status: Never Used  Substance and Sexual Activity   Alcohol use: Not Currently   Drug use: Yes    Types: Marijuana    Comment: 2 x per month   Sexual activity: Yes  Other Topics Concern   Not on file  Social History Narrative   Not on file   Social Drivers of Health   Financial Resource Strain: Low Risk  (04/15/2024)   Overall Financial Resource Strain (CARDIA)    Difficulty of Paying Living Expenses: Not very hard  Food Insecurity: No  Food Insecurity (06/14/2024)   Hunger Vital Sign    Worried About Running Out of Food in the Last Year: Never true    Ran Out of Food in the Last Year: Never true  Transportation Needs: No Transportation Needs (06/14/2024)   PRAPARE - Administrator, Civil Service (Medical): No    Lack of Transportation (Non-Medical): No  Physical Activity: Not on file  Stress: Not on file  Social Connections: Socially Isolated (04/14/2024)  Social Connection and Isolation Panel    Frequency of Communication with Friends and Family: Three times a week    Frequency of Social Gatherings with Friends and Family: More than three times a week    Attends Religious Services: Never    Database Administrator or Organizations: No    Attends Banker Meetings: Never    Marital Status: Widowed  Intimate Partner Violence: Not At Risk (06/14/2024)   Humiliation, Afraid, Rape, and Kick questionnaire    Fear of Current or Ex-Partner: No    Emotionally Abused: No    Physically Abused: No    Sexually Abused: No   Family History  Problem Relation Age of Onset   High blood pressure Mother    Heart attack Neg Hx    Cancer Neg Hx    Diabetes Neg Hx    Wt Readings from Last 3 Encounters:  09/02/24 75.1 kg (165 lb 9.6 oz)  08/07/24 72.6 kg (160 lb)  07/31/24 78.5 kg (173 lb)   BP 130/82   Pulse 83   Ht 6' 4.5 (1.943 m)   Wt 75.1 kg (165 lb 9.6 oz)   SpO2 99%   BMI 19.89 kg/m   PHYSICAL EXAM: General:  Ambulated into clinic. Tall, thin. Cor: No JVD. Regular rate & rhythm. 3/6 Diastolic murmur. Lungs: clear Abdomen: soft, nontender, nondistended. Extremities: no edema Neuro: alert & orientedx3. Affect pleasant    ASSESSMENT & PLAN: 1. Chronic systolic HF - NICM. - onset 2017 EF 20%.  - Echo 2020: EF 40-45% - Echo 4/22: EF 25-30%% -> cath LAD 15% otherwise normal cors.  - cMRI 2/25: Severely dilated LV (7.9 cm) EF 27%  + LGE in basilar inferoseptal wall No LVH. RVEF 42% AscAo  aneurysm 4.6cm  - Echo 6/25: EF < 20 Markedly dilated RV normal  Moderate AI - Etiology unclear. Suspect HTN as primary factor, but also has LBBB (142 ms now; was 126 ms in 2/25), AI and PVCs. - RHC 6/25: with low filling pressures and reduced CI.  - He has severe NICM with almost an 8 cm LV. Likely end-stage but reports NYHA I symptoms. Volume looks good. - Continue spironolactone  25 mg daily. - Continue Entresto  97/103 mg BID, stop the 24/26 mg dose - Continue Jardiance  10 daily. - Continue digoxin  0.125 mg daily. - Increase Coreg  to 6.25 mg BID - Has refused ICD in past. - May be close to advanced therapies but with cachexia and CKD he would be marginal candidate. - Given his height, aortic dilation with moderate AR, would consider evaluation for Marfan syndrome with FBN1 testing; will refer him to Dr. Danford Pac. - Repeat echo   2. HTN  - BP slightly above goal - Meds changes as above   3. CKD II - baseline Scr had been 1.6-1.7, now averaging 1.1. Recently up to 1.4 in setting of volume depletion. - Continue SGLT2i - Labs today   4. Aortic valve insufficiency - Moderate by last echo  - Due for repeat echo, will arrange   5. Ascending thoracic aortic aneurysm - Echo 4/22: 44 mm - CT 7/22: 44 mm >> rpt 1 year  - cMRI 2/25: 46 mm - Repeat echo   6. NSVT/PVCs - None on ECG a few weeks ago  His daughter moved from [place] to [place] to help with her parents' care. She requests an update if there are significant changes to his medical care.  Follow up 3 months with Dr. Bensimhon  Manuelita Dutch, PA-C 09/02/24

## 2024-09-05 ENCOUNTER — Ambulatory Visit (HOSPITAL_COMMUNITY): Payer: Self-pay | Admitting: Physician Assistant

## 2024-09-12 ENCOUNTER — Encounter (HOSPITAL_COMMUNITY): Payer: Self-pay | Admitting: *Deleted

## 2024-09-12 NOTE — Progress Notes (Signed)
 FMLA forms completed and signed by Dr Cherrie, faxed to Fredie Pal at [phone].  Pt aware, copy left at front desk for him to pick up

## 2024-09-13 ENCOUNTER — Ambulatory Visit (HOSPITAL_COMMUNITY)
Admission: RE | Admit: 2024-09-13 | Discharge: 2024-09-13 | Disposition: A | Discharge status: Home / Self Care | Source: Ambulatory Visit | Attending: Internal Medicine | Admitting: Internal Medicine

## 2024-09-13 DIAGNOSIS — I77810 Thoracic aortic ectasia: Secondary | ICD-10-CM | POA: Insufficient documentation

## 2024-09-13 DIAGNOSIS — I5022 Chronic systolic (congestive) heart failure: Secondary | ICD-10-CM | POA: Insufficient documentation

## 2024-09-13 DIAGNOSIS — I083 Combined rheumatic disorders of mitral, aortic and tricuspid valves: Secondary | ICD-10-CM | POA: Insufficient documentation

## 2024-09-13 DIAGNOSIS — I110 Hypertensive heart disease with heart failure: Secondary | ICD-10-CM | POA: Insufficient documentation

## 2024-09-13 LAB — ECHOCARDIOGRAM COMPLETE
AR max vel: 2.04 cm2
AV Area VTI: 2.11 cm2
AV Area mean vel: 1.72 cm2
AV Mean grad: 7 mmHg
AV Peak grad: 10.8 mmHg
Ao pk vel: 1.64 m/s
Area-P 1/2: 2.97 cm2
S' Lateral: 6.6 cm

## 2024-09-13 MED ORDER — PERFLUTREN LIPID MICROSPHERE
1.0000 mL | INTRAVENOUS | Status: AC | PRN
  Administered 2024-09-13: 2 mL via INTRAVENOUS

## 2024-10-26 ENCOUNTER — Other Ambulatory Visit: Payer: Self-pay

## 2024-10-26 DIAGNOSIS — I130 Hypertensive heart and chronic kidney disease with heart failure and stage 1 through stage 4 chronic kidney disease, or unspecified chronic kidney disease: Secondary | ICD-10-CM | POA: Diagnosis not present

## 2024-10-26 DIAGNOSIS — Z8546 Personal history of malignant neoplasm of prostate: Secondary | ICD-10-CM | POA: Diagnosis not present

## 2024-10-26 DIAGNOSIS — Z79899 Other long term (current) drug therapy: Secondary | ICD-10-CM | POA: Diagnosis not present

## 2024-10-26 DIAGNOSIS — I509 Heart failure, unspecified: Secondary | ICD-10-CM | POA: Insufficient documentation

## 2024-10-26 DIAGNOSIS — R112 Nausea with vomiting, unspecified: Secondary | ICD-10-CM | POA: Insufficient documentation

## 2024-10-26 DIAGNOSIS — Z7982 Long term (current) use of aspirin: Secondary | ICD-10-CM | POA: Insufficient documentation

## 2024-10-26 DIAGNOSIS — N189 Chronic kidney disease, unspecified: Secondary | ICD-10-CM | POA: Insufficient documentation

## 2024-10-26 DIAGNOSIS — M899 Disorder of bone, unspecified: Secondary | ICD-10-CM | POA: Diagnosis not present

## 2024-10-26 LAB — CBC
HCT: 32.4 % — ABNORMAL LOW (ref 39.0–52.0)
Hemoglobin: 10.7 g/dL — ABNORMAL LOW (ref 13.0–17.0)
MCH: 30.3 pg (ref 26.0–34.0)
MCHC: 33 g/dL (ref 30.0–36.0)
MCV: 91.8 fL (ref 80.0–100.0)
Platelets: 370 K/uL (ref 150–400)
RBC: 3.53 MIL/uL — ABNORMAL LOW (ref 4.22–5.81)
RDW: 16.7 % — ABNORMAL HIGH (ref 11.5–15.5)
WBC: 8.3 K/uL (ref 4.0–10.5)
nRBC: 0 % (ref 0.0–0.2)

## 2024-10-26 LAB — COMPREHENSIVE METABOLIC PANEL WITH GFR
ALT: 5 U/L (ref 0–44)
AST: 16 U/L (ref 15–41)
Albumin: 4.4 g/dL (ref 3.5–5.0)
Alkaline Phosphatase: 62 U/L (ref 38–126)
Anion gap: 16 — ABNORMAL HIGH (ref 5–15)
BUN: 10 mg/dL (ref 8–23)
CO2: 21 mmol/L — ABNORMAL LOW (ref 22–32)
Calcium: 10.1 mg/dL (ref 8.9–10.3)
Chloride: 103 mmol/L (ref 98–111)
Creatinine, Ser: 1.17 mg/dL (ref 0.61–1.24)
GFR, Estimated: >60 mL/min
Glucose, Bld: 127 mg/dL — ABNORMAL HIGH (ref 70–99)
Potassium: 4.2 mmol/L (ref 3.5–5.1)
Sodium: 141 mmol/L (ref 135–145)
Total Bilirubin: 0.6 mg/dL (ref 0.0–1.2)
Total Protein: 7.8 g/dL (ref 6.5–8.1)

## 2024-10-26 LAB — LIPASE, BLOOD: Lipase: 11 U/L (ref 11–51)

## 2024-10-26 NOTE — ED Triage Notes (Signed)
 Pt c/o abd pain today. Emesis x 1 appx 2100.  Denies diarrhea, fever.   Emesis x 1 in triage.

## 2024-10-27 ENCOUNTER — Emergency Department (HOSPITAL_BASED_OUTPATIENT_CLINIC_OR_DEPARTMENT_OTHER)

## 2024-10-27 ENCOUNTER — Emergency Department (HOSPITAL_BASED_OUTPATIENT_CLINIC_OR_DEPARTMENT_OTHER)
Admission: EM | Admit: 2024-10-27 | Discharge: 2024-10-27 | Disposition: A | Discharge status: Home / Self Care | Attending: Emergency Medicine | Admitting: Emergency Medicine

## 2024-10-27 DIAGNOSIS — R110 Nausea: Secondary | ICD-10-CM

## 2024-10-27 DIAGNOSIS — M899 Disorder of bone, unspecified: Secondary | ICD-10-CM

## 2024-10-27 LAB — URINALYSIS, ROUTINE W REFLEX MICROSCOPIC
Bilirubin Urine: NEGATIVE
Glucose, UA: >=500 mg/dL — AB
Ketones, ur: 40 mg/dL — AB
Leukocytes,Ua: NEGATIVE
Nitrite: NEGATIVE
Protein, ur: 100 mg/dL — AB
Specific Gravity, Urine: 1.025 (ref 1.005–1.030)
pH: 6 (ref 5.0–8.0)

## 2024-10-27 LAB — URINALYSIS, MICROSCOPIC (REFLEX)

## 2024-10-27 MED ORDER — SODIUM CHLORIDE 0.9 % IV BOLUS
250.0000 mL | Freq: Once | INTRAVENOUS | Status: AC
  Administered 2024-10-27: 250 mL via INTRAVENOUS

## 2024-10-27 MED ORDER — ONDANSETRON HCL 4 MG/2ML IJ SOLN
4.0000 mg | Freq: Once | INTRAMUSCULAR | Status: AC
  Administered 2024-10-27: 4 mg via INTRAVENOUS
  Filled 2024-10-27: qty 2

## 2024-10-27 MED ORDER — IOHEXOL 300 MG/ML  SOLN
100.0000 mL | Freq: Once | INTRAMUSCULAR | Status: AC | PRN
  Administered 2024-10-27: 100 mL via INTRAVENOUS

## 2024-10-27 NOTE — Discharge Instructions (Signed)
 Your CT scan showed spots on your spine that are concerning for spread of prostate cancer.  Please follow up with your urologist for further evaluation.  Get rechecked sooner if you have new or concerning symptoms.

## 2024-10-27 NOTE — ED Provider Notes (Signed)
 " [place] [facility] Provider Note   CSN: [id number] Arrival date & time: 10/26/24  2126     Patient presents with: Abdominal Pain   Daylyn Azbill is a 63 y.o. male.   The history is provided by the patient.  Abdominal Pain Ayrton Mcvay is a 63 y.o. male who presents to the Emergency Department complaining of nausea.  He presents to the emergency department for evaluation of 1 day of nausea with 2 episodes of emesis.  He is not having any associate abdominal pain no chest pain, shortness of breath, fever, diarrhea, dysuria.  No known sick contacts.  He has a history of CHF with a EF around 20%, hypertension, CKD, prostate cancer.   Prior to Admission medications  Medication Sig Start Date End Date Taking? Authorizing Provider  aspirin  EC 81 MG tablet Take 81 mg by mouth daily.    [provider]  carvedilol  (COREG ) 6.25 MG tablet Take 1 tablet (6.25 mg total) by mouth 2 (two) times daily. 09/02/24   Colletta Manuelita Garre, PA-C  digoxin  (LANOXIN ) 0.125 MG tablet Take 1 tablet (0.125 mg total) by mouth daily. 09/02/24   Colletta Manuelita Garre, PA-C  empagliflozin  (JARDIANCE ) 10 MG TABS tablet Take 1 tablet (10 mg total) by mouth daily before breakfast. 07/26/24   Hayes Beckey CROME, NP  ezetimibe  (ZETIA ) 10 MG tablet TAKE 1 TABLET BY MOUTH DAILY 08/19/24   Lelon Hamilton T, PA-C  feeding supplement (ENSURE PLUS HIGH PROTEIN) LIQD Take 237 mLs by mouth 2 (two) times daily between meals. 04/19/24   Gonfa, Taye T, MD  prochlorperazine  (COMPAZINE ) 10 MG tablet Take 1 tablet (10 mg total) by mouth 2 (two) times daily as needed for nausea or vomiting. 08/01/24   Roselyn Carlin NOVAK, MD  rosuvastatin  (CRESTOR ) 40 MG tablet Take 1 tablet (40 mg total) by mouth daily at 6 PM. 12/08/22   Hobart Powell BRAVO, MD  sacubitril -valsartan  (ENTRESTO ) 97-103 MG Take 1 tablet by mouth 2 (two) times daily. 09/02/24   Colletta Manuelita Garre, PA-C  senna-docusate  (SENOKOT-S) 8.6-50 MG tablet Take 1 tablet by mouth 2 (two) times daily as needed for moderate constipation. Patient not taking: Reported on 09/02/2024 04/19/24   Gonfa, Taye T, MD  spironolactone  (ALDACTONE ) 25 MG tablet Take 1 tablet (25 mg total) by mouth daily. 05/05/24 09/02/25  Glena Harlene HERO, FNP    Allergies: Patient has no known allergies.    Review of Systems  Gastrointestinal:  Positive for abdominal pain.  All other systems reviewed and are negative.   Updated Vital Signs BP (!) 149/105   Pulse 71   Temp 97.9 F (36.6 C)   Resp 16   Ht 6' 4.5 (1.943 m)   SpO2 99%   BMI 19.89 kg/m   Physical Exam Vitals and nursing note reviewed.  Constitutional:      Appearance: He is well-developed.  HENT:     Head: Normocephalic and atraumatic.  Cardiovascular:     Rate and Rhythm: Normal rate and regular rhythm.     Heart sounds: Murmur heard.  Pulmonary:     Effort: Pulmonary effort is normal. No respiratory distress.     Breath sounds: Normal breath sounds.  Abdominal:     Palpations: Abdomen is soft.     Tenderness: There is no abdominal tenderness. There is no guarding or rebound.  Musculoskeletal:        General: No swelling or tenderness.  Skin:    General:  Skin is warm and dry.  Neurological:     Mental Status: He is alert and oriented to person, place, and time.  Psychiatric:        Behavior: Behavior normal.     (all labs ordered are listed, but only abnormal results are displayed) Labs Reviewed  COMPREHENSIVE METABOLIC PANEL WITH GFR - Abnormal; Notable for the following components:      Result Value   CO2 21 (*)    Glucose, Bld 127 (*)    Anion gap 16 (*)    All other components within normal limits  CBC - Abnormal; Notable for the following components:   RBC 3.53 (*)    Hemoglobin 10.7 (*)    HCT 32.4 (*)    RDW 16.7 (*)    All other components within normal limits  URINALYSIS, ROUTINE W REFLEX MICROSCOPIC - Abnormal; Notable for the  following components:   Glucose, UA >=500 (*)    Hgb urine dipstick MODERATE (*)    Ketones, ur 40 (*)    Protein, ur 100 (*)    All other components within normal limits  URINALYSIS, MICROSCOPIC (REFLEX) - Abnormal; Notable for the following components:   Bacteria, UA RARE (*)    All other components within normal limits  LIPASE, BLOOD    EKG: EKG Interpretation Date/Time:  Wednesday October 26 2024 21:42:37 EST Ventricular Rate:  59 PR Interval:  183 QRS Duration:  136 QT Interval:  454 QTC Calculation: 450 R Axis:   -66  Text Interpretation: Sinus rhythm Probable left atrial enlargement Left bundle branch block No significant change since last tracing Confirmed by Dreama Longs ([id number]) on 10/26/2024 9:56:39 PM  Radiology: CT ABDOMEN PELVIS W CONTRAST Result Date: 10/27/2024 EXAM: CT ABDOMEN AND PELVIS WITH CONTRAST 10/27/2024 01:27:32 AM TECHNIQUE: CT of the abdomen and pelvis was performed with the administration of intravenous contrast. Multiplanar reformatted images are provided for review. Automated exposure control, iterative reconstruction, and/or weight-based adjustment of the mA/kV was utilized to reduce the radiation dose to as low as reasonably achievable. 100 mL of iohexol  (OMNIPAQUE ) 300 MG/ML solution was administered intravenously. COMPARISON: Comparison with 07/31/2024. CLINICAL HISTORY: Abdominal pain, acute, nonlocalized; Bowel obstruction suspected. FINDINGS: LOWER CHEST: Dilated left ventricle. LIVER: Hepatic cyst. GALLBLADDER AND BILE DUCTS: Gallbladder is unremarkable. No biliary ductal dilatation. SPLEEN: No acute abnormality. PANCREAS: No acute abnormality. ADRENAL GLANDS: No acute abnormality. KIDNEYS, URETERS AND BLADDER: Punctate nonobstructing bilateral nephrolithiasis. No hydronephrosis. No perinephric or periureteral stranding. Urinary bladder is unremarkable. Enlarged prostate. GI AND BOWEL: Stomach demonstrates no acute abnormality. Normal caliber  large and small bowel. No obstruction. PERITONEUM AND RETROPERITONEUM: No ascites. No free air. VASCULATURE: Aorta is normal in caliber. Aortic atherosclerotic calcification. LYMPH NODES: No lymphadenopathy. REPRODUCTIVE ORGANS: No acute abnormality. BONES AND SOFT TISSUES: Sclerotic lesions in the left superior pubic ramus are unchanged. Increased size of the sclerotic lesion in the L3 vertebral body now measuring 1.3 cm, previously 1.1 cm. Increased size of the sclerotic lesion in T12 now measuring 1.1 cm, previously 0.9 cm. No focal soft tissue abnormality. IMPRESSION: 1. No acute abnormality in the abdomen or pelvis. 2. Increased size of the sclerotic lesions in T12 and L3 suspicious for metastases. Similar sclerotic lesions in the left superior pubic ramus. 3. Enlarged prostate. Electronically signed by: Norman Gatlin MD 10/27/2024 01:37 AM EST RP Workstation: [id number]     Procedures   Medications Ordered in the ED  ondansetron  (ZOFRAN ) injection 4 mg (4 mg Intravenous Given 10/27/24 0106)  iohexol  (OMNIPAQUE ) 300 MG/ML solution 100 mL (100 mLs Intravenous Contrast Given 10/27/24 0127)  sodium chloride  0.9 % bolus 250 mL (0 mLs Intravenous Stopped 10/27/24 0453)                                    Medical Decision Making Amount and/or Complexity of Data Reviewed Labs: ordered. Radiology: ordered.  Risk Prescription drug management.   Patient with history of CHF, CKD here for evaluation of 2 episodes of emesis.  He is nontoxic-appearing on evaluation with no abdominal tenderness.  He has no chest pain, difficulty breathing or signs of acute congestive heart failure.  Labs with stable anemia.  UA is not consistent with UTI.  He was treated with antiemetic, IV fluid in the emergency department and he is able to tolerate p.o. without difficulty.  CT abdomen pelvis is negative for acute obstructive process or evidence of acute infectious process.  He was informed of findings of sclerotic  bone lesions concerning for metastatic disease process.  He does state he has a history of prostate cancer and follows with urology.  Discussed that he will need to follow-up closely with urology for further evaluation.  Feel he is stable for discharge home with outpatient follow-up as well as return precautions.     Final diagnoses:  Nausea  Bone lesion    ED Discharge Orders     None          Griselda Norris, MD 10/27/24 [phone]  "

## 2024-10-27 NOTE — ED Notes (Signed)
 Patient transported to CT

## 2024-10-30 ENCOUNTER — Encounter (HOSPITAL_BASED_OUTPATIENT_CLINIC_OR_DEPARTMENT_OTHER): Payer: Self-pay | Admitting: Emergency Medicine

## 2024-10-30 ENCOUNTER — Emergency Department (HOSPITAL_BASED_OUTPATIENT_CLINIC_OR_DEPARTMENT_OTHER)
Admission: EM | Admit: 2024-10-30 | Discharge: 2024-10-30 | Disposition: A | Discharge status: Home / Self Care | Attending: Emergency Medicine | Admitting: Emergency Medicine

## 2024-10-30 DIAGNOSIS — E119 Type 2 diabetes mellitus without complications: Secondary | ICD-10-CM | POA: Diagnosis not present

## 2024-10-30 DIAGNOSIS — N179 Acute kidney failure, unspecified: Secondary | ICD-10-CM | POA: Diagnosis not present

## 2024-10-30 DIAGNOSIS — Z85830 Personal history of malignant neoplasm of bone: Secondary | ICD-10-CM | POA: Diagnosis not present

## 2024-10-30 DIAGNOSIS — R10A2 Flank pain, left side: Secondary | ICD-10-CM | POA: Diagnosis present

## 2024-10-30 DIAGNOSIS — I10 Essential (primary) hypertension: Secondary | ICD-10-CM | POA: Diagnosis not present

## 2024-10-30 DIAGNOSIS — M898X9 Other specified disorders of bone, unspecified site: Secondary | ICD-10-CM

## 2024-10-30 LAB — COMPREHENSIVE METABOLIC PANEL WITH GFR
ALT: <5 U/L (ref 0–44)
AST: 18 U/L (ref 15–41)
Albumin: 4.3 g/dL (ref 3.5–5.0)
Alkaline Phosphatase: 61 U/L (ref 38–126)
Anion gap: 16 — ABNORMAL HIGH (ref 5–15)
BUN: 42 mg/dL — ABNORMAL HIGH (ref 8–23)
CO2: 22 mmol/L (ref 22–32)
Calcium: 9.8 mg/dL (ref 8.9–10.3)
Chloride: 95 mmol/L — ABNORMAL LOW (ref 98–111)
Creatinine, Ser: 1.96 mg/dL — ABNORMAL HIGH (ref 0.61–1.24)
GFR, Estimated: 38 mL/min — ABNORMAL LOW
Glucose, Bld: 110 mg/dL — ABNORMAL HIGH (ref 70–99)
Potassium: 3.8 mmol/L (ref 3.5–5.1)
Sodium: 133 mmol/L — ABNORMAL LOW (ref 135–145)
Total Bilirubin: 0.9 mg/dL (ref 0.0–1.2)
Total Protein: 7.7 g/dL (ref 6.5–8.1)

## 2024-10-30 LAB — CBC WITH DIFFERENTIAL/PLATELET
Abs Immature Granulocytes: 0.02 K/uL (ref 0.00–0.07)
Basophils Absolute: 0 K/uL (ref 0.0–0.1)
Basophils Relative: 0 %
Eosinophils Absolute: 0 K/uL (ref 0.0–0.5)
Eosinophils Relative: 0 %
HCT: 37.3 % — ABNORMAL LOW (ref 39.0–52.0)
Hemoglobin: 12.6 g/dL — ABNORMAL LOW (ref 13.0–17.0)
Immature Granulocytes: 0 %
Lymphocytes Relative: 26 %
Lymphs Abs: 2.4 K/uL (ref 0.7–4.0)
MCH: 30.5 pg (ref 26.0–34.0)
MCHC: 33.8 g/dL (ref 30.0–36.0)
MCV: 90.3 fL (ref 80.0–100.0)
Monocytes Absolute: 0.9 K/uL (ref 0.1–1.0)
Monocytes Relative: 10 %
Neutro Abs: 5.8 K/uL (ref 1.7–7.7)
Neutrophils Relative %: 64 %
Platelets: 362 K/uL (ref 150–400)
RBC: 4.13 MIL/uL — ABNORMAL LOW (ref 4.22–5.81)
RDW: 16.4 % — ABNORMAL HIGH (ref 11.5–15.5)
WBC: 9.1 K/uL (ref 4.0–10.5)
nRBC: 0 % (ref 0.0–0.2)

## 2024-10-30 LAB — URINALYSIS, W/ REFLEX TO CULTURE (INFECTION SUSPECTED)
Glucose, UA: 100 mg/dL — AB
Ketones, ur: NEGATIVE mg/dL
Leukocytes,Ua: NEGATIVE
Nitrite: NEGATIVE
Protein, ur: 100 mg/dL — AB
Specific Gravity, Urine: >=1.03 (ref 1.005–1.030)
pH: 5.5 (ref 5.0–8.0)

## 2024-10-30 LAB — RESP PANEL BY RT-PCR (RSV, FLU A&B, COVID)  RVPGX2
Influenza A by PCR: NEGATIVE
Influenza B by PCR: NEGATIVE
Resp Syncytial Virus by PCR: NEGATIVE
SARS Coronavirus 2 by RT PCR: NEGATIVE

## 2024-10-30 MED ORDER — SODIUM CHLORIDE 0.9 % IV BOLUS
1000.0000 mL | Freq: Once | INTRAVENOUS | Status: AC
  Administered 2024-10-30: 1000 mL via INTRAVENOUS

## 2024-10-30 MED ORDER — OXYCODONE-ACETAMINOPHEN 5-325 MG PO TABS: 1.0000 | ORAL_TABLET | Freq: Four times a day (QID) | ORAL | 0 refills | Status: AC | PRN

## 2024-10-30 MED ORDER — MORPHINE SULFATE (PF) 4 MG/ML IV SOLN
4.0000 mg | Freq: Once | INTRAVENOUS | Status: AC
  Administered 2024-10-30: 4 mg via INTRAVENOUS
  Filled 2024-10-30: qty 1

## 2024-10-30 MED ORDER — ACETAMINOPHEN 325 MG PO TABS
650.0000 mg | ORAL_TABLET | Freq: Once | ORAL | Status: AC
  Administered 2024-10-30: 650 mg via ORAL
  Filled 2024-10-30: qty 2

## 2024-10-30 NOTE — Discharge Instructions (Addendum)
 As we discussed your CT scan showed lytic lesions in your spine.  This will likely cause ongoing pain  I have prescribed oxycodone  as needed for pain  Your kidney function is also abnormal but likely from dehydration  Please stay hydrated  You need to follow-up with oncology regarding your bone lesions.  You need to recheck your kidney function with your doctor in a week  Return to ER if you have severe back pain or flank pain or vomiting

## 2024-10-30 NOTE — ED Triage Notes (Signed)
 Pt with LT flank pain that started today; vomited x 1; sts this is unrelated to sxs he was seen for 3 days ago

## 2024-10-30 NOTE — ED Provider Notes (Signed)
 " [place] [facility] Provider Note   CSN: [id number] Arrival date & time: 10/30/24  1416     Patient presents with: Flank Pain   Darius Rivers is a 63 y.o. male history of hypertension, diabetes, metastatic prostate cancer here presenting with left flank pain and back pain.  Patient was seen in the ED 3 days ago and had CT abdomen pelvis that showed increased size sclerotic lesions of T12 and L3 and also sclerotic lesions of the left superior pubic ramus.  Patient states that he was not prescribed any pain medicine.  He states that he had worsening left flank pain and back pain today.  Patient also has some low-grade temperature.  Denies any chest pain or shortness of breath.  Denies any urinary symptoms.  Patient came back to the ER for further evaluation.   The history is provided by the patient.       Prior to Admission medications  Medication Sig Start Date End Date Taking? Authorizing Provider  aspirin  EC 81 MG tablet Take 81 mg by mouth daily.    [provider]  carvedilol  (COREG ) 6.25 MG tablet Take 1 tablet (6.25 mg total) by mouth 2 (two) times daily. 09/02/24   Colletta Manuelita Garre, PA-C  digoxin  (LANOXIN ) 0.125 MG tablet Take 1 tablet (0.125 mg total) by mouth daily. 09/02/24   Colletta Manuelita Garre, PA-C  empagliflozin  (JARDIANCE ) 10 MG TABS tablet Take 1 tablet (10 mg total) by mouth daily before breakfast. 07/26/24   Hayes Beckey CROME, NP  ezetimibe  (ZETIA ) 10 MG tablet TAKE 1 TABLET BY MOUTH DAILY 08/19/24   Lelon Hamilton T, PA-C  feeding supplement (ENSURE PLUS HIGH PROTEIN) LIQD Take 237 mLs by mouth 2 (two) times daily between meals. 04/19/24   Gonfa, Taye T, MD  prochlorperazine  (COMPAZINE ) 10 MG tablet Take 1 tablet (10 mg total) by mouth 2 (two) times daily as needed for nausea or vomiting. 08/01/24   Roselyn Carlin NOVAK, MD  rosuvastatin  (CRESTOR ) 40 MG tablet Take 1 tablet (40 mg total) by mouth daily at 6 PM. 12/08/22    Hobart Powell BRAVO, MD  sacubitril -valsartan  (ENTRESTO ) 97-103 MG Take 1 tablet by mouth 2 (two) times daily. 09/02/24   Colletta Manuelita Garre, PA-C  senna-docusate (SENOKOT-S) 8.6-50 MG tablet Take 1 tablet by mouth 2 (two) times daily as needed for moderate constipation. Patient not taking: Reported on 09/02/2024 04/19/24   Gonfa, Taye T, MD  spironolactone  (ALDACTONE ) 25 MG tablet Take 1 tablet (25 mg total) by mouth daily. 05/05/24 09/02/25  Glena Harlene HERO, FNP    Allergies: Patient has no known allergies.    Review of Systems  Genitourinary:  Positive for flank pain.  Musculoskeletal:  Positive for back pain.  All other systems reviewed and are negative.   Updated Vital Signs BP (!) 105/91 (BP Location: Right Arm)   Pulse 88   Temp 99.6 F (37.6 C)   Resp 16   Ht 6' 4.5 (1.943 m)   Wt 77.1 kg   SpO2 100%   BMI 20.42 kg/m   Physical Exam Vitals and nursing note reviewed.  Constitutional:      Appearance: Normal appearance.     Comments: Uncomfortable  HENT:     Head: Normocephalic.     Nose: Nose normal.     Mouth/Throat:     Mouth: Mucous membranes are moist.  Eyes:     Extraocular Movements: Extraocular movements intact.     Pupils: Pupils  are equal, round, and reactive to light.  Cardiovascular:     Rate and Rhythm: Normal rate and regular rhythm.     Pulses: Normal pulses.     Heart sounds: Normal heart sounds.  Pulmonary:     Effort: Pulmonary effort is normal.     Breath sounds: Normal breath sounds.  Abdominal:     General: Abdomen is flat.     Palpations: Abdomen is soft.  Musculoskeletal:     Cervical back: Normal range of motion.     Comments: Left para lumbar versus CVA tenderness  Skin:    General: Skin is warm.     Capillary Refill: Capillary refill takes less than 2 seconds.  Neurological:     General: No focal deficit present.     Mental Status: He is alert and oriented to person, place, and time.  Psychiatric:        Mood and Affect:  Mood normal.        Behavior: Behavior normal.     (all labs ordered are listed, but only abnormal results are displayed) Labs Reviewed  RESP PANEL BY RT-PCR (RSV, FLU A&B, COVID)  [id number]  CBC WITH DIFFERENTIAL/PLATELET  COMPREHENSIVE METABOLIC PANEL WITH GFR  URINALYSIS, W/ REFLEX TO CULTURE (INFECTION SUSPECTED)    EKG: None  Radiology: No results found.   Procedures   Medications Ordered in the ED  morphine  (PF) 4 MG/ML injection 4 mg (4 mg Intravenous Given 10/30/24 1537)  acetaminophen  (TYLENOL ) tablet 650 mg (650 mg Oral Given 10/30/24 1536)                                    Medical Decision Making Darius Rivers is a 63 y.o. male here presenting with left flank pain and back pain.  Patient recently had a CT scan that showed multiple spinal sclerotic lesions for metastatic cancer.  I think his pain is likely from metastatic cancer.  On the other and patient does not have any saddle anesthesia or neurodeficits so I do not think he needs a MRI currently.  I think patient's symptoms can be from metastatic cancer versus pyelonephritis versus gastroenteritis.  Plan to get CBC and CMP and UA and give pain medicine and IV fluids and reassess  7:21 PM Reviewed patient's labs and patient has an AKI with creatinine 1.9.  COVID flu and RSV negative.  Patient given IV fluids and pain medicine and felt better.  I notified him that he has metastatic cancer into his bones and he is likely going to have ongoing pain.  Will prescribe pain medicine.  Told him to follow-up with his oncologist   Problems Addressed: AKI (acute kidney injury): acute illness or injury  Amount and/or Complexity of Data Reviewed Labs: ordered. Decision-making details documented in ED Course.  Risk OTC drugs. Prescription drug management.     Final diagnoses:  None    ED Discharge Orders     None          Patt Alm Macho, MD 10/30/24 Darius Rivers  "

## 2024-10-30 NOTE — ED Notes (Signed)
 ..  The patient is A&OX4, ambulatory at d/c with independent steady gait, NAD. Pt verbalized understanding of d/c instructions, prescription and follow up care.
